# Patient Record
Sex: Female | Born: 1955 | Race: Black or African American | Hispanic: No | Marital: Married | Smoking: Current every day smoker
Health system: Southern US, Community
[De-identification: ages and names within clinical notes are randomized; demographics above are authoritative.]

## PROBLEM LIST (undated history)

## (undated) DIAGNOSIS — E039 Hypothyroidism, unspecified: Secondary | ICD-10-CM

## (undated) DIAGNOSIS — T7840XA Allergy, unspecified, initial encounter: Secondary | ICD-10-CM

## (undated) DIAGNOSIS — E079 Disorder of thyroid, unspecified: Secondary | ICD-10-CM

## (undated) DIAGNOSIS — E7800 Pure hypercholesterolemia, unspecified: Secondary | ICD-10-CM

## (undated) DIAGNOSIS — K219 Gastro-esophageal reflux disease without esophagitis: Secondary | ICD-10-CM

## (undated) DIAGNOSIS — Z9889 Other specified postprocedural states: Secondary | ICD-10-CM

## (undated) DIAGNOSIS — I10 Essential (primary) hypertension: Secondary | ICD-10-CM

## (undated) DIAGNOSIS — R112 Nausea with vomiting, unspecified: Secondary | ICD-10-CM

## (undated) DIAGNOSIS — M1990 Unspecified osteoarthritis, unspecified site: Secondary | ICD-10-CM

## (undated) HISTORY — PX: OTHER SURGICAL HISTORY: SHX169

## (undated) HISTORY — DX: Unspecified osteoarthritis, unspecified site: M19.90

## (undated) HISTORY — DX: Pure hypercholesterolemia, unspecified: E78.00

## (undated) HISTORY — DX: Essential (primary) hypertension: I10

## (undated) HISTORY — PX: BREAST SURGERY: SHX581

## (undated) HISTORY — PX: ABDOMINAL HYSTERECTOMY: SHX81

## (undated) HISTORY — DX: Gastro-esophageal reflux disease without esophagitis: K21.9

## (undated) HISTORY — PX: BACK SURGERY: SHX140

## (undated) HISTORY — DX: Allergy, unspecified, initial encounter: T78.40XA

## (undated) HISTORY — PX: TONSILLECTOMY: SHX5217

## (undated) HISTORY — DX: Disorder of thyroid, unspecified: E07.9

## (undated) HISTORY — PX: APPENDECTOMY: SHX54

---

## 1998-02-12 ENCOUNTER — Emergency Department (HOSPITAL_COMMUNITY): Admission: EM | Admit: 1998-02-12 | Discharge: 1998-02-12 | Payer: Self-pay | Admitting: Emergency Medicine

## 1998-02-26 ENCOUNTER — Inpatient Hospital Stay (HOSPITAL_COMMUNITY): Admission: RE | Admit: 1998-02-26 | Discharge: 1998-02-27 | Payer: Self-pay | Admitting: *Deleted

## 2006-11-08 ENCOUNTER — Observation Stay (HOSPITAL_COMMUNITY): Admission: EM | Admit: 2006-11-08 | Discharge: 2006-11-09 | Payer: Self-pay | Admitting: Emergency Medicine

## 2006-11-16 ENCOUNTER — Ambulatory Visit: Payer: Self-pay

## 2007-04-17 ENCOUNTER — Emergency Department (HOSPITAL_COMMUNITY): Admission: EM | Admit: 2007-04-17 | Discharge: 2007-04-17 | Payer: Self-pay | Admitting: Emergency Medicine

## 2010-11-27 ENCOUNTER — Other Ambulatory Visit: Payer: Self-pay | Admitting: Family Medicine

## 2010-11-27 ENCOUNTER — Ambulatory Visit
Admission: RE | Admit: 2010-11-27 | Discharge: 2010-11-27 | Disposition: A | Discharge status: Home / Self Care | Payer: Managed Care, Other (non HMO) | Source: Ambulatory Visit | Attending: Family Medicine | Admitting: Family Medicine

## 2011-03-13 NOTE — H&P (Signed)
NAME:  KERIE, BADGER NO.:  [id number]   MEDICAL RECORD NO.:  [id number]          PATIENT TYPE:  EMS   LOCATION:  [facility]                         FACILITY:  [facility]   PHYSICIAN:  Mobolaji B. Bakare, M.D.DATE OF BIRTH:  1956/01/29   DATE OF ADMISSION:  11/08/2006  DATE OF DISCHARGE:                              HISTORY & PHYSICAL   PRIMARY CARE PHYSICIAN:  Unassigned.   CHIEF COMPLAINT:  Intermittent chest pain for 1 month.   HISTORY OF PRESENT ILLNESS:  Ms. Chrisandra Carota is a pleasant 55 year old  African American female who has been having intermittent right-sided  chest pain for 1 month which is aggravated by body movement and cough.  These symptoms were preceded by upper respiratory symptoms and she has  been coughing since.  Cough is unproductive of sputum.  She has  pleuritic chest pain on coughing.  Yesterday, pain got worse and she  felt diaphoretic and nauseated, somewhat short of breath and she came to  the emergency room.  On evaluation, her cardiac markers at the point of  care were negative and she had a normal EKG.  She has hypertension and  history of ongoing tobacco abuse; hence, she will be admitted to rule  out myocardial ischemia and further risk stratification.   REVIEW OF SYSTEMS:  There is no abdominal pain, diarrhea, vomiting or  constipation, no dysuria, urgency or increased frequency of micturition.  She has no headaches.   PAST MEDICAL HISTORY:  1. Hypertension.  2. Back pain with herniated disk in 1999.   PAST SURGICAL HISTORY:   MEDICATIONS:  Hydrochlorothiazide; the patient is unsure of the dose.   ALLERGIES:  DARVOCET-N causes vomiting.   FAMILY HISTORY:  Mother passed away from massive heart attack at the age  of 48 and grandmother passed away from a massive heart attack at the age  of 39.   SOCIAL HISTORY:  She smokes cigarettes about 6 cigarrette a day, but  after meals.  She does not drink alcohol.  She is married and has 1  son.   PHYSICAL EXAMINATION:  INITIAL VITALS:  Temperature 97.7, blood pressure  151/88, pulse of 76, respiratory rate of 20 and O2 SATs of 98%.  GENERAL:  On examination, the patient is comfortable, not in respiratory  distress.  HEENT:  Normocephalic, atraumatic head.  Pupils are equal, round and  reactive to light.  Extraocular muscle movement intact.  NECK:  No elevated JVD.  No carotid bruit.  LUNGS:  Clear clinically to auscultation.  CVS:  S1 and S2, regular.  No murmur.  No gallop.  ABDOMEN:  Not distended, soft and nontender.  EXTREMITIES:  No pedal edema or calf tenderness.  Dorsalis pedis pulses  palpable bilaterally.  MUSCULOSKELETAL:  There is reproducible chest wall tenderness over the  right chest wall.  CNS:  No focal neurologic deficit.   ACCESSORY CLINICAL DATA:  EKG shows normal sinus rhythm with heart rate  of 83, nonspecific ST changes.   LABORATORY DATA:  Sodium 134, potassium 3.4, chloride 103, bicarb 24,  glucose 151, BUN 14, creatinine 1.0, calcium 8.8.  Cardiac markers at  the point of care:  Troponin less than 0.05, myoglobin of 161.   RADIOLOGIC FINDINGS:  Chest x-ray showed borderline cardiomegaly without  any acute cardiopulmonary disease.   ASSESSMENT AND PLAN:  Ms. Chrisandra Carota is a 55 year old Caucasian female  presenting with right-sided chest pain which has been ongoing for 1  month.  It became more aggravating yesterday.  This was preceded by  upper respiratory symptoms and cough.  Pain is reproducible on the right  side of chest wall.  EKG is nonspecific.  The patient is hypertensive  and has ongoing tobacco abuse.  She will be admitted to rule out  myocardial infarction and further risk stratification.   ADMISSION DIAGNOSES:  1. Atypical chest pain.  2. Tobacco abuse.  3. Hypertension.   PLAN:  Admit to Telemetry.  Serial cardiac enzymes and cardiac panel q.8  h. x3.  Check D-dimer and if positive, will obtain CT angiogram of  chest.  We  will resume hydrochlorothiazide 25 mg p.o. daily, Vicodin one  to two p.o. q.4 h. p.r.n. pain, aspirin 325 mg p.o. daily and sublingual  nitroglycerin p.r.n.  I am holding off beta blocker at this point, as  the patient's heart rate is in the range of mid 60s.  We will offer  tobacco cessation counseling.  Mucinex 600 mg p.o. b.i.d.      Mobolaji B. Corky Downs, M.D.  Electronically Signed     MBB/MEDQ  D:  11/08/2006  T:  11/08/2006  Job:  [id number]

## 2011-08-12 LAB — COMPREHENSIVE METABOLIC PANEL
ALT: 17
AST: 28
Albumin: 4.5
Alkaline Phosphatase: 46
BUN: 10
CO2: 26
Calcium: 9.8
Chloride: 103
Creatinine, Ser: 1.16
GFR calc Af Amer: 60 — ABNORMAL LOW
GFR calc non Af Amer: 49 — ABNORMAL LOW
Glucose, Bld: 131 — ABNORMAL HIGH
Potassium: 3.3 — ABNORMAL LOW
Sodium: 140
Total Bilirubin: 0.9
Total Protein: 7.8

## 2011-08-12 LAB — URINALYSIS, ROUTINE W REFLEX MICROSCOPIC
Glucose, UA: NEGATIVE
Ketones, ur: 15 — AB
Nitrite: NEGATIVE
Protein, ur: 100 — AB
Specific Gravity, Urine: 1.018
Urobilinogen, UA: 1
pH: 6

## 2011-08-12 LAB — DIFFERENTIAL
Basophils Absolute: 0
Basophils Relative: 0
Eosinophils Absolute: 0.1
Eosinophils Relative: 1
Lymphocytes Relative: 9 — ABNORMAL LOW
Lymphs Abs: 0.9
Monocytes Absolute: 0.5
Monocytes Relative: 5
Neutro Abs: 9.4 — ABNORMAL HIGH
Neutrophils Relative %: 86 — ABNORMAL HIGH

## 2011-08-12 LAB — CBC
HCT: 41.9
Hemoglobin: 13.9
MCHC: 33.2
MCV: 80.7
Platelets: 278
RBC: 5.19 — ABNORMAL HIGH
RDW: 13.9
WBC: 10.9 — ABNORMAL HIGH

## 2011-08-12 LAB — URINE MICROSCOPIC-ADD ON

## 2011-12-30 ENCOUNTER — Ambulatory Visit: Payer: Self-pay | Admitting: Family Medicine

## 2012-02-23 ENCOUNTER — Encounter: Payer: Self-pay | Admitting: Family Medicine

## 2012-02-23 ENCOUNTER — Ambulatory Visit (INDEPENDENT_AMBULATORY_CARE_PROVIDER_SITE_OTHER): Payer: Managed Care, Other (non HMO) | Admitting: Family Medicine

## 2012-02-23 VITALS — BP 159/85 | HR 74 | Temp 98.9°F | Resp 16 | Ht 65.0 in | Wt 206.8 lb

## 2012-02-23 DIAGNOSIS — E669 Obesity, unspecified: Secondary | ICD-10-CM

## 2012-02-23 DIAGNOSIS — R5381 Other malaise: Secondary | ICD-10-CM

## 2012-02-23 DIAGNOSIS — E559 Vitamin D deficiency, unspecified: Secondary | ICD-10-CM

## 2012-02-23 DIAGNOSIS — M5126 Other intervertebral disc displacement, lumbar region: Secondary | ICD-10-CM

## 2012-02-23 DIAGNOSIS — F418 Other specified anxiety disorders: Secondary | ICD-10-CM

## 2012-02-23 DIAGNOSIS — M5136 Other intervertebral disc degeneration, lumbar region: Secondary | ICD-10-CM

## 2012-02-23 DIAGNOSIS — E785 Hyperlipidemia, unspecified: Secondary | ICD-10-CM

## 2012-02-23 DIAGNOSIS — J309 Allergic rhinitis, unspecified: Secondary | ICD-10-CM

## 2012-02-23 DIAGNOSIS — I10 Essential (primary) hypertension: Secondary | ICD-10-CM

## 2012-02-23 DIAGNOSIS — K219 Gastro-esophageal reflux disease without esophagitis: Secondary | ICD-10-CM

## 2012-02-23 DIAGNOSIS — E039 Hypothyroidism, unspecified: Secondary | ICD-10-CM

## 2012-02-23 DIAGNOSIS — R5383 Other fatigue: Secondary | ICD-10-CM

## 2012-02-23 LAB — LDL CHOLESTEROL, DIRECT: Direct LDL: 157 mg/dL — ABNORMAL HIGH

## 2012-02-23 LAB — TSH: TSH: 7.969 u[IU]/mL — ABNORMAL HIGH (ref 0.350–4.500)

## 2012-02-23 MED ORDER — LISINOPRIL-HYDROCHLOROTHIAZIDE 10-12.5 MG PO TABS: 1.0000 | ORAL_TABLET | Freq: Every day | ORAL | Status: DC

## 2012-02-23 MED ORDER — LEVOTHYROXINE SODIUM 50 MCG PO TABS: 50.0000 ug | ORAL_TABLET | Freq: Every day | ORAL | Status: DC

## 2012-02-23 MED ORDER — OMEPRAZOLE 20 MG PO CPDR: 20.0000 mg | DELAYED_RELEASE_CAPSULE | Freq: Every day | ORAL | Status: DC

## 2012-02-23 NOTE — Progress Notes (Signed)
  Subjective:    Patient ID: Erika Cervantes, female    DOB: [birth year], 56 y.o.   MRN: [id number]  HPI  This pleasant AA female has HTN and is out of medication since January 2013. Significant  stress in personal life and found out that adult son has Type II Diabetes. She cares for her elderly  aunt  (age 21) who has Down's Syndrome and  Dementia. She has little help from family on a  consistent basis. She c/o sinus drainage and is taking Claritin-D for last week; has a lot of AM phlegm  with cough and sinus congestion.    Review of Systems  Constitutional: Positive for fatigue. Negative for fever and chills.       Not sleeping well- < 7 hours sleep most nights  HENT: Positive for congestion, sore throat, postnasal drip and sinus pressure. Negative for ear pain and nosebleeds.   Eyes: Positive for discharge.  Respiratory: Positive for cough. Negative for choking, chest tightness and wheezing.   Cardiovascular: Negative for chest pain and palpitations.  Neurological: Positive for headaches. Negative for dizziness, syncope and weakness.  Psychiatric/Behavioral: Positive for sleep disturbance.       Objective:   Physical Exam  Nursing note and vitals reviewed. Constitutional: She is oriented to person, place, and time. She appears well-developed and well-nourished.  HENT:  Head: Normocephalic and atraumatic.  Right Ear: External ear normal.  Left Ear: External ear normal.  Nose: Nose normal.  Mouth/Throat: Oropharynx is clear and moist.       Posterior pharynx with cobblestoning and redness. SInuses nontender with percussion  Eyes: EOM are normal. Right eye exhibits no discharge. Left eye exhibits no discharge. No scleral icterus.       Conj injected  Neck: Normal range of motion. Neck supple.  Cardiovascular: Normal rate, regular rhythm and normal heart sounds.  Exam reveals no gallop.   No murmur heard. Pulmonary/Chest: Effort normal and breath sounds normal. No respiratory  distress. She has no wheezes.  Musculoskeletal:       Lower ext: trace edema  Lymphadenopathy:    She has cervical adenopathy.  Neurological: She is alert and oriented to person, place, and time. No cranial nerve deficit.  Skin: Skin is warm and dry.  Psychiatric: Her behavior is normal. Thought content normal. Her mood appears not anxious. Cognition and memory are normal. She exhibits a depressed mood.          Assessment & Plan:   1. Hypothyroidism  TSH; WU:[id number]  2. Fatigue  Vitamin D, 25-hydroxy  3. Hyperlipidemia with target LDL less than 100  LDL Cholesterol, Direct  4. Allergic rhinitis due to allergen    5. HTN (hypertension)  RF: Lisinopril/HCTZ

## 2012-02-23 NOTE — Patient Instructions (Signed)
Allergic Rhinitis  Allergic rhinitis is when the mucous membranes in the nose respond to allergens. Allergens are particles in the air that cause your body to have an allergic reaction. This causes you to release allergic antibodies. Through a chain of events, these eventually cause you to release histamine into the blood stream (hence the use of antihistamines). Although meant to be protective to the body, it is this release that causes your discomfort, such as frequent sneezing, congestion and an itchy runny nose.    CAUSES    The pollen allergens may come from grasses, trees, and weeds. This is seasonal allergic rhinitis, or "hay fever." Other allergens cause year-round allergic rhinitis (perennial allergic rhinitis) such as house dust mite allergen, pet dander and mold spores.    SYMPTOMS     Nasal stuffiness (congestion).   Runny, itchy nose with sneezing and tearing of the eyes.   There is often an itching of the mouth, eyes and ears.  It cannot be cured, but it can be controlled with medications.  DIAGNOSIS    If you are unable to determine the offending allergen, skin or blood testing may find it.  TREATMENT     Avoid the allergen.   Medications and allergy shots (immunotherapy) can help.   Hay fever may often be treated with antihistamines in pill or nasal spray forms. Antihistamines block the effects of histamine. There are over-the-counter medicines that may help with nasal congestion and swelling around the eyes. Check with your caregiver before taking or giving this medicine.  If the treatment above does not work, there are many new medications your caregiver can prescribe. Stronger medications may be used if initial measures are ineffective. Desensitizing injections can be used if medications and avoidance fails. Desensitization is when a patient is given ongoing shots until the body becomes less sensitive to the allergen. Make sure you follow up with your caregiver if problems continue.  SEEK  MEDICAL CARE IF:     You develop fever (more than 100.5 F (38.1 C).   You develop a cough that does not stop easily (persistent).   You have shortness of breath.   You start wheezing.   Symptoms interfere with normal daily activities.  Document Released: 07/07/2001 Document Revised: 10/01/2011 Document Reviewed: 01/16/2009  Exit[employer] Patient Information 2012 [employer].

## 2012-02-24 LAB — VITAMIN D 25 HYDROXY (VIT D DEFICIENCY, FRACTURES): Vit D, 25-Hydroxy: 13 ng/mL — ABNORMAL LOW (ref 30–89)

## 2012-02-26 ENCOUNTER — Encounter: Payer: Self-pay | Admitting: Family Medicine

## 2012-02-26 DIAGNOSIS — E669 Obesity, unspecified: Secondary | ICD-10-CM | POA: Insufficient documentation

## 2012-02-26 DIAGNOSIS — E785 Hyperlipidemia, unspecified: Secondary | ICD-10-CM | POA: Insufficient documentation

## 2012-02-26 DIAGNOSIS — F418 Other specified anxiety disorders: Secondary | ICD-10-CM | POA: Insufficient documentation

## 2012-02-26 DIAGNOSIS — E039 Hypothyroidism, unspecified: Secondary | ICD-10-CM | POA: Insufficient documentation

## 2012-02-26 DIAGNOSIS — M5126 Other intervertebral disc displacement, lumbar region: Secondary | ICD-10-CM | POA: Insufficient documentation

## 2012-02-26 DIAGNOSIS — I10 Essential (primary) hypertension: Secondary | ICD-10-CM | POA: Insufficient documentation

## 2012-02-26 DIAGNOSIS — M5136 Other intervertebral disc degeneration, lumbar region: Secondary | ICD-10-CM | POA: Insufficient documentation

## 2012-02-26 DIAGNOSIS — K219 Gastro-esophageal reflux disease without esophagitis: Secondary | ICD-10-CM | POA: Insufficient documentation

## 2012-02-26 DIAGNOSIS — J309 Allergic rhinitis, unspecified: Secondary | ICD-10-CM | POA: Insufficient documentation

## 2012-02-27 ENCOUNTER — Other Ambulatory Visit: Payer: Self-pay | Admitting: Family Medicine

## 2012-02-27 MED ORDER — ERGOCALCIFEROL 1.25 MG (50000 UT) PO CAPS: 50000.0000 [IU] | ORAL_CAPSULE | ORAL | Status: DC

## 2012-02-27 NOTE — Progress Notes (Signed)
Quick Note:  Please call pt and advise that the following labs are abnormal...  Thyroid test was abnormal but you had not been taking your med for 3 months; now that you have your medication, take it daily and you should feel better. Thyroid tests will be checked at your next visit. I will add additional refills for your medication so you do not run out!    You have a low Vitamin D level and will need to take a supplement to get it up to normal range. This med is at your pharmacy. Take the Vitamin D capsule once a week ( be sure pt understands this). This will also be rechecked at your next visit. LDL("bad") cholesterol is too high; try to eat healthier- no fried foods, limit high fat foods and processed foods and eat more fruits and vegetables.  Copt to pt. ______

## 2012-03-09 ENCOUNTER — Encounter: Payer: Self-pay | Admitting: Family Medicine

## 2012-05-24 ENCOUNTER — Ambulatory Visit: Payer: Managed Care, Other (non HMO) | Admitting: Family Medicine

## 2012-05-26 ENCOUNTER — Ambulatory Visit (INDEPENDENT_AMBULATORY_CARE_PROVIDER_SITE_OTHER): Payer: Managed Care, Other (non HMO) | Admitting: Family Medicine

## 2012-05-26 ENCOUNTER — Encounter: Payer: Self-pay | Admitting: Family Medicine

## 2012-05-26 VITALS — BP 133/79 | HR 91 | Temp 97.7°F | Resp 16 | Ht 65.0 in | Wt 207.0 lb

## 2012-05-26 DIAGNOSIS — M2550 Pain in unspecified joint: Secondary | ICD-10-CM

## 2012-05-26 DIAGNOSIS — E039 Hypothyroidism, unspecified: Secondary | ICD-10-CM

## 2012-05-26 DIAGNOSIS — E559 Vitamin D deficiency, unspecified: Secondary | ICD-10-CM

## 2012-05-26 DIAGNOSIS — Z23 Encounter for immunization: Secondary | ICD-10-CM

## 2012-05-26 DIAGNOSIS — I10 Essential (primary) hypertension: Secondary | ICD-10-CM

## 2012-05-26 DIAGNOSIS — R5381 Other malaise: Secondary | ICD-10-CM

## 2012-05-26 DIAGNOSIS — R5383 Other fatigue: Secondary | ICD-10-CM

## 2012-05-26 LAB — BASIC METABOLIC PANEL
BUN: 14 mg/dL (ref 6–23)
CO2: 24 mEq/L (ref 19–32)
Calcium: 10.1 mg/dL (ref 8.4–10.5)
Chloride: 102 mEq/L (ref 96–112)
Creat: 1.04 mg/dL (ref 0.50–1.10)
Glucose, Bld: 91 mg/dL (ref 70–99)
Potassium: 4.1 mEq/L (ref 3.5–5.3)
Sodium: 136 mEq/L (ref 135–145)

## 2012-05-26 LAB — CBC WITH DIFFERENTIAL/PLATELET
Basophils Absolute: 0.1 10*3/uL (ref 0.0–0.1)
Basophils Relative: 1 % (ref 0–1)
Eosinophils Absolute: 0.3 10*3/uL (ref 0.0–0.7)
Eosinophils Relative: 4 % (ref 0–5)
HCT: 36.1 % (ref 36.0–46.0)
Hemoglobin: 12.6 g/dL (ref 12.0–15.0)
Lymphocytes Relative: 39 % (ref 12–46)
Lymphs Abs: 2.7 10*3/uL (ref 0.7–4.0)
MCH: 26.8 pg (ref 26.0–34.0)
MCHC: 34.9 g/dL (ref 30.0–36.0)
MCV: 76.6 fL — ABNORMAL LOW (ref 78.0–100.0)
Monocytes Absolute: 0.4 10*3/uL (ref 0.1–1.0)
Monocytes Relative: 6 % (ref 3–12)
Neutro Abs: 3.4 10*3/uL (ref 1.7–7.7)
Neutrophils Relative %: 50 % (ref 43–77)
Platelets: 314 10*3/uL (ref 150–400)
RBC: 4.71 MIL/uL (ref 3.87–5.11)
RDW: 14.3 % (ref 11.5–15.5)
WBC: 6.8 10*3/uL (ref 4.0–10.5)

## 2012-05-26 LAB — TSH: TSH: 5.085 u[IU]/mL — ABNORMAL HIGH (ref 0.350–4.500)

## 2012-05-26 NOTE — Patient Instructions (Addendum)

## 2012-05-26 NOTE — Progress Notes (Signed)
  Subjective:    Patient ID: Erika Cervantes, female    DOB: 27-Apr-1956, 56 y.o.   MRN: [id number]  HPI This 56 y.o. AA female has thyroid disorder, on replacement and HTN as well as chronic joint  pain and LBP (bulging disc). She is compliant with meds without side effects.    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  She still has some sleep disturbance but denies fever, unexplained weight loss or gain,  CP or heaviness, SOB, cough,  generalized myalgias, edema or weakness. She notes  subcutaneous skin nodules on her legs only. She c/o decreased/ absent libido.    Cardiac risk factors: Postmenopausal female, hyperlipidemia, former smoker, HTN,    Family history -mother died of massive MI             Review of Systems  Constitutional: Positive for fatigue. Negative for fever, appetite change and unexpected weight change.  HENT: Positive for neck stiffness. Negative for sore throat and trouble swallowing.   Eyes: Negative for visual disturbance.  Respiratory: Negative.   Cardiovascular: Negative.   Musculoskeletal: Positive for back pain. Negative for myalgias, joint swelling and arthralgias.  Neurological: Negative.   Psychiatric/Behavioral: Positive for disturbed wake/sleep cycle. The patient is not nervous/anxious.        Objective:   Physical Exam  Nursing note and vitals reviewed. Constitutional: She is oriented to person, place, and time. She appears well-developed and well-nourished. No distress.  HENT:  Head: Normocephalic and atraumatic.  Eyes: Conjunctivae and EOM are normal. No scleral icterus.  Cardiovascular: Normal rate, regular rhythm and normal heart sounds.  Exam reveals no gallop and no friction rub.   No murmur heard. Pulmonary/Chest: Effort normal and breath sounds normal. No respiratory distress. She has no wheezes.  Musculoskeletal: She exhibits no edema and no tenderness.  Neurological: She is alert and oriented  to person, place, and time. No cranial nerve deficit.  Skin: Skin is warm and dry.       Lower ext: soft 1-1.5 cm subcutaneous nodules, no ulcerations  Psychiatric:       Flat affect, depressed mood   ECG 04/08/2012) reviewed: Normal variant     Assessment & Plan:   1. Unspecified hypothyroidism  TSH Continue Current dose Levothyroxine  2. Unspecified vitamin D deficiency  Vitamin D, 25-hydroxy Continue Vit D 50,000 IU supplement  3. Joint pain / subcutaneous nodules Rheumatoid factor  4. HTN (hypertension)  Basic metabolic panel; Continue current medication  5. Fatigue - mild anemia in Feb 2012 Suspect thyroid under-supplementation CBC with Differential  6. Need for prophylactic vaccination with combined diphtheria-tetanus-pertussis (DTP) vaccine  Tdap vaccine greater than or equal to 7yo IM   Pt has been advised that if fatigue persists, a cardiac evaluation will be considered.

## 2012-05-27 LAB — RHEUMATOID FACTOR: Rhuematoid fact SerPl-aCnc: <10 IU/mL (ref <=14)

## 2012-05-27 LAB — VITAMIN D 25 HYDROXY (VIT D DEFICIENCY, FRACTURES): Vit D, 25-Hydroxy: 39 ng/mL (ref 30–89)

## 2012-05-30 NOTE — Progress Notes (Signed)
Quick Note:  Please call pt and advise that the following labs are abnormal... Thyroid result is close to normal; no need to change medication at this time. I might recheck you thyroid test at next visit. Vitamin D level is now in the low normal range. Continue your Vitamin D supplement. Other labs are normal. See you in 2 months.  Copy to pt. ______

## 2012-07-13 ENCOUNTER — Other Ambulatory Visit: Payer: Self-pay | Admitting: Family Medicine

## 2012-07-28 ENCOUNTER — Ambulatory Visit: Payer: Managed Care, Other (non HMO) | Admitting: Family Medicine

## 2012-09-24 ENCOUNTER — Ambulatory Visit (INDEPENDENT_AMBULATORY_CARE_PROVIDER_SITE_OTHER): Payer: Managed Care, Other (non HMO) | Admitting: Emergency Medicine

## 2012-09-24 VITALS — BP 115/72 | HR 71 | Temp 98.1°F | Resp 16 | Ht 66.5 in | Wt 218.0 lb

## 2012-09-24 DIAGNOSIS — J209 Acute bronchitis, unspecified: Secondary | ICD-10-CM

## 2012-09-24 DIAGNOSIS — J018 Other acute sinusitis: Secondary | ICD-10-CM

## 2012-09-24 DIAGNOSIS — I10 Essential (primary) hypertension: Secondary | ICD-10-CM

## 2012-09-24 LAB — BASIC METABOLIC PANEL
BUN: 15 mg/dL (ref 6–23)
CO2: 25 mEq/L (ref 19–32)
Calcium: 9.7 mg/dL (ref 8.4–10.5)
Chloride: 106 mEq/L (ref 96–112)
Creat: 1.03 mg/dL (ref 0.50–1.10)
Glucose, Bld: 91 mg/dL (ref 70–99)
Potassium: 4.5 mEq/L (ref 3.5–5.3)
Sodium: 139 mEq/L (ref 135–145)

## 2012-09-24 MED ORDER — AMOXICILLIN-POT CLAVULANATE 875-125 MG PO TABS: 1.0000 | ORAL_TABLET | Freq: Two times a day (BID) | ORAL | Status: DC

## 2012-09-24 MED ORDER — HYDROCOD POLST-CHLORPHEN POLST 10-8 MG/5ML PO LQCR: 5.0000 mL | Freq: Two times a day (BID) | ORAL | Status: DC | PRN

## 2012-09-24 MED ORDER — PSEUDOEPHEDRINE-GUAIFENESIN ER 60-600 MG PO TB12: 1.0000 | ORAL_TABLET | Freq: Two times a day (BID) | ORAL | Status: AC

## 2012-09-24 NOTE — Progress Notes (Signed)
[facility] [address] [place] [address] [phone]  Date:  09/24/2012   Name:  Erika Cervantes   DOB:  1955-10-29   MRN:  [id number]  PCP:  No primary provider on file.    Chief Complaint: Nasal Congestion and Numbness   History of Present Illness:  Erika Cervantes is a 56 y.o. very pleasant female patient who presents with the following:  Ill with a "cold" for two weeks.  The end of last week developed increased nasal congestion and a productive cough with purulent thick sputum.  Has mucoid nasal drainage and post nasal drip and nasal congestion.  No fever or chills, says she wheezes at night.  Cough is worse at night.  No shortness of breath, nausea or vomiting.  No stool change.    Two days ago started to experience cramps in her legs.  On low dose of HCTZ.  No edema or history of injury or overuse.   Patient Active Problem List  Diagnosis  . Hypothyroidism  . Hyperlipidemia with target LDL less than 100  . Allergic rhinitis due to allergen  . HTN (hypertension)  . Obesity (BMI 30.0-34.9)  . Situational anxiety  . Bulging lumbar disc  . GERD (gastroesophageal reflux disease)  . Vitamin d deficiency    No past medical history on file.  Past Surgical History  Procedure Date  . Colon surgery   . Tonsillectomy   . Abdominal hysterectomy     Fibroids  . Breast surgery     Right breast mass  . Hammer toes     History  Substance Use Topics  . Smoking status: Former Games developer  . Smokeless tobacco: Not on file  . Alcohol Use: No    Family History  Problem Relation Age of Onset  . Heart disease Mother     Allergies  Allergen Reactions  . Darvocet (Propoxyphene-Acetaminophen)     vomit    Medication list has been reviewed and updated.  Current Outpatient Prescriptions on File Prior to Visit  Medication Sig Dispense Refill  . levothyroxine (SYNTHROID, LEVOTHROID) 50 MCG tablet TAKE 1 TABLET (50 MCG TOTAL) BY MOUTH DAILY.  30 tablet  1   . lisinopril-hydrochlorothiazide (PRINZIDE,ZESTORETIC) 10-12.5 MG per tablet Take 1 tablet by mouth daily.  90 tablet  3  . omeprazole (PRILOSEC) 20 MG capsule Take 1 capsule (20 mg total) by mouth daily.  30 capsule  5  . Vitamin D, Ergocalciferol, (DRISDOL) 50000 UNITS CAPS TAKE 1 CAPSULE (50,000 UNITS TOTAL) BY MOUTH ONCE A WEEK.  4 capsule  1    Review of Systems:  As per HPI, otherwise negative.     Physical Examination: Filed Vitals:   09/24/12 1333  BP: 115/72  Pulse: 71  Temp: 98.1 F (36.7 C)  Resp: 16   Filed Vitals:   09/24/12 1333  Height: 5' 6.5" (1.689 m)  Weight: 218 lb (98.884 kg)   Body mass index is 34.66 kg/(m^2). Ideal Body Weight: Weight in (lb) to have BMI = 25: 156.9  GEN: obese, NAD, Non-toxic, A & O x 3  Marked nasal congestion.  No shortness of breath or rash HEENT: Atraumatic, Normocephalic. Neck supple. No masses, No LAD.  Oropharynx negative Ears and Nose: No external deformity.  TM negative CV: RRR, No M/G/R. No JVD. No thrill. No extra heart sounds. PULM: CTA B, no wheezes, crackles, rhonchi. No retractions. No resp. distress. No accessory muscle use. ABD: S, NT, ND, +BS.  No rebound. No HSM. EXTR: No c/c/e NEURO Normal gait.  PSYCH: Normally interactive. Conversant. Not depressed or anxious appearing.  Calm demeanor.    Assessment and Plan: Sinusitis Bronchitis augmentin mucinex d tussionex Follow up as needed  Carmelina Dane, MD

## 2013-01-08 NOTE — Patient Instructions (Addendum)

## 2013-01-18 NOTE — Progress Notes (Signed)
Unable to close this chart  due to smart set still being open

## 2013-01-18 NOTE — Progress Notes (Signed)
Reviewed and agree.

## 2013-02-25 ENCOUNTER — Ambulatory Visit: Payer: Managed Care, Other (non HMO)

## 2013-02-25 ENCOUNTER — Ambulatory Visit (INDEPENDENT_AMBULATORY_CARE_PROVIDER_SITE_OTHER): Payer: Managed Care, Other (non HMO) | Admitting: Family Medicine

## 2013-02-25 VITALS — BP 160/82 | HR 107 | Temp 98.8°F | Resp 16 | Ht 66.0 in | Wt 238.0 lb

## 2013-02-25 DIAGNOSIS — R05 Cough: Secondary | ICD-10-CM

## 2013-02-25 DIAGNOSIS — R062 Wheezing: Secondary | ICD-10-CM

## 2013-02-25 DIAGNOSIS — J209 Acute bronchitis, unspecified: Secondary | ICD-10-CM

## 2013-02-25 DIAGNOSIS — I10 Essential (primary) hypertension: Secondary | ICD-10-CM

## 2013-02-25 DIAGNOSIS — R059 Cough, unspecified: Secondary | ICD-10-CM

## 2013-02-25 DIAGNOSIS — K219 Gastro-esophageal reflux disease without esophagitis: Secondary | ICD-10-CM

## 2013-02-25 LAB — POCT CBC
Granulocyte percent: 66.2 %G (ref 37–80)
HCT, POC: 38.7 % (ref 37.7–47.9)
Hemoglobin: 12.3 g/dL (ref 12.2–16.2)
Lymph, poc: 2.1 (ref 0.6–3.4)
MCH, POC: 26.7 pg — AB (ref 27–31.2)
MCHC: 31.8 g/dL (ref 31.8–35.4)
MCV: 84 fL (ref 80–97)
MID (cbc): 0.6 (ref 0–0.9)
MPV: 9.9 fL (ref 0–99.8)
POC Granulocyte: 5.4 (ref 2–6.9)
POC LYMPH PERCENT: 25.9 %L (ref 10–50)
POC MID %: 7.9 %M (ref 0–12)
Platelet Count, POC: 307 10*3/uL (ref 142–424)
RBC: 4.61 M/uL (ref 4.04–5.48)
RDW, POC: 14.8 %
WBC: 8.2 10*3/uL (ref 4.6–10.2)

## 2013-02-25 LAB — COMPREHENSIVE METABOLIC PANEL
ALT: 9 U/L (ref 0–35)
AST: 17 U/L (ref 0–37)
Albumin: 4.5 g/dL (ref 3.5–5.2)
Alkaline Phosphatase: 42 U/L (ref 39–117)
BUN: 10 mg/dL (ref 6–23)
CO2: 25 mEq/L (ref 19–32)
Calcium: 9.9 mg/dL (ref 8.4–10.5)
Chloride: 105 mEq/L (ref 96–112)
Creat: 1.18 mg/dL — ABNORMAL HIGH (ref 0.50–1.10)
Glucose, Bld: 104 mg/dL — ABNORMAL HIGH (ref 70–99)
Potassium: 4 mEq/L (ref 3.5–5.3)
Sodium: 138 mEq/L (ref 135–145)
Total Bilirubin: 0.4 mg/dL (ref 0.3–1.2)
Total Protein: 8.1 g/dL (ref 6.0–8.3)

## 2013-02-25 MED ORDER — IPRATROPIUM BROMIDE 0.02 % IN SOLN
0.5000 mg | Freq: Once | RESPIRATORY_TRACT | Status: AC
  Administered 2013-02-25: 0.5 mg via RESPIRATORY_TRACT

## 2013-02-25 MED ORDER — ALBUTEROL SULFATE (2.5 MG/3ML) 0.083% IN NEBU
2.5000 mg | INHALATION_SOLUTION | Freq: Once | RESPIRATORY_TRACT | Status: AC
  Administered 2013-02-25: 2.5 mg via RESPIRATORY_TRACT

## 2013-02-25 MED ORDER — METHYLPREDNISOLONE 4 MG PO KIT: PACK | ORAL | Status: DC

## 2013-02-25 MED ORDER — HYDROCODONE-HOMATROPINE 5-1.5 MG/5ML PO SYRP: 5.0000 mL | ORAL_SOLUTION | Freq: Every evening | ORAL | Status: DC | PRN

## 2013-02-25 MED ORDER — AZITHROMYCIN 250 MG PO TABS: ORAL_TABLET | ORAL | Status: DC

## 2013-02-25 MED ORDER — IPRATROPIUM-ALBUTEROL 20-100 MCG/ACT IN AERS: 1.0000 | INHALATION_SPRAY | Freq: Four times a day (QID) | RESPIRATORY_TRACT | Status: DC | PRN

## 2013-02-25 MED ORDER — OMEPRAZOLE 20 MG PO CPDR: 20.0000 mg | DELAYED_RELEASE_CAPSULE | Freq: Every day | ORAL | Status: DC

## 2013-02-25 NOTE — Progress Notes (Signed)
[facility]:  Office Visit  Chief Complaint:  Chief Complaint  Patient presents with  . Cough  . Nasal Congestion  . Otalgia  . Headache  . Laryngitis    HPI: Erika Cervantes is a 57 y.o. female who complains of  1 week history of coughing, intermittent  Yellow/white sputum production. She took Mucinex D without relief. Has had allergies. Sore throat. Denies fevers, chills. Nonsmoker. Used to smoke. She has wheezing. Has been at [facility] for goddaughter who was in ICU on and on since for last 3 months. Not UTD on flu or PNA vaccines  HTN-doing well. Has been taking her meds more regular. No SEs.  No cough with BP meds. No leg swelling.  History reviewed. No pertinent past medical history. Past Surgical History  Procedure Laterality Date  . Colon surgery    . Tonsillectomy    . Abdominal hysterectomy      Fibroids  . Breast surgery      Right breast mass  . Hammer toes     History   Social History  . Marital Status: Married    Spouse Name: N/A    Number of Children: N/A  . Years of Education: N/A   Social History Main Topics  . Smoking status: Former Games developer  . Smokeless tobacco: None  . Alcohol Use: No  . Drug Use: No  . Sexually Active: None   Other Topics Concern  . None   Social History Narrative  . None   Family History  Problem Relation Age of Onset  . Heart disease Mother    Allergies  Allergen Reactions  . Darvocet (Propoxyphene-Acetaminophen)     vomit   Prior to Admission medications   Medication Sig Start Date End Date Taking? Authorizing Provider  lisinopril-hydrochlorothiazide (PRINZIDE,ZESTORETIC) 10-12.5 MG per tablet Take 1 tablet by mouth daily. 02/23/12  Yes Maurice March, MD  pseudoephedrine-guaifenesin [facility] D) 60-600 MG per tablet Take 1 tablet by mouth every 12 (twelve) hours. 09/24/12 09/24/13 Yes Phillips Odor, MD  amoxicillin-clavulanate (AUGMENTIN) 875-125 MG per tablet Take 1 tablet by mouth 2  (two) times daily. 09/24/12   Phillips Odor, MD  chlorpheniramine-HYDROcodone (TUSSIONEX PENNKINETIC ER) 10-8 MG/5ML LQCR Take 5 mLs by mouth every 12 (twelve) hours as needed (cough). 09/24/12   Phillips Odor, MD  levothyroxine (SYNTHROID, LEVOTHROID) 50 MCG tablet TAKE 1 TABLET (50 MCG TOTAL) BY MOUTH DAILY. 07/13/12   Heather Jaquita Rector, PA-C  omeprazole (PRILOSEC) 20 MG capsule Take 1 capsule (20 mg total) by mouth daily. 02/23/12   Maurice March, MD  Vitamin D, Ergocalciferol, (DRISDOL) 50000 UNITS CAPS TAKE 1 CAPSULE (50,000 UNITS TOTAL) BY MOUTH ONCE A WEEK. 07/13/12   Nelva Nay, PA-C     ROS: The patient denies fevers, chills, night sweats, unintentional weight loss, chest pain, palpitations, nausea, vomiting, abdominal pain, dysuria, hematuria, melena, numbness, weakness, or tingling.   All other systems have been reviewed and were otherwise negative with the exception of those mentioned in the HPI and as above.    PHYSICAL EXAM: Filed Vitals:   02/25/13 1415  BP: 160/82  Pulse: 107  Temp: 98.8 F (37.1 C)  Resp: 16   Filed Vitals:   02/25/13 1415  Height: 5\' 6"  (1.676 m)  Weight: 238 lb (107.956 kg)   Body mass index is 38.43 kg/(m^2).  General: Alert, no acute distress HEENT:  Normocephalic, atraumatic, oropharynx patent. No exudates, TM nl.  Cardiovascular:  Regular rate  and rhythm, no rubs murmurs or gallops.  No Carotid bruits, radial pulse intact. No pedal edema.  Respiratory: Clear to auscultation bilaterally.  +wheezes, + decrease airflow No rales, or rhonchi.  No cyanosis, no use of accessory musculature GI: No organomegaly, abdomen is soft and non-tender, positive bowel sounds.  No masses. Skin: No rashes. Neurologic: Facial musculature symmetric. Psychiatric: Patient is appropriate throughout our interaction. Lymphatic: No cervical lymphadenopathy Musculoskeletal: Gait intact.   LABS: Results for orders placed in visit on 02/25/13  POCT CBC       Result Value Range   WBC 8.2  4.6 - 10.2 K/uL   Lymph, poc 2.1  0.6 - 3.4   POC LYMPH PERCENT 25.9  10 - 50 %L   MID (cbc) 0.6  0 - 0.9   POC MID % 7.9  0 - 12 %M   POC Granulocyte 5.4  2 - 6.9   Granulocyte percent 66.2  37 - 80 %G   RBC 4.61  4.04 - 5.48 M/uL   Hemoglobin 12.3  12.2 - 16.2 g/dL   HCT, POC 16.1  09.6 - 47.9 %   MCV 84.0  80 - 97 fL   MCH, POC 26.7 (*) 27 - 31.2 pg   MCHC 31.8  31.8 - 35.4 g/dL   RDW, POC 04.5     Platelet Count, POC 307  142 - 424 K/uL   MPV 9.9  0 - 99.8 fL     EKG/XRAY:   Primary read interpreted by Dr. Conley Rolls at [facility]. + bronchitic changes and increase vascular markings, slight cardiomegaly No pneumothorax, no effusion    ASSESSMENT/PLAN: Encounter Diagnoses  Name Primary?  . Wheezing   . Cough   . HTN (hypertension)   . GERD (gastroesophageal reflux disease)   . Acute bronchitis Yes   Lungs were much improved in terms of wheezing and breath sounds after neb treatment Post peakflow-400 Rx Azithromycin Rx Hydromet Rx Combivent Resp INH, Medrol Dose Pack  C/w Mucinex but advise to take without the D Refilled HTN meds Return when she is not sick to get  TSH since she was told not to take synthroid anymore   Jennah Satchell PHUONG, DO 02/25/2013 3:22 PM

## 2013-02-25 NOTE — Patient Instructions (Signed)
Hypertension As your heart beats, it forces blood through your arteries. This force is your blood pressure. If the pressure is too high, it is called hypertension (HTN) or high blood pressure. HTN is dangerous because you may have it and not know it. High blood pressure may mean that your heart has to work harder to pump blood. Your arteries may be narrow or stiff. The extra work puts you at risk for heart disease, stroke, and other problems.  Blood pressure consists of two numbers, a higher number over a lower, 110/72, for example. It is stated as "110 over 72." The ideal is below 120 for the top number (systolic) and under 80 for the bottom (diastolic). Write down your blood pressure today. You should pay close attention to your blood pressure if you have certain conditions such as:  Heart failure.  Prior heart attack.  Diabetes  Chronic kidney disease.  Prior stroke.  Multiple risk factors for heart disease. To see if you have HTN, your blood pressure should be measured while you are seated with your arm held at the level of the heart. It should be measured at least twice. A one-time elevated blood pressure reading (especially in the Emergency Department) does not mean that you need treatment. There may be conditions in which the blood pressure is different between your right and left arms. It is important to see your caregiver soon for a recheck. Most people have essential hypertension which means that there is not a specific cause. This type of high blood pressure may be lowered by changing lifestyle factors such as:  Stress.  Smoking.  Lack of exercise.  Excessive weight.  Drug/tobacco/alcohol use.  Eating less salt. Most people do not have symptoms from high blood pressure until it has caused damage to the body. Effective treatment can often prevent, delay or reduce that damage. TREATMENT  When a cause has been identified, treatment for high blood pressure is directed at the  cause. There are a large number of medications to treat HTN. These fall into several categories, and your caregiver will help you select the medicines that are best for you. Medications may have side effects. You should review side effects with your caregiver. If your blood pressure stays high after you have made lifestyle changes or started on medicines,   Your medication(s) may need to be changed.  Other problems may need to be addressed.  Be certain you understand your prescriptions, and know how and when to take your medicine.  Be sure to follow up with your caregiver within the time frame advised (usually within two weeks) to have your blood pressure rechecked and to review your medications.  If you are taking more than one medicine to lower your blood pressure, make sure you know how and at what times they should be taken. Taking two medicines at the same time can result in blood pressure that is too low. SEEK IMMEDIATE MEDICAL CARE IF:  You develop a severe headache, blurred or changing vision, or confusion.  You have unusual weakness or numbness, or a faint feeling.  You have severe chest or abdominal pain, vomiting, or breathing problems. MAKE SURE YOU:   Understand these instructions.  Will watch your condition.  Will get help right away if you are not doing well or get worse. Document Released: 10/12/2005 Document Revised: 01/04/2012 Document Reviewed: 06/01/2008 Exit[employer] Patient Information 2013 [employer] DASH Diet The DASH diet stands for "Dietary Approaches to Stop Hypertension." It is a healthy   eating plan that has been shown to reduce high blood pressure (hypertension) in as little as 14 days, while also possibly providing other significant health benefits. These other health benefits include reducing the risk of breast cancer after menopause and reducing the risk of type 2 diabetes, heart disease, colon cancer, and stroke. Health benefits also include weight loss  and slowing kidney failure in patients with chronic kidney disease.  DIET GUIDELINES  Limit salt (sodium). Your diet should contain less than 1500 mg of sodium daily.  Limit refined or processed carbohydrates. Your diet should include mostly whole grains. Desserts and added sugars should be used sparingly.  Include small amounts of heart-healthy fats. These types of fats include nuts, oils, and tub margarine. Limit saturated and trans fats. These fats have been shown to be harmful in the body. CHOOSING FOODS  The following food groups are based on a 2000 calorie diet. See your Registered Dietitian for individual calorie needs. Grains and Grain Products (6 to 8 servings daily)  Eat More Often: Whole-wheat bread, brown rice, whole-grain or wheat pasta, quinoa, popcorn without added fat or salt (air popped).  Eat Less Often: White bread, white pasta, white rice, cornbread. Vegetables (4 to 5 servings daily)  Eat More Often: Fresh, frozen, and canned vegetables. Vegetables may be raw, steamed, roasted, or grilled with a minimal amount of fat.  Eat Less Often/Avoid: Creamed or fried vegetables. Vegetables in a cheese sauce. Fruit (4 to 5 servings daily)  Eat More Often: All fresh, canned (in natural juice), or frozen fruits. Dried fruits without added sugar. One hundred percent fruit juice ( cup [237 mL] daily).  Eat Less Often: Dried fruits with added sugar. Canned fruit in light or heavy syrup. Lean Meats, Fish, and Poultry (2 servings or less daily. One serving is 3 to 4 oz [85-114 g]).  Eat More Often: Ninety percent or leaner ground beef, tenderloin, sirloin. Round cuts of beef, chicken breast, turkey breast. All fish. Grill, bake, or broil your meat. Nothing should be fried.  Eat Less Often/Avoid: Fatty cuts of meat, turkey, or chicken leg, thigh, or wing. Fried cuts of meat or fish. Dairy (2 to 3 servings)  Eat More Often: Low-fat or fat-free milk, low-fat plain or light yogurt,  reduced-fat or part-skim cheese.  Eat Less Often/Avoid: Milk (whole, 2%).Whole milk yogurt. Full-fat cheeses. Nuts, Seeds, and Legumes (4 to 5 servings per week)  Eat More Often: All without added salt.  Eat Less Often/Avoid: Salted nuts and seeds, canned beans with added salt. Fats and Sweets (limited)  Eat More Often: Vegetable oils, tub margarines without trans fats, sugar-free gelatin. Mayonnaise and salad dressings.  Eat Less Often/Avoid: Coconut oils, palm oils, butter, stick margarine, cream, half and half, cookies, candy, pie. FOR MORE INFORMATION The Dash Diet Eating Plan: www.dashdiet.org Document Released: 10/01/2011 Document Revised: 01/04/2012 Document Reviewed: 10/01/2011 ExitCare Patient Information 2013 [employer].  

## 2013-02-27 ENCOUNTER — Other Ambulatory Visit: Payer: Self-pay | Admitting: Radiology

## 2013-02-27 MED ORDER — LISINOPRIL-HYDROCHLOROTHIAZIDE 10-12.5 MG PO TABS: 1.0000 | ORAL_TABLET | Freq: Every day | ORAL | Status: DC

## 2013-02-27 NOTE — Telephone Encounter (Signed)
Sent in her Lisinopril/ hctz

## 2013-10-06 ENCOUNTER — Ambulatory Visit (INDEPENDENT_AMBULATORY_CARE_PROVIDER_SITE_OTHER): Payer: Managed Care, Other (non HMO) | Admitting: Internal Medicine

## 2013-10-06 VITALS — BP 148/80 | HR 99 | Temp 98.6°F | Resp 18 | Ht 66.0 in | Wt 216.0 lb

## 2013-10-06 DIAGNOSIS — E039 Hypothyroidism, unspecified: Secondary | ICD-10-CM

## 2013-10-06 DIAGNOSIS — I10 Essential (primary) hypertension: Secondary | ICD-10-CM

## 2013-10-06 DIAGNOSIS — Z23 Encounter for immunization: Secondary | ICD-10-CM

## 2013-10-06 DIAGNOSIS — R209 Unspecified disturbances of skin sensation: Secondary | ICD-10-CM

## 2013-10-06 DIAGNOSIS — K219 Gastro-esophageal reflux disease without esophagitis: Secondary | ICD-10-CM

## 2013-10-06 DIAGNOSIS — E785 Hyperlipidemia, unspecified: Secondary | ICD-10-CM

## 2013-10-06 DIAGNOSIS — R202 Paresthesia of skin: Secondary | ICD-10-CM

## 2013-10-06 LAB — POCT CBC
Granulocyte percent: 55.2 %G (ref 37–80)
HCT, POC: 40.6 % (ref 37.7–47.9)
Hemoglobin: 12.3 g/dL (ref 12.2–16.2)
Lymph, poc: 2.9 (ref 0.6–3.4)
MCH, POC: 26.4 pg — AB (ref 27–31.2)
MCHC: 30.3 g/dL — AB (ref 31.8–35.4)
MCV: 87.1 fL (ref 80–97)
MID (cbc): 0.6 (ref 0–0.9)
MPV: 10.3 fL (ref 0–99.8)
POC Granulocyte: 4.3 (ref 2–6.9)
POC LYMPH PERCENT: 36.7 %L (ref 10–50)
POC MID %: 8.1 %M (ref 0–12)
Platelet Count, POC: 280 10*3/uL (ref 142–424)
RBC: 4.66 M/uL (ref 4.04–5.48)
RDW, POC: 15.5 %
WBC: 7.8 10*3/uL (ref 4.6–10.2)

## 2013-10-06 MED ORDER — LISINOPRIL-HYDROCHLOROTHIAZIDE 10-12.5 MG PO TABS: 1.0000 | ORAL_TABLET | Freq: Every day | ORAL | Status: DC

## 2013-10-06 NOTE — Progress Notes (Addendum)
Subjective:  This chart was scribed for [facility], by Ladona Ridgel Day, Scribe. This patient was seen in room 14 and the patient's care was started at 6:49 PM.   Patient ID: Erika Cervantes, female    DOB: 04/18/1956, 57 y.o.   MRN: [id number]  HPI Erika Cervantes is a 57 y.o. female who presents to the [facility] complaining of constant lower lip numbness, onset when she woke up yesterday morning, no traumatic injury but states that she has not been taking her blood pressure medicine since May ( 7 months ago). She reports that her lower lip feels completely numb like when she has novo caine at the dentist. She denies any bumps or skin irritation overlying her lower lip. She also reports hot flashes lately. No trouble swallowing. No trouble with speaking. No vision changes or headache.  History reviewed. No pertinent past medical history. Past Surgical History  Procedure Laterality Date  . Colon surgery    . Tonsillectomy    . Abdominal hysterectomy      Fibroids  . Breast surgery      Right breast mass  . Hammer toes     Family History  Problem Relation Age of Onset  . Heart disease Mother    History   Social History  . Marital Status: Married    Spouse Name: N/A    Number of Children: N/A  . Years of Education: N/A   Occupational History  . Not on file.   Social History Main Topics  . Smoking status: Former Games developer  . Smokeless tobacco: Not on file  . Alcohol Use: No  . Drug Use: No  . Sexual Activity: Not on file   Other Topics Concern  . Not on file   Social History Narrative  . No narrative on file   Allergies  Allergen Reactions  . Darvocet [Propoxyphene-Acetaminophen]     vomit   Patient Active Problem List   Diagnosis Date Noted  . Vitamin d deficiency 05/26/2012  . Hypothyroidism 02/26/2012  . Hyperlipidemia with target LDL less than 100 02/26/2012  . Allergic rhinitis due to allergen 02/26/2012  . HTN (hypertension) 02/26/2012    . Obesity (BMI 30.0-34.9) 02/26/2012  . Situational anxiety 02/26/2012  . Bulging lumbar disc 02/26/2012  . GERD (gastroesophageal reflux disease) 02/26/2012   Current outpatient prescriptions Has run out  Review of Systems  Constitutional: Negative for fever and chills.  HENT: Negative for ear pain, rhinorrhea and sinus pressure.        Lower lip numbness  Eyes: Negative for visual disturbance.  Respiratory: Negative for cough and shortness of breath.   Cardiovascular: Negative for chest pain, palpitations and leg swelling.  Gastrointestinal: Negative for abdominal pain.  Skin: Negative for color change.  Neurological: Positive for numbness (numbness of her lower lip). Negative for speech difficulty and headaches.  Psychiatric/Behavioral: Negative for sleep disturbance.       Objective:   Physical Exam  Nursing note and vitals reviewed. Constitutional: She is oriented to person, place, and time. She appears well-developed and well-nourished. No distress.  HENT:  Head: Normocephalic and atraumatic.  Right Ear: External ear normal.  Left Ear: External ear normal.  Nose: Nose normal.  Mouth/Throat: Oropharynx is clear and moist.  Poor dentition-but no dental abscesses The lips are not swollen and there are no skin lesions on lips. She has decreased sensation to touch on the lateral portion of the lower lip on the left Tongue  protrudes in a straight line Cranial nerves are intact No regional adenopathy  Eyes: Conjunctivae and EOM are normal. Pupils are equal, round, and reactive to light. Right eye exhibits no discharge. Left eye exhibits no discharge.  Neck: Normal range of motion. No thyromegaly present.  Cardiovascular: Normal rate, regular rhythm and normal heart sounds.   No murmur heard. Pulmonary/Chest: Effort normal and breath sounds normal.  Musculoskeletal: Normal range of motion. She exhibits no edema.  Lymphadenopathy:    She has no cervical adenopathy.   Neurological: She is alert and oriented to person, place, and time. No cranial nerve deficit.  Cranial nerves 2-12 intact.   Skin: Skin is warm and dry.  Psychiatric: She has a normal mood and affect. Her behavior is normal. Thought content normal.   Triage Vitals: BP 148/80  Pulse 99  Temp(Src) 98.6 F (37 C) (Oral)  Resp 18  Ht 5\' 6"  (1.676 m)  Wt 216 lb (97.977 kg)  BMI 34.88 kg/m2  SpO2 96%     Assessment & Plan:  I have completed the patient encounter in its entirety as documented by the scribe, with editing by me where necessary. Mirtha Jain P. Merla Riches, M.D. Need for prophylactic vaccination and inoculation against influenza - Plan: Flu Vaccine QUAD 36+ mos IM  Paresthesia of lower lip -unclear etiology  HTN (hypertension) -loss of compliance/restart medication  Hypothyroidism - Plan: Check T4, free, TSH  Hyperlipidemia with target LDL less than 100 - check Lipid panel  GERD (gastroesophageal reflux disease) -omeprazole  Meds ordered this encounter  Medications  . lisinopril-hydrochlorothiazide (PRINZIDE,ZESTORETIC) 10-12.5 MG per tablet    Sig: Take 1 tablet by mouth daily.    Dispense:  90 tablet    Refill:  1   Notify routine labs/establish followup/? Need her thyroid medication/? Need for statins   Addendum-October 08 2013 TSH is elevated and T4 is low Levothyrox will be restarted 50 mcg and labs checked in 3 months

## 2013-10-07 LAB — COMPREHENSIVE METABOLIC PANEL
ALT: 11 U/L (ref 0–35)
AST: 18 U/L (ref 0–37)
Albumin: 4.2 g/dL (ref 3.5–5.2)
Alkaline Phosphatase: 49 U/L (ref 39–117)
BUN: 14 mg/dL (ref 6–23)
CO2: 25 mEq/L (ref 19–32)
Calcium: 9.7 mg/dL (ref 8.4–10.5)
Chloride: 108 mEq/L (ref 96–112)
Creat: 1.02 mg/dL (ref 0.50–1.10)
Glucose, Bld: 99 mg/dL (ref 70–99)
Potassium: 4.1 mEq/L (ref 3.5–5.3)
Sodium: 140 mEq/L (ref 135–145)
Total Bilirubin: 0.4 mg/dL (ref 0.3–1.2)
Total Protein: 7.7 g/dL (ref 6.0–8.3)

## 2013-10-07 LAB — LIPID PANEL
Cholesterol: 193 mg/dL (ref 0–200)
HDL: 41 mg/dL (ref >39)
LDL Cholesterol: 107 mg/dL — ABNORMAL HIGH (ref 0–99)
Total CHOL/HDL Ratio: 4.7 Ratio
Triglycerides: 224 mg/dL — ABNORMAL HIGH (ref <150)
VLDL: 45 mg/dL — ABNORMAL HIGH (ref 0–40)

## 2013-10-07 LAB — T4, FREE: Free T4: 0.61 ng/dL — ABNORMAL LOW (ref 0.80–1.80)

## 2013-10-07 LAB — TSH: TSH: 18.346 u[IU]/mL — ABNORMAL HIGH (ref 0.350–4.500)

## 2013-10-08 ENCOUNTER — Encounter: Payer: Self-pay | Admitting: Internal Medicine

## 2013-10-08 MED ORDER — LEVOTHYROXINE SODIUM 50 MCG PO TABS: 50.0000 ug | ORAL_TABLET | Freq: Every day | ORAL | Status: DC

## 2013-10-08 NOTE — Addendum Note (Signed)
Addended by: Tonye Pearson on: 10/08/2013 11:36 PM   Modules accepted: Orders

## 2013-10-10 NOTE — Progress Notes (Signed)
No Answer when I called home and cell phone number.

## 2014-01-11 ENCOUNTER — Ambulatory Visit (INDEPENDENT_AMBULATORY_CARE_PROVIDER_SITE_OTHER): Payer: Managed Care, Other (non HMO) | Admitting: Family Medicine

## 2014-01-11 VITALS — BP 146/68 | HR 88 | Temp 98.8°F | Resp 18 | Ht 66.75 in | Wt 231.2 lb

## 2014-01-11 DIAGNOSIS — J040 Acute laryngitis: Secondary | ICD-10-CM

## 2014-01-11 DIAGNOSIS — J029 Acute pharyngitis, unspecified: Secondary | ICD-10-CM

## 2014-01-11 DIAGNOSIS — J0100 Acute maxillary sinusitis, unspecified: Secondary | ICD-10-CM

## 2014-01-11 MED ORDER — AMOXICILLIN-POT CLAVULANATE 875-125 MG PO TABS: 1.0000 | ORAL_TABLET | Freq: Two times a day (BID) | ORAL | Status: DC

## 2014-01-11 NOTE — Progress Notes (Signed)
Subjective:    Patient ID: Erika Cervantes, female    DOB: 1956-04-23, 58 y.o.   MRN: [id number] This chart was scribed for Erika Ray, MD by Rolanda Lundborg, ED Scribe. This patient was seen in room 14 and the patient's care was started at 8:10 PM.  Chief Complaint  Patient presents with  . Sinus Problem    last week  . Sore Throat    HPI HPI Comments: Erika Cervantes is a 58 y.o. female who presents to the [facility] complaining of gradually-worsening sore throat, congestion, cough, and sinus pressure onset 9 days ago. She reports her ears have been popping and hurting, worse in the left. She also reports hoarse voice. She is also having some facial pain localized in bilateral cheeks. She states the cough is sometimes productive and phlegm was yellow/green last week but this week is clear. Rhinorrhea is also clear. She denies fevers.   PCP - No PCP Per Patient  Patient Active Problem List   Diagnosis Date Noted  . Vitamin d deficiency 05/26/2012  . Hypothyroidism 02/26/2012  . Hyperlipidemia with target LDL less than 100 02/26/2012  . Allergic rhinitis due to allergen 02/26/2012  . HTN (hypertension) 02/26/2012  . Obesity (BMI 30.0-34.9) 02/26/2012  . Situational anxiety 02/26/2012  . Bulging lumbar disc 02/26/2012  . GERD (gastroesophageal reflux disease) 02/26/2012   No past medical history on file. Past Surgical History  Procedure Laterality Date  . Colon surgery    . Tonsillectomy    . Abdominal hysterectomy      Fibroids  . Breast surgery      Right breast mass  . Hammer toes     Allergies  Allergen Reactions  . Darvocet [Propoxyphene N-Acetaminophen]     vomit   Prior to Admission medications   Medication Sig Start Date End Date Taking? Authorizing Provider  levothyroxine (SYNTHROID, LEVOTHROID) 50 MCG tablet Take 1 tablet (50 mcg total) by mouth daily. 10/08/13   Leandrew Koyanagi, MD  lisinopril-hydrochlorothiazide (PRINZIDE,ZESTORETIC)  10-12.5 MG per tablet Take 1 tablet by mouth daily. 10/06/13   Leandrew Koyanagi, MD  omeprazole (PRILOSEC) 20 MG capsule Take 1 capsule (20 mg total) by mouth daily. 02/25/13   Thao P Le, DO  Vitamin D, Ergocalciferol, (DRISDOL) 50000 UNITS CAPS TAKE 1 CAPSULE (50,000 UNITS TOTAL) BY MOUTH ONCE A WEEK. 07/13/12   Collene Leyden, PA-C   History  Substance Use Topics  . Smoking status: Former Research scientist (life sciences)  . Smokeless tobacco: Not on file  . Alcohol Use: No      Review of Systems  Constitutional: Negative for fever.  HENT: Positive for congestion, ear pain, rhinorrhea, sinus pressure, sore throat and voice change.   Respiratory: Positive for cough.        Objective:   Physical Exam  Vitals reviewed. Constitutional: She is oriented to person, place, and time. She appears well-developed and well-nourished. No distress.  HENT:  Head: Normocephalic and atraumatic.  Right Ear: Hearing, tympanic membrane, external ear and ear canal normal.  Left Ear: Hearing, tympanic membrane, external ear and ear canal normal.  Nose: Right sinus exhibits maxillary sinus tenderness. Right sinus exhibits no frontal sinus tenderness. Left sinus exhibits maxillary sinus tenderness. Left sinus exhibits no frontal sinus tenderness.  Mouth/Throat: Oropharynx is clear and moist. No oropharyngeal exudate.  No hypertrophy of the tonsils.   Eyes: Conjunctivae and EOM are normal. Pupils are equal, round, and reactive to light.  Cardiovascular: Normal rate, regular  rhythm, normal heart sounds and intact distal pulses.   No murmur heard. Pulmonary/Chest: Effort normal and breath sounds normal. No respiratory distress. She has no wheezes. She has no rhonchi.  Lymphadenopathy:    She has no cervical adenopathy.  Neurological: She is alert and oriented to person, place, and time.  Skin: Skin is warm and dry. No rash noted.  Psychiatric: She has a normal mood and affect. Her behavior is normal.     Filed Vitals:    01/11/14 2003  BP: 146/68  Pulse: 88  Temp: 98.8 F (37.1 C)  TempSrc: Oral  Resp: 18  Height: 5' 6.75" (1.695 m)  Weight: 231 lb 3.2 oz (104.872 kg)  SpO2: 97%        Assessment & Plan:   Erika Cervantes is a 58 y.o. female Laryngitis  Sinusitis, acute, maxillary - Plan: amoxicillin-clavulanate (AUGMENTIN) 875-125 MG per tablet  Sore throat  Suspected viral illness initially with secondary sickening.  Reassuring exam, but suspect sinusitis. Rx augmentin, sx. Care as below and rtc precautions discussed.   Meds ordered this encounter  Medications  . amoxicillin-clavulanate (AUGMENTIN) 875-125 MG per tablet    Sig: Take 1 tablet by mouth 2 (two) times daily.    Dispense:  20 tablet    Refill:  0   Patient Instructions  Saline nasal spray atleast 4 times per day, over the counter mucinex or mucinex DM if needed for cough, sore throat lozneges as needed, drink plenty of fluids, start antibiotic as discussed. Return to the clinic or go to the nearest emergency room if any of your symptoms worsen or new symptoms occur.  Sinusitis Sinusitis is redness, soreness, and swelling (inflammation) of the paranasal sinuses. Paranasal sinuses are air pockets within the bones of your face (beneath the eyes, the middle of the forehead, or above the eyes). In healthy paranasal sinuses, mucus is able to drain out, and air is able to circulate through them by way of your nose. However, when your paranasal sinuses are inflamed, mucus and air can become trapped. This can allow bacteria and other germs to grow and cause infection. Sinusitis can develop quickly and last only a short time (acute) or continue over a long period (chronic). Sinusitis that lasts for more than 12 weeks is considered chronic.  CAUSES  Causes of sinusitis include:  Allergies.  Structural abnormalities, such as displacement of the cartilage that separates your nostrils (deviated septum), which can decrease the air flow through  your nose and sinuses and affect sinus drainage.  Functional abnormalities, such as when the small hairs (cilia) that line your sinuses and help remove mucus do not work properly or are not present. SYMPTOMS  Symptoms of acute and chronic sinusitis are the same. The primary symptoms are pain and pressure around the affected sinuses. Other symptoms include:  Upper toothache.  Earache.  Headache.  Bad breath.  Decreased sense of smell and taste.  A cough, which worsens when you are lying flat.  Fatigue.  Fever.  Thick drainage from your nose, which often is green and may contain pus (purulent).  Swelling and warmth over the affected sinuses. DIAGNOSIS  Your caregiver will perform a physical exam. During the exam, your caregiver may:  Look in your nose for signs of abnormal growths in your nostrils (nasal polyps).  Tap over the affected sinus to check for signs of infection.  View the inside of your sinuses (endoscopy) with a special imaging device with a light attached (endoscope), which  is inserted into your sinuses. If your caregiver suspects that you have chronic sinusitis, one or more of the following tests may be recommended:  Allergy tests.  Nasal culture A sample of mucus is taken from your nose and sent to a lab and screened for bacteria.  Nasal cytology A sample of mucus is taken from your nose and examined by your caregiver to determine if your sinusitis is related to an allergy. TREATMENT  Most cases of acute sinusitis are related to a viral infection and will resolve on their own within 10 days. Sometimes medicines are prescribed to help relieve symptoms (pain medicine, decongestants, nasal steroid sprays, or saline sprays).  However, for sinusitis related to a bacterial infection, your caregiver will prescribe antibiotic medicines. These are medicines that will help kill the bacteria causing the infection.  Rarely, sinusitis is caused by a fungal infection. In  theses cases, your caregiver will prescribe antifungal medicine. For some cases of chronic sinusitis, surgery is needed. Generally, these are cases in which sinusitis recurs more than 3 times per year, despite other treatments. HOME CARE INSTRUCTIONS   Drink plenty of water. Water helps thin the mucus so your sinuses can drain more easily.  Use a humidifier.  Inhale steam 3 to 4 times a day (for example, sit in the bathroom with the shower running).  Apply a warm, moist washcloth to your face 3 to 4 times a day, or as directed by your caregiver.  Use saline nasal sprays to help moisten and clean your sinuses.  Take over-the-counter or prescription medicines for pain, discomfort, or fever only as directed by your caregiver. SEEK IMMEDIATE MEDICAL CARE IF:  You have increasing pain or severe headaches.  You have nausea, vomiting, or drowsiness.  You have swelling around your face.  You have vision problems.  You have a stiff neck.  You have difficulty breathing. MAKE SURE YOU:   Understand these instructions.  Will watch your condition.  Will get help right away if you are not doing well or get worse. Document Released: 10/12/2005 Document Revised: 01/04/2012 Document Reviewed: 10/27/2011 [facility] Patient Information 2014 [place], [place].    Sore Throat A sore throat is pain, burning, irritation, or scratchiness of the throat. There is often pain or tenderness when swallowing or talking. A sore throat may be accompanied by other symptoms, such as coughing, sneezing, fever, and swollen neck glands. A sore throat is often the first sign of another sickness, such as a cold, flu, strep throat, or mononucleosis (commonly known as mono). Most sore throats go away without medical treatment. CAUSES  The most common causes of a sore throat include:  A viral infection, such as a cold, flu, or mono.  A bacterial infection, such as strep throat, tonsillitis, or whooping  cough.  Seasonal allergies.  Dryness in the air.  Irritants, such as smoke or pollution.  Gastroesophageal reflux disease (GERD). HOME CARE INSTRUCTIONS   Only take over-the-counter medicines as directed by your caregiver.  Drink enough fluids to keep your urine clear or pale yellow.  Rest as needed.  Try using throat sprays, lozenges, or sucking on hard candy to ease any pain (if older than 4 years or as directed).  Sip warm liquids, such as broth, herbal tea, or warm water with honey to relieve pain temporarily. You may also eat or drink cold or frozen liquids such as frozen ice pops.  Gargle with salt water (mix 1 tsp salt with 8 oz of water).  Do  not smoke and avoid secondhand smoke.  Put a cool-mist humidifier in your bedroom at night to moisten the air. You can also turn on a hot shower and sit in the bathroom with the door closed for 5 10 minutes. SEEK IMMEDIATE MEDICAL CARE IF:  You have difficulty breathing.  You are unable to swallow fluids, soft foods, or your saliva.  You have increased swelling in the throat.  Your sore throat does not get better in 7 days.  You have nausea and vomiting.  You have a fever or persistent symptoms for more than 2 3 days.  You have a fever and your symptoms suddenly get worse. MAKE SURE YOU:   Understand these instructions.  Will watch your condition.  Will get help right away if you are not doing well or get worse. Document Released: 11/19/2004 Document Revised: 09/28/2012 Document Reviewed: 06/19/2012 [facility] Patient Information 2014 [place], [place].      I personally performed the services described in this documentation, which was scribed in my presence. The recorded information has been reviewed and considered, and addended by me as needed.

## 2014-01-11 NOTE — Patient Instructions (Signed)
Saline nasal spray atleast 4 times per day, over the counter mucinex or mucinex DM if needed for cough, sore throat lozneges as needed, drink plenty of fluids, start antibiotic as discussed. Return to the clinic or go to the nearest emergency room if any of your symptoms worsen or new symptoms occur.  Sinusitis Sinusitis is redness, soreness, and swelling (inflammation) of the paranasal sinuses. Paranasal sinuses are air pockets within the bones of your face (beneath the eyes, the middle of the forehead, or above the eyes). In healthy paranasal sinuses, mucus is able to drain out, and air is able to circulate through them by way of your nose. However, when your paranasal sinuses are inflamed, mucus and air can become trapped. This can allow bacteria and other germs to grow and cause infection. Sinusitis can develop quickly and last only a short time (acute) or continue over a long period (chronic). Sinusitis that lasts for more than 12 weeks is considered chronic.  CAUSES  Causes of sinusitis include:  Allergies.  Structural abnormalities, such as displacement of the cartilage that separates your nostrils (deviated septum), which can decrease the air flow through your nose and sinuses and affect sinus drainage.  Functional abnormalities, such as when the small hairs (cilia) that line your sinuses and help remove mucus do not work properly or are not present. SYMPTOMS  Symptoms of acute and chronic sinusitis are the same. The primary symptoms are pain and pressure around the affected sinuses. Other symptoms include:  Upper toothache.  Earache.  Headache.  Bad breath.  Decreased sense of smell and taste.  A cough, which worsens when you are lying flat.  Fatigue.  Fever.  Thick drainage from your nose, which often is green and may contain pus (purulent).  Swelling and warmth over the affected sinuses. DIAGNOSIS  Your caregiver will perform a physical exam. During the exam, your  caregiver may:  Look in your nose for signs of abnormal growths in your nostrils (nasal polyps).  Tap over the affected sinus to check for signs of infection.  View the inside of your sinuses (endoscopy) with a special imaging device with a light attached (endoscope), which is inserted into your sinuses. If your caregiver suspects that you have chronic sinusitis, one or more of the following tests may be recommended:  Allergy tests.  Nasal culture A sample of mucus is taken from your nose and sent to a lab and screened for bacteria.  Nasal cytology A sample of mucus is taken from your nose and examined by your caregiver to determine if your sinusitis is related to an allergy. TREATMENT  Most cases of acute sinusitis are related to a viral infection and will resolve on their own within 10 days. Sometimes medicines are prescribed to help relieve symptoms (pain medicine, decongestants, nasal steroid sprays, or saline sprays).  However, for sinusitis related to a bacterial infection, your caregiver will prescribe antibiotic medicines. These are medicines that will help kill the bacteria causing the infection.  Rarely, sinusitis is caused by a fungal infection. In theses cases, your caregiver will prescribe antifungal medicine. For some cases of chronic sinusitis, surgery is needed. Generally, these are cases in which sinusitis recurs more than 3 times per year, despite other treatments. HOME CARE INSTRUCTIONS   Drink plenty of water. Water helps thin the mucus so your sinuses can drain more easily.  Use a humidifier.  Inhale steam 3 to 4 times a day (for example, sit in the bathroom with the shower  running).  Apply a warm, moist washcloth to your face 3 to 4 times a day, or as directed by your caregiver.  Use saline nasal sprays to help moisten and clean your sinuses.  Take over-the-counter or prescription medicines for pain, discomfort, or fever only as directed by your caregiver. SEEK  IMMEDIATE MEDICAL CARE IF:  You have increasing pain or severe headaches.  You have nausea, vomiting, or drowsiness.  You have swelling around your face.  You have vision problems.  You have a stiff neck.  You have difficulty breathing. MAKE SURE YOU:   Understand these instructions.  Will watch your condition.  Will get help right away if you are not doing well or get worse. Document Released: 10/12/2005 Document Revised: 01/04/2012 Document Reviewed: 10/27/2011 [facility] Patient Information 2014 [place], [place].    Sore Throat A sore throat is pain, burning, irritation, or scratchiness of the throat. There is often pain or tenderness when swallowing or talking. A sore throat may be accompanied by other symptoms, such as coughing, sneezing, fever, and swollen neck glands. A sore throat is often the first sign of another sickness, such as a cold, flu, strep throat, or mononucleosis (commonly known as mono). Most sore throats go away without medical treatment. CAUSES  The most common causes of a sore throat include:  A viral infection, such as a cold, flu, or mono.  A bacterial infection, such as strep throat, tonsillitis, or whooping cough.  Seasonal allergies.  Dryness in the air.  Irritants, such as smoke or pollution.  Gastroesophageal reflux disease (GERD). HOME CARE INSTRUCTIONS   Only take over-the-counter medicines as directed by your caregiver.  Drink enough fluids to keep your urine clear or pale yellow.  Rest as needed.  Try using throat sprays, lozenges, or sucking on hard candy to ease any pain (if older than 4 years or as directed).  Sip warm liquids, such as broth, herbal tea, or warm water with honey to relieve pain temporarily. You may also eat or drink cold or frozen liquids such as frozen ice pops.  Gargle with salt water (mix 1 tsp salt with 8 oz of water).  Do not smoke and avoid secondhand smoke.  Put a cool-mist humidifier in your bedroom  at night to moisten the air. You can also turn on a hot shower and sit in the bathroom with the door closed for 5 10 minutes. SEEK IMMEDIATE MEDICAL CARE IF:  You have difficulty breathing.  You are unable to swallow fluids, soft foods, or your saliva.  You have increased swelling in the throat.  Your sore throat does not get better in 7 days.  You have nausea and vomiting.  You have a fever or persistent symptoms for more than 2 3 days.  You have a fever and your symptoms suddenly get worse. MAKE SURE YOU:   Understand these instructions.  Will watch your condition.  Will get help right away if you are not doing well or get worse. Document Released: 11/19/2004 Document Revised: 09/28/2012 Document Reviewed: 06/19/2012 [facility] Patient Information 2014 [place], [place].

## 2014-03-08 ENCOUNTER — Other Ambulatory Visit: Payer: Self-pay | Admitting: Family Medicine

## 2014-03-12 ENCOUNTER — Ambulatory Visit (INDEPENDENT_AMBULATORY_CARE_PROVIDER_SITE_OTHER): Payer: Managed Care, Other (non HMO) | Admitting: Family Medicine

## 2014-03-12 VITALS — BP 100/60 | HR 83 | Temp 98.2°F | Resp 16 | Ht 65.0 in | Wt 206.0 lb

## 2014-03-12 DIAGNOSIS — H9209 Otalgia, unspecified ear: Secondary | ICD-10-CM

## 2014-03-12 DIAGNOSIS — E039 Hypothyroidism, unspecified: Secondary | ICD-10-CM

## 2014-03-12 DIAGNOSIS — J019 Acute sinusitis, unspecified: Secondary | ICD-10-CM

## 2014-03-12 DIAGNOSIS — E785 Hyperlipidemia, unspecified: Secondary | ICD-10-CM

## 2014-03-12 DIAGNOSIS — I10 Essential (primary) hypertension: Secondary | ICD-10-CM

## 2014-03-12 MED ORDER — AMOXICILLIN-POT CLAVULANATE 875-125 MG PO TABS: 1.0000 | ORAL_TABLET | Freq: Two times a day (BID) | ORAL | Status: DC

## 2014-03-12 NOTE — Patient Instructions (Signed)

## 2014-03-12 NOTE — Progress Notes (Signed)
Chief Complaint:  Chief Complaint  Patient presents with  . Otalgia    left ear started last night    HPI: Erika Cervantes is a 58 y.o. female who is here for  Left ear pain with HA no fevers or chills, x 1 day.  Has tried nothing for it so far. + sinus pressure. Denies  asthma, allerges.   HTN and thyroid meds  Taking meds, no SE She was supposed to follow up for TSH that was elevated  but never did 3 months ago.  LAst TSH was 18  History reviewed. No pertinent past medical history. Past Surgical History  Procedure Laterality Date  . Colon surgery    . Tonsillectomy    . Abdominal hysterectomy      Fibroids  . Breast surgery      Right breast mass  . Hammer toes     History   Social History  . Marital Status: Married    Spouse Name: N/A    Number of Children: N/A  . Years of Education: N/A   Social History Main Topics  . Smoking status: Former Research scientist (life sciences)  . Smokeless tobacco: None  . Alcohol Use: No  . Drug Use: No  . Sexual Activity: None   Other Topics Concern  . None   Social History Narrative  . None   Family History  Problem Relation Age of Onset  . Heart disease Mother    Allergies  Allergen Reactions  . Darvocet [Propoxyphene N-Acetaminophen]     vomit   Prior to Admission medications   Medication Sig Start Date End Date Taking? Authorizing Provider  lisinopril-hydrochlorothiazide (PRINZIDE,ZESTORETIC) 10-12.5 MG per tablet Take 1 tablet by mouth daily. 10/06/13  Yes Leandrew Koyanagi, MD  omeprazole (PRILOSEC) 20 MG capsule TAKE 1 CAPSULE (20 MG TOTAL) BY MOUTH DAILY. 03/08/14  Yes Leandrew Koyanagi, MD  levothyroxine (SYNTHROID, LEVOTHROID) 50 MCG tablet Take 1 tablet (50 mcg total) by mouth daily. 10/08/13   Leandrew Koyanagi, MD     ROS: The patient denies fevers, chills, night sweats, unintentional weight loss, chest pain, palpitations, wheezing, dyspnea on exertion, nausea, vomiting, abdominal pain, dysuria, hematuria, melena,  numbness, weakness, or tingling.   All other systems have been reviewed and were otherwise negative with the exception of those mentioned in the HPI and as above.    PHYSICAL EXAM: Filed Vitals:   03/12/14 1303  BP: 100/60  Pulse: 83  Temp: 98.2 F (36.8 C)  Resp: 16   Filed Vitals:   03/12/14 1303  Height: 5\' 5"  (1.651 m)  Weight: 206 lb (93.441 kg)   Body mass index is 34.28 kg/(m^2).  General: Alert, no acute distress HEENT:  Normocephalic, atraumatic, oropharynx patent. EOMI, PERRLA, no exudates, +  Right TM slightly erytheamtous.  Cardiovascular:  Regular rate and rhythm, no rubs murmurs or gallops.  No Carotid bruits, radial pulse intact. No pedal edema.  Respiratory: Clear to auscultation bilaterally.  No wheezes, rales, or rhonchi.  No cyanosis, no use of accessory musculature GI: No organomegaly, abdomen is soft and non-tender, positive bowel sounds.  No masses. Skin: No rashes. Neurologic: Facial musculature symmetric. Psychiatric: Patient is appropriate throughout our interaction. Lymphatic: No cervical lymphadenopathy Musculoskeletal: Gait intact.   LABS: Results for orders placed in visit on 10/06/13  COMPREHENSIVE METABOLIC PANEL      Result Value Ref Range   Sodium 140  135 - 145 mEq/L   Potassium 4.1  3.5 - 5.3  mEq/L   Chloride 108  96 - 112 mEq/L   CO2 25  19 - 32 mEq/L   Glucose, Bld 99  70 - 99 mg/dL   BUN 14  6 - 23 mg/dL   Creat 1.02  0.50 - 1.10 mg/dL   Total Bilirubin 0.4  0.3 - 1.2 mg/dL   Alkaline Phosphatase 49  39 - 117 U/L   AST 18  0 - 37 U/L   ALT 11  0 - 35 U/L   Total Protein 7.7  6.0 - 8.3 g/dL   Albumin 4.2  3.5 - 5.2 g/dL   Calcium 9.7  8.4 - 10.5 mg/dL  LIPID PANEL      Result Value Ref Range   Cholesterol 193  0 - 200 mg/dL   Triglycerides 224 (*) <150 mg/dL   HDL 41  >39 mg/dL   Total CHOL/HDL Ratio 4.7     VLDL 45 (*) 0 - 40 mg/dL   LDL Cholesterol 107 (*) 0 - 99 mg/dL  T4, FREE      Result Value Ref Range   Free T4  0.61 (*) 0.80 - 1.80 ng/dL  TSH      Result Value Ref Range   TSH 18.346 (*) 0.350 - 4.500 uIU/mL  POCT CBC      Result Value Ref Range   WBC 7.8  4.6 - 10.2 K/uL   Lymph, poc 2.9  0.6 - 3.4   POC LYMPH PERCENT 36.7  10 - 50 %L   MID (cbc) 0.6  0 - 0.9   POC MID % 8.1  0 - 12 %M   POC Granulocyte 4.3  2 - 6.9   Granulocyte percent 55.2  37 - 80 %G   RBC 4.66  4.04 - 5.48 M/uL   Hemoglobin 12.3  12.2 - 16.2 g/dL   HCT, POC 40.6  37.7 - 47.9 %   MCV 87.1  80 - 97 fL   MCH, POC 26.4 (*) 27 - 31.2 pg   MCHC 30.3 (*) 31.8 - 35.4 g/dL   RDW, POC 15.5     Platelet Count, POC 280  142 - 424 K/uL   MPV 10.3  0 - 99.8 fL     EKG/XRAY:   Primary read interpreted by Dr. Marin Comment at [facility].   ASSESSMENT/PLAN: Encounter Diagnoses  Name Primary?  . HTN (hypertension) Yes  . Hypothyroid   . Acute sinusitis   . Otalgia   . Other and unspecified hyperlipidemia    She has enough medicines for 1 week She will get refill if TSH is normal for her meds, she needs 90 day refills for all IF all labs look ok then F/u in 6 months, otherwise will follow-up as directed Rx augmentin for early OM and sinusitis  Gross sideeffects, risk and benefits, and alternatives of medications d/w patient. Patient is aware that all medications have potential sideeffects and we are unable to predict every sideeffect or drug-drug interaction that may occur.  Glenford Bayley, DO 03/12/2014 1:54 PM

## 2014-03-13 LAB — COMPLETE METABOLIC PANEL WITH GFR
ALT: 13 U/L (ref 0–35)
AST: 20 U/L (ref 0–37)
Albumin: 4.4 g/dL (ref 3.5–5.2)
Alkaline Phosphatase: 50 U/L (ref 39–117)
BUN: 17 mg/dL (ref 6–23)
CO2: 27 mEq/L (ref 19–32)
Calcium: 10.4 mg/dL (ref 8.4–10.5)
Chloride: 102 mEq/L (ref 96–112)
Creat: 1.11 mg/dL — ABNORMAL HIGH (ref 0.50–1.10)
GFR, Est African American: 64 mL/min
GFR, Est Non African American: 55 mL/min — ABNORMAL LOW
Glucose, Bld: 92 mg/dL (ref 70–99)
Potassium: 4.4 mEq/L (ref 3.5–5.3)
Sodium: 138 mEq/L (ref 135–145)
Total Bilirubin: 0.6 mg/dL (ref 0.2–1.2)
Total Protein: 8.4 g/dL — ABNORMAL HIGH (ref 6.0–8.3)

## 2014-03-13 LAB — MICROALBUMIN, URINE: Microalb, Ur: 0.5 mg/dL (ref 0.00–1.89)

## 2014-03-13 LAB — TSH: TSH: 13.877 u[IU]/mL — ABNORMAL HIGH (ref 0.350–4.500)

## 2014-03-13 LAB — LDL CHOLESTEROL, DIRECT: Direct LDL: 181 mg/dL — ABNORMAL HIGH

## 2014-03-13 LAB — T3: T3, Total: 70.4 ng/dL — ABNORMAL LOW (ref 80.0–204.0)

## 2014-03-13 LAB — T4, FREE: Free T4: 0.89 ng/dL (ref 0.80–1.80)

## 2014-03-16 ENCOUNTER — Telehealth: Payer: Self-pay | Admitting: Family Medicine

## 2014-03-16 ENCOUNTER — Other Ambulatory Visit: Payer: Self-pay | Admitting: Family Medicine

## 2014-03-16 MED ORDER — LEVOTHYROXINE SODIUM 75 MCG PO TABS: 75.0000 ug | ORAL_TABLET | Freq: Every day | ORAL | Status: DC

## 2014-03-16 MED ORDER — LISINOPRIL-HYDROCHLOROTHIAZIDE 10-12.5 MG PO TABS: 1.0000 | ORAL_TABLET | Freq: Every day | ORAL | Status: DC

## 2014-03-16 NOTE — Telephone Encounter (Signed)
LM that will change thyroid medication from 50 mcg to 75 mcg and she will take it on empty stomach with water and wait 30-60 min before eating or drinking anything. Recheck TSH in 8 weeks. Other labs are stable.

## 2014-03-26 ENCOUNTER — Encounter: Payer: Self-pay | Admitting: Family Medicine

## 2014-05-13 ENCOUNTER — Other Ambulatory Visit: Payer: Self-pay | Admitting: Internal Medicine

## 2014-05-14 NOTE — Telephone Encounter (Signed)
Dr Marin Comment, you saw pt in May for med RFs, but not for this med. You have Rxd it for pt last year. Do you want to give RFs or RTC to discuss?

## 2014-06-12 ENCOUNTER — Other Ambulatory Visit: Payer: Self-pay | Admitting: Family Medicine

## 2014-07-08 ENCOUNTER — Ambulatory Visit (INDEPENDENT_AMBULATORY_CARE_PROVIDER_SITE_OTHER): Payer: Managed Care, Other (non HMO) | Admitting: Emergency Medicine

## 2014-07-08 VITALS — BP 126/68 | HR 81 | Temp 97.9°F | Resp 16 | Ht 65.0 in | Wt 210.0 lb

## 2014-07-08 DIAGNOSIS — M654 Radial styloid tenosynovitis [de Quervain]: Secondary | ICD-10-CM

## 2014-07-08 DIAGNOSIS — I10 Essential (primary) hypertension: Secondary | ICD-10-CM

## 2014-07-08 DIAGNOSIS — E039 Hypothyroidism, unspecified: Secondary | ICD-10-CM

## 2014-07-08 LAB — LIPID PANEL
Cholesterol: 220 mg/dL — ABNORMAL HIGH (ref 0–200)
HDL: 36 mg/dL — ABNORMAL LOW (ref >39)
LDL Cholesterol: 143 mg/dL — ABNORMAL HIGH (ref 0–99)
Total CHOL/HDL Ratio: 6.1 Ratio
Triglycerides: 203 mg/dL — ABNORMAL HIGH (ref <150)
VLDL: 41 mg/dL — ABNORMAL HIGH (ref 0–40)

## 2014-07-08 LAB — COMPREHENSIVE METABOLIC PANEL
ALT: 10 U/L (ref 0–35)
AST: 16 U/L (ref 0–37)
Albumin: 4.2 g/dL (ref 3.5–5.2)
Alkaline Phosphatase: 42 U/L (ref 39–117)
BUN: 19 mg/dL (ref 6–23)
CO2: 26 mEq/L (ref 19–32)
Calcium: 9.4 mg/dL (ref 8.4–10.5)
Chloride: 101 mEq/L (ref 96–112)
Creat: 1.05 mg/dL (ref 0.50–1.10)
Glucose, Bld: 96 mg/dL (ref 70–99)
Potassium: 4.1 mEq/L (ref 3.5–5.3)
Sodium: 136 mEq/L (ref 135–145)
Total Bilirubin: 0.6 mg/dL (ref 0.2–1.2)
Total Protein: 7.6 g/dL (ref 6.0–8.3)

## 2014-07-08 LAB — POCT CBC
Granulocyte percent: 60.6 %G (ref 37–80)
HCT, POC: 38.8 % (ref 37.7–47.9)
Hemoglobin: 12.6 g/dL (ref 12.2–16.2)
Lymph, poc: 2.9 (ref 0.6–3.4)
MCH, POC: 26.8 pg — AB (ref 27–31.2)
MCHC: 32.6 g/dL (ref 31.8–35.4)
MCV: 82.2 fL (ref 80–97)
MID (cbc): 0.5 (ref 0–0.9)
MPV: 8.5 fL (ref 0–99.8)
POC Granulocyte: 5.2 (ref 2–6.9)
POC LYMPH PERCENT: 34 %L (ref 10–50)
POC MID %: 5.4 %M (ref 0–12)
Platelet Count, POC: 278 10*3/uL (ref 142–424)
RBC: 4.72 M/uL (ref 4.04–5.48)
RDW, POC: 14.3 %
WBC: 8.6 10*3/uL (ref 4.6–10.2)

## 2014-07-08 LAB — TSH: TSH: 4.266 u[IU]/mL (ref 0.350–4.500)

## 2014-07-08 MED ORDER — CELECOXIB 200 MG PO CAPS: 200.0000 mg | ORAL_CAPSULE | Freq: Every day | ORAL | Status: DC

## 2014-07-08 NOTE — Progress Notes (Signed)
[facility] [address], [place] [address] [phone]  Date:  07/08/2014   Name:  Erika Cervantes   DOB:  16-Apr-1956   MRN:  [id number]  PCP:  No PCP Per Patient    Chief Complaint: Hand Pain, Follow-up and Leg Pain   History of Present Illness:  Erika Cervantes is a 58 y.o. very pleasant female patient who presents with the following:  Numerous complaints.   Has pain in right wrist when lifting.  No history of injury or overuse. Has frequent muscle cramps relieved by gatorade.  On HCTZ Requests a TSH check on new dose of synthroid.   Patient Active Problem List   Diagnosis Date Noted  . Vitamin d deficiency 05/26/2012  . Hypothyroidism 02/26/2012  . Hyperlipidemia with target LDL less than 100 02/26/2012  . Allergic rhinitis due to allergen 02/26/2012  . HTN (hypertension) 02/26/2012  . Obesity (BMI 30.0-34.9) 02/26/2012  . Situational anxiety 02/26/2012  . Bulging lumbar disc 02/26/2012  . GERD (gastroesophageal reflux disease) 02/26/2012    History reviewed. No pertinent past medical history.  Past Surgical History  Procedure Laterality Date  . Colon surgery    . Tonsillectomy    . Abdominal hysterectomy      Fibroids  . Breast surgery      Right breast mass  . Hammer toes      History  Substance Use Topics  . Smoking status: Former Research scientist (life sciences)  . Smokeless tobacco: Not on file  . Alcohol Use: No    Family History  Problem Relation Age of Onset  . Heart disease Mother     Allergies  Allergen Reactions  . Darvocet [Propoxyphene N-Acetaminophen]     vomit    Medication list has been reviewed and updated.  Current Outpatient Prescriptions on File Prior to Visit  Medication Sig Dispense Refill  . levothyroxine (SYNTHROID, LEVOTHROID) 75 MCG tablet Take 1 tablet (75 mcg total) by mouth daily before breakfast. PATIENT NEEDS OFFICE VISIT FOR ADDITIONAL REFILLS - overdue for re-check  30 tablet  0  . lisinopril-hydrochlorothiazide  (PRINZIDE,ZESTORETIC) 10-12.5 MG per tablet Take 1 tablet by mouth daily.  90 tablet  3  . omeprazole (PRILOSEC) 20 MG capsule Take 1 capsule (20 mg total) by mouth daily. PATIENT NEEDS OFFICE VISIT FOR ADDITIONAL REFILLS  30 capsule  0   No current facility-administered medications on file prior to visit.    Review of Systems:  As per HPI, otherwise negative.    Physical Examination: Filed Vitals:   07/08/14 1028  BP: 126/68  Pulse: 81  Temp: 97.9 F (36.6 C)  Resp: 16   Filed Vitals:   07/08/14 1028  Height: 5\' 5"  (1.651 m)  Weight: 210 lb (95.255 kg)   Body mass index is 34.95 kg/(m^2). Ideal Body Weight: Weight in (lb) to have BMI = 25: 149.9   GEN: WDWN, NAD, Non-toxic, Alert & Oriented x 3 HEENT: Atraumatic, Normocephalic.  Ears and Nose: No external deformity. EXTR: No clubbing/cyanosis/edema NEURO: Normal gait.  PSYCH: Normally interactive. Conversant. Not depressed or anxious appearing.  Calm demeanor.  RIGHT wrist:  Tender over radiolateral wrist.  Positive Finklestein test.  Assessment and Plan: Erika Cervantes Hypothyroid Possible hypokalemia Labs Splint celebrex  Signed,  Ellison Carwin, MD

## 2014-07-08 NOTE — Patient Instructions (Signed)
De Quervain's Tenosynovitis De Quervain's tenosynovitis involves inflammation of one or two tendon linings (sheaths) or strain of one or two tendons to the thumb: extensor pollicis brevis (EPB), or abductor pollicis longus (APL). This causes pain on the side of the wrist and base of the thumb. Tendon sheaths secrete a fluid that lubricates the tendon, allowing the tendon to move smoothly. When the sheath becomes inflamed, the tendon cannot move freely in the sheath. Both the EPB and APL tendons are important for proper use of the hand. The EPB tendon is important for straightening the thumb. The APL tendon is important for moving the thumb away from the index finger (abducting). The two tendons pass through a small tube (canal) in the wrist, near the base of the thumb. When the tendons become inflamed, pain is usually felt in this area. SYMPTOMS   Pain, tenderness, swelling, warmth, or redness over the base of the thumb and thumb side of the wrist.  Pain that gets worse when straightening the thumb.  Pain that gets worse when moving the thumb away from the index finger, against resistance.  Pain with pinching or gripping.  Locking or catching of the thumb.  Limited motion of the thumb.  Crackling sound (crepitation) when the tendon or thumb is moved or touched.  Fluid-filled cyst in the area of the base of the thumb. CAUSES   Tenosynovitis is often linked with overuse of the wrist.  Tenosynovitis may be caused by repeated injury to the thumb muscle and tendon units, and with repeated motions of the hand and wrist, due to friction of the tendon within the lining (sheath).  Tenosynovitis may also be due to a sudden increase in activity or change in activity. RISK INCREASES WITH:  Sports that involve repeated hand and wrist motions (golf, bowling, tennis, squash, racquetball).  Heavy labor.  Poor physical wrist strength and flexibility.  Failure to warm up properly before practice or  play.  Female gender.  New mothers who hold their baby's head for long periods or lift infants with thumbs in the infant's armpit (axilla). PREVENTION  Warm up and stretch properly before practice or competition.  Allow enough time for rest and recovery between practices and competition.  Maintain appropriate conditioning:  Cardiovascular fitness.  Forearm, wrist, and hand flexibility.  Muscle strength and endurance.  Use proper exercise technique. PROGNOSIS  This condition is usually curable within 6 weeks, if treated properly with non-surgical treatment and resting of the affected area.  RELATED COMPLICATIONS   Longer healing time if not properly treated or if not given enough time to heal.  Chronic inflammation, causing recurring symptoms of tenosynovitis. Permanent pain or restriction of movement.  Risks of surgery: infection, bleeding, injury to nerves (numbness of the thumb), continued pain, incomplete release of the tendon sheath, recurring symptoms, cutting of the tendons, tendons sliding out of position, weakness of the thumb, thumb stiffness. TREATMENT  First, treatment involves the use of medicine and ice, to reduce pain and inflammation. Patients are encouraged to stop or modify activities that aggravate the injury. Stretching and strengthening exercises may be advised. Exercises may be completed at home or with a therapist. You may be fitted with a brace or splint, to limit motion and allow the injury to heal. Your caregiver may also choose to give you a corticosteroid injection, to reduce the pain and inflammation. If non-surgical treatment is not successful, surgery may be needed. Most tenosynovitis surgeries are done as outpatient procedures (you go home the   same day). Surgery may involve local, regional (whole arm), or general anesthesia.  MEDICATION   If pain medicine is needed, nonsteroidal anti-inflammatory medicines (aspirin and ibuprofen), or other minor pain  relievers (acetaminophen), are often advised.  Do not take pain medicine for 7 days before surgery.  Prescription pain relievers are often prescribed only after surgery. Use only as directed and only as much as you need.  Corticosteroid injections may be given if your caregiver thinks they are needed. There is a limited number of times these injections may be given. COLD THERAPY   Cold treatment (icing) should be applied for 10 to 15 minutes every 2 to 3 hours for inflammation and pain, and immediately after activity that aggravates your symptoms. Use ice packs or an ice massage. SEEK MEDICAL CARE IF:   Symptoms get worse or do not improve in 2 to 4 weeks, despite treatment.  You experience pain, numbness, or coldness in the hand.  Blue, gray, or dark color appears in the fingernails.  Any of the following occur after surgery: increased pain, swelling, redness, drainage of fluids, bleeding in the affected area, or signs of infection.  New, unexplained symptoms develop. (Drugs used in treatment may produce side effects.) Document Released: 10/12/2005 Document Revised: 01/04/2012 Document Reviewed: 01/24/2009 [employer] Patient Information 2015 [employer]. This information is not intended to replace advice given to you by your health care provider. Make sure you discuss any questions you have with your health care provider.   

## 2014-07-09 ENCOUNTER — Other Ambulatory Visit: Payer: Self-pay | Admitting: Emergency Medicine

## 2014-07-09 MED ORDER — LEVOTHYROXINE SODIUM 75 MCG PO TABS: 75.0000 ug | ORAL_TABLET | Freq: Every day | ORAL | Status: DC

## 2014-07-14 ENCOUNTER — Other Ambulatory Visit: Payer: Self-pay | Admitting: Family Medicine

## 2014-07-16 ENCOUNTER — Other Ambulatory Visit: Payer: Self-pay | Admitting: Family Medicine

## 2014-07-17 NOTE — Telephone Encounter (Signed)
Pt needs refill of omeprazole (PRILOSEC) 20 MG capsule to [facility] in [place]

## 2014-08-22 ENCOUNTER — Ambulatory Visit (INDEPENDENT_AMBULATORY_CARE_PROVIDER_SITE_OTHER): Payer: Managed Care, Other (non HMO) | Admitting: Physician Assistant

## 2014-08-22 VITALS — BP 122/70 | HR 88 | Temp 98.7°F | Resp 16 | Ht 66.5 in | Wt 212.0 lb

## 2014-08-22 DIAGNOSIS — R0981 Nasal congestion: Secondary | ICD-10-CM

## 2014-08-22 DIAGNOSIS — J309 Allergic rhinitis, unspecified: Secondary | ICD-10-CM

## 2014-08-22 MED ORDER — OMEPRAZOLE 20 MG PO CPDR: DELAYED_RELEASE_CAPSULE | ORAL | Status: DC

## 2014-08-22 MED ORDER — IPRATROPIUM BROMIDE 0.03 % NA SOLN: 2.0000 | Freq: Two times a day (BID) | NASAL | Status: DC

## 2014-08-22 NOTE — Progress Notes (Signed)
   Subjective:    Patient ID: Erika Cervantes, female    DOB: Jun 29, 1956, 58 y.o.   MRN: [id number]  HPI Patient presents with 3 days of ear pain, sinus pressure, and cough. Sx not progressing and has taken Mucinex for past 2 day. Had itchy eyes last week that improved with Benadryl. Is sleeping at an incline to improve sx at night. Denies fever, myalgias, or sore throat. Is a former smoker and is unsure of h/o of asthma or allergies.   Needs refill of omeprazole and is acid reflux is well controlled with medication. Denies sour brash, heartburn, or GI upset.    Review of Systems  Constitutional: Negative for fever, chills and fatigue.  HENT: Positive for congestion, ear pain, sinus pressure and sneezing. Negative for ear discharge, postnasal drip, rhinorrhea and sore throat.   Eyes: Positive for itching (improved). Negative for pain, discharge, redness and visual disturbance.  Respiratory: Positive for cough (sometimes productive) and chest tightness (in a.m.). Negative for wheezing.   Cardiovascular: Negative for chest pain and palpitations.  Gastrointestinal: Negative for nausea, vomiting, abdominal pain, diarrhea, constipation and blood in stool.  Musculoskeletal: Negative for myalgias and neck stiffness.  Skin: Negative for rash.  Neurological: Positive for headaches. Negative for dizziness and light-headedness.  Hematological: Negative for adenopathy.       Objective:   Physical Exam  Constitutional: She is oriented to person, place, and time. She appears well-developed and well-nourished.  Blood pressure 122/70, pulse 88, temperature 98.7 F (37.1 C), temperature source Oral, resp. rate 16, height 5' 6.5" (1.689 m), weight 212 lb (96.163 kg), SpO2 98.00%.   HENT:  Head: Normocephalic and atraumatic.  Right Ear: Tympanic membrane and external ear normal. No drainage, swelling or tenderness. No middle ear effusion.  Left Ear: Tympanic membrane and external ear normal. No drainage,  swelling or tenderness.  No middle ear effusion.  Nose: Mucosal edema and rhinorrhea present. No sinus tenderness. Right sinus exhibits maxillary sinus tenderness. Right sinus exhibits no frontal sinus tenderness. Left sinus exhibits maxillary sinus tenderness. Left sinus exhibits no frontal sinus tenderness.  Mouth/Throat: Oropharynx is clear and moist and mucous membranes are normal. No oropharyngeal exudate, posterior oropharyngeal edema or posterior oropharyngeal erythema.  Eyes: Conjunctivae are normal. Pupils are equal, round, and reactive to light. Right eye exhibits no discharge and no exudate. Left eye exhibits no discharge and no exudate.  Neck: Neck supple.  Cardiovascular: Normal rate, regular rhythm and normal heart sounds.  Exam reveals no gallop and no friction rub.   No murmur heard. Pulmonary/Chest: Effort normal and breath sounds normal. No respiratory distress. She has no wheezes. She has no rales. She exhibits no tenderness.  Abdominal: Soft. Bowel sounds are normal. There is no tenderness.  Lymphadenopathy:    She has no cervical adenopathy.  Neurological: She is alert and oriented to person, place, and time.  Skin: Skin is warm and dry. No rash noted. She is not diaphoretic. No erythema. No pallor.       Assessment & Plan:  1. Allergic rhinitis, unspecified allergic rhinitis type 2. Nasal congestion - ipratropium (ATROVENT) 0.03 % nasal spray; Place 2 sprays into both nostrils 2 (two) times daily.  Dispense: 30 mL; Refill: 0    Francisco Eyerly PA-C  [facility] 08/22/2014 4:02 PM

## 2014-08-22 NOTE — Progress Notes (Signed)
I have discussed this case with Ms. Ricki Miller, PA-C and agree.

## 2014-11-26 ENCOUNTER — Other Ambulatory Visit: Payer: Self-pay

## 2014-11-26 MED ORDER — OMEPRAZOLE 20 MG PO CPDR: DELAYED_RELEASE_CAPSULE | ORAL | Status: DC

## 2015-01-07 ENCOUNTER — Other Ambulatory Visit: Payer: Self-pay | Admitting: Emergency Medicine

## 2015-01-13 ENCOUNTER — Other Ambulatory Visit: Payer: Self-pay | Admitting: Physician Assistant

## 2015-02-02 ENCOUNTER — Ambulatory Visit (INDEPENDENT_AMBULATORY_CARE_PROVIDER_SITE_OTHER): Payer: Managed Care, Other (non HMO) | Admitting: Family Medicine

## 2015-02-02 VITALS — BP 132/72 | HR 82 | Temp 98.1°F | Resp 16 | Ht 66.0 in | Wt 208.6 lb

## 2015-02-02 DIAGNOSIS — E785 Hyperlipidemia, unspecified: Secondary | ICD-10-CM

## 2015-02-02 DIAGNOSIS — E038 Other specified hypothyroidism: Secondary | ICD-10-CM

## 2015-02-02 DIAGNOSIS — K219 Gastro-esophageal reflux disease without esophagitis: Secondary | ICD-10-CM | POA: Diagnosis not present

## 2015-02-02 DIAGNOSIS — R5383 Other fatigue: Secondary | ICD-10-CM

## 2015-02-02 DIAGNOSIS — I10 Essential (primary) hypertension: Secondary | ICD-10-CM

## 2015-02-02 DIAGNOSIS — M6530 Trigger finger, unspecified finger: Secondary | ICD-10-CM

## 2015-02-02 DIAGNOSIS — M79645 Pain in left finger(s): Secondary | ICD-10-CM | POA: Diagnosis not present

## 2015-02-02 LAB — COMPREHENSIVE METABOLIC PANEL
ALT: 14 U/L (ref 0–35)
AST: 20 U/L (ref 0–37)
Albumin: 4.2 g/dL (ref 3.5–5.2)
Alkaline Phosphatase: 44 U/L (ref 39–117)
BUN: 14 mg/dL (ref 6–23)
CO2: 28 mEq/L (ref 19–32)
Calcium: 9.8 mg/dL (ref 8.4–10.5)
Chloride: 102 mEq/L (ref 96–112)
Creat: 1.09 mg/dL (ref 0.50–1.10)
Glucose, Bld: 91 mg/dL (ref 70–99)
Potassium: 4.4 mEq/L (ref 3.5–5.3)
Sodium: 137 mEq/L (ref 135–145)
Total Bilirubin: 0.6 mg/dL (ref 0.2–1.2)
Total Protein: 8.2 g/dL (ref 6.0–8.3)

## 2015-02-02 LAB — POCT CBC
Granulocyte percent: 63.9 %G (ref 37–80)
HCT, POC: 38.6 % (ref 37.7–47.9)
Hemoglobin: 12.5 g/dL (ref 12.2–16.2)
Lymph, poc: 2.3 (ref 0.6–3.4)
MCH, POC: 26.5 pg — AB (ref 27–31.2)
MCHC: 32.5 g/dL (ref 31.8–35.4)
MCV: 81.3 fL (ref 80–97)
MID (cbc): 0.4 (ref 0–0.9)
MPV: 8.7 fL (ref 0–99.8)
POC Granulocyte: 4.8 (ref 2–6.9)
POC LYMPH PERCENT: 30.6 %L (ref 10–50)
POC MID %: 5.5 %M (ref 0–12)
Platelet Count, POC: 293 10*3/uL (ref 142–424)
RBC: 4.74 M/uL (ref 4.04–5.48)
RDW, POC: 15 %
WBC: 7.5 10*3/uL (ref 4.6–10.2)

## 2015-02-02 LAB — LIPID PANEL
Cholesterol: 228 mg/dL — ABNORMAL HIGH (ref 0–200)
HDL: 35 mg/dL — ABNORMAL LOW (ref >=46)
LDL Cholesterol: 151 mg/dL — ABNORMAL HIGH (ref 0–99)
Total CHOL/HDL Ratio: 6.5 Ratio
Triglycerides: 211 mg/dL — ABNORMAL HIGH (ref <150)
VLDL: 42 mg/dL — ABNORMAL HIGH (ref 0–40)

## 2015-02-02 LAB — TSH: TSH: 10.987 u[IU]/mL — ABNORMAL HIGH (ref 0.350–4.500)

## 2015-02-02 LAB — POCT GLYCOSYLATED HEMOGLOBIN (HGB A1C): Hemoglobin A1C: 5.8

## 2015-02-02 MED ORDER — DICLOFENAC SODIUM 75 MG PO TBEC: 75.0000 mg | DELAYED_RELEASE_TABLET | Freq: Two times a day (BID) | ORAL | Status: DC

## 2015-02-02 MED ORDER — LISINOPRIL-HYDROCHLOROTHIAZIDE 10-12.5 MG PO TABS: 1.0000 | ORAL_TABLET | Freq: Every day | ORAL | Status: DC

## 2015-02-02 MED ORDER — LEVOTHYROXINE SODIUM 75 MCG PO TABS: ORAL_TABLET | ORAL | Status: DC

## 2015-02-02 MED ORDER — OMEPRAZOLE 20 MG PO CPDR: DELAYED_RELEASE_CAPSULE | ORAL | Status: DC

## 2015-02-02 NOTE — Progress Notes (Addendum)
Subjective: 59 year old lady who is here for a refill of her medication. However she says she just been getting more and more fatigued. She just doesn't have any get up and go. She has been on the same medications for a long time. She volunteers for hospice. She gets some exercise which he takes regular walking or exercise for self however. She is not having headaches or dizziness. No chest pains or palpitations. No excessive shortness of breath. No GI or GU complaints. Gets her GYN exams and breast exams. Is due for mammogram. She has not noticed any other major problems. She does complain of a trigger finger on her left thumb. She says she had a little bit in the right but that went away. She thought this was carpal tunnel syndrome.  Objective: Overweight lady in no acute distress. TMs normal. Throat clear. Neck supple without nodes or thyromegaly. Chest is clear to auscultation. Heart regular without murmurs gallops or arrhythmias. And soft without mass or tenderness. Extremities without edema.  Assessment: Hypothyroidism Hypertension Hyperlipidemia Generalized malaise Trigger finger  Plan: EKG, CBC, hemoglobin A1c, C met, TSH  Results for orders placed or performed in visit on 02/02/15  POCT CBC  Result Value Ref Range   WBC 7.5 4.6 - 10.2 K/uL   Lymph, poc 2.3 0.6 - 3.4   POC LYMPH PERCENT 30.6 10 - 50 %L   MID (cbc) 0.4 0 - 0.9   POC MID % 5.5 0 - 12 %M   POC Granulocyte 4.8 2 - 6.9   Granulocyte percent 63.9 37 - 80 %G   RBC 4.74 4.04 - 5.48 M/uL   Hemoglobin 12.5 12.2 - 16.2 g/dL   HCT, POC 38.6 37.7 - 47.9 %   MCV 81.3 80 - 97 fL   MCH, POC 26.5 (A) 27 - 31.2 pg   MCHC 32.5 31.8 - 35.4 g/dL   RDW, POC 15.0 %   Platelet Count, POC 293 142 - 424 K/uL   MPV 8.7 0 - 99.8 fL  POCT glycosylated hemoglobin (Hb A1C)  Result Value Ref Range   Hemoglobin A1C 5.8    EKG has a poor R-wave progression indicating there could've been an old anterior MI but nothing appears acute. No  history consistent with an event.  Plan: Await the results of her remaining labs Encourage more regular exercise Continue current medications for now Offer to inject the trigger finger.  Procedure note: Using sterile technique the left thumb at the palmar surface of the DIP joint was injected where the nodule area of the trigger finger could be palpated. This was done after ethyl chloride spray using 10 mg of Kenalog with 1/4 mL of 2% lidocaine. Patient tolerated the procedure well. She was instructed in care. She is to return if not doing better. She is to wear the splint for 2 weeks and take diclofenac one twice daily if tolerated by her stomach.

## 2015-02-02 NOTE — Patient Instructions (Signed)
Continue your blood pressure, thyroid, and stomach medications  Take the diclofenac 75 mg 1 twice daily for pain and inflammation and thumb. Stop taking it if it causes stomach irritation. Take it with food.  Try to wear the splint on the thumb for about 2 weeks. However take it off about once a day and exercise the thumb a little bit.  Apply ice several times daily today and tomorrow which will help keep down the pain and inflammation caused by the injection  Return if not improving or if getting worse. If it does not improve at all we may end up having to send you to a hand surgeon and sometimes they will reinjected or sometimes they will have to do surgery.  Return as needed or in about 6 months to follow-up on your blood pressure and thyroid.

## 2015-02-14 MED ORDER — LEVOTHYROXINE SODIUM 100 MCG PO TABS: 100.0000 ug | ORAL_TABLET | Freq: Every day | ORAL | Status: DC

## 2015-02-14 NOTE — Addendum Note (Signed)
Addended by: Constance Goltz on: 02/14/2015 12:00 PM   Modules accepted: Orders

## 2015-05-16 ENCOUNTER — Other Ambulatory Visit: Payer: Self-pay | Admitting: Family Medicine

## 2015-05-17 ENCOUNTER — Other Ambulatory Visit: Payer: Self-pay | Admitting: Family Medicine

## 2015-05-27 ENCOUNTER — Encounter: Payer: Self-pay | Admitting: *Deleted

## 2015-07-10 ENCOUNTER — Ambulatory Visit (INDEPENDENT_AMBULATORY_CARE_PROVIDER_SITE_OTHER): Payer: Managed Care, Other (non HMO) | Admitting: Family Medicine

## 2015-07-10 VITALS — BP 144/78 | HR 94 | Temp 98.4°F | Resp 16 | Ht 65.0 in | Wt 208.0 lb

## 2015-07-10 DIAGNOSIS — E785 Hyperlipidemia, unspecified: Secondary | ICD-10-CM

## 2015-07-10 DIAGNOSIS — K219 Gastro-esophageal reflux disease without esophagitis: Secondary | ICD-10-CM

## 2015-07-10 DIAGNOSIS — Z23 Encounter for immunization: Secondary | ICD-10-CM

## 2015-07-10 DIAGNOSIS — E039 Hypothyroidism, unspecified: Secondary | ICD-10-CM | POA: Diagnosis not present

## 2015-07-10 DIAGNOSIS — I10 Essential (primary) hypertension: Secondary | ICD-10-CM

## 2015-07-10 LAB — TSH: TSH: 3.791 u[IU]/mL (ref 0.350–4.500)

## 2015-07-10 MED ORDER — LISINOPRIL-HYDROCHLOROTHIAZIDE 10-12.5 MG PO TABS: 1.0000 | ORAL_TABLET | Freq: Every day | ORAL | Status: DC

## 2015-07-10 MED ORDER — OMEPRAZOLE 20 MG PO CPDR: DELAYED_RELEASE_CAPSULE | ORAL | Status: DC

## 2015-07-10 NOTE — Progress Notes (Signed)
Chief Complaint:  Chief Complaint  Patient presents with  . Medication Refill    lisinopril, synthroid, prilosec   . Flu Vaccine    pt wants     HPI: Erika Cervantes is a 59 y.o. female who [facility] to [facility] today complaining of : She is doing well. She has been without her medicines except her thyroid medicines for about less than a week.  1. Hypothyroid-hot flashes but she has had it for a while. Denies depression, hair loss, dry skin. She denies CP or palpitations, sleep issues.  2. HTN-well controlled, no sxs, does not check, last took meds about 2-3 days ago 3. Lipids need to be rechecked  4. Would like flu vaccine  Past Medical History  Diagnosis Date  . GERD (gastroesophageal reflux disease)   . Allergy   . Thyroid disease   . Hypertension    Past Surgical History  Procedure Laterality Date  . Colon surgery    . Tonsillectomy    . Abdominal hysterectomy      Fibroids  . Breast surgery      Right breast mass  . Hammer toes     Social History   Social History  . Marital Status: Married    Spouse Name: N/A  . Number of Children: N/A  . Years of Education: N/A   Social History Main Topics  . Smoking status: Former Smoker    Quit date: 08/22/1997  . Smokeless tobacco: Never Used  . Alcohol Use: No  . Drug Use: No  . Sexual Activity: Not Asked   Other Topics Concern  . None   Social History Narrative   Family History  Problem Relation Age of Onset  . Heart disease Mother    Allergies  Allergen Reactions  . Darvocet [Propoxyphene N-Acetaminophen]     vomit   Prior to Admission medications   Medication Sig Start Date End Date Taking? Authorizing Provider  diclofenac (VOLTAREN) 75 MG EC tablet Take 1 tablet (75 mg total) by mouth 2 (two) times daily. 02/02/15  Yes Posey Boyer, MD  levothyroxine (SYNTHROID, LEVOTHROID) 100 MCG tablet TAKE 1 TABLET (100 MCG TOTAL) BY MOUTH DAILY. 05/17/15  Yes Posey Boyer, MD  levothyroxine (SYNTHROID, LEVOTHROID)  100 MCG tablet TAKE 1 TABLET (100 MCG TOTAL) BY MOUTH DAILY. 05/17/15  Yes Posey Boyer, MD  levothyroxine (SYNTHROID, LEVOTHROID) 100 MCG tablet TAKE 1 TABLET (100 MCG TOTAL) BY MOUTH DAILY. 05/17/15  Yes Posey Boyer, MD  lisinopril-hydrochlorothiazide (PRINZIDE,ZESTORETIC) 10-12.5 MG per tablet Take 1 tablet by mouth daily. 02/02/15  Yes Posey Boyer, MD  omeprazole (PRILOSEC) 20 MG capsule TAKE 1 CAPSULE (20 MG TOTAL) BY MOUTH DAILY. 02/02/15  Yes Posey Boyer, MD     ROS: The patient denies fevers, chills, night sweats, unintentional weight loss, chest pain, palpitations, wheezing, dyspnea on exertion, nausea, vomiting, abdominal pain, dysuria, hematuria, melena, numbness, weakness, or tingling.   All other systems have been reviewed and were otherwise negative with the exception of those mentioned in the HPI and as above.    PHYSICAL EXAM: Filed Vitals:   07/10/15 1120  BP: 144/78  Pulse: 94  Temp: 98.4 F (36.9 C)  Resp: 16   Body mass index is 34.61 kg/(m^2).   General: Alert, no acute distress HEENT:  Normocephalic, atraumatic, oropharynx patent. EOMI, PERRLA, Tm nl, fundo exam normal Cardiovascular:  Regular rate and rhythm, no rubs murmurs or gallops.  No Carotid bruits, radial pulse  intact. No pedal edema.  Respiratory: Clear to auscultation bilaterally.  No wheezes, rales, or rhonchi.  No cyanosis, no use of accessory musculature Abdominal: No organomegaly, abdomen is soft and non-tender, positive bowel sounds. No masses. Skin: No rashes. Neurologic: Facial musculature symmetric. Psychiatric: Patient acts appropriately throughout our interaction. Lymphatic: No cervical or submandibular lymphadenopathy Musculoskeletal: Gait intact. No edema, tenderness   LABS: Results for orders placed or performed in visit on 02/02/15  Comprehensive metabolic panel  Result Value Ref Range   Sodium 137 135 - 145 mEq/L   Potassium 4.4 3.5 - 5.3 mEq/L   Chloride 102 96 - 112 mEq/L    CO2 28 19 - 32 mEq/L   Glucose, Bld 91 70 - 99 mg/dL   BUN 14 6 - 23 mg/dL   Creat 1.09 0.50 - 1.10 mg/dL   Total Bilirubin 0.6 0.2 - 1.2 mg/dL   Alkaline Phosphatase 44 39 - 117 U/L   AST 20 0 - 37 U/L   ALT 14 0 - 35 U/L   Total Protein 8.2 6.0 - 8.3 g/dL   Albumin 4.2 3.5 - 5.2 g/dL   Calcium 9.8 8.4 - 10.5 mg/dL  Lipid panel  Result Value Ref Range   Cholesterol 228 (H) 0 - 200 mg/dL   Triglycerides 211 (H) <150 mg/dL   HDL 35 (L) >=46 mg/dL   Total CHOL/HDL Ratio 6.5 Ratio   VLDL 42 (H) 0 - 40 mg/dL   LDL Cholesterol 151 (H) 0 - 99 mg/dL  TSH  Result Value Ref Range   TSH 10.987 (H) 0.350 - 4.500 uIU/mL  POCT CBC  Result Value Ref Range   WBC 7.5 4.6 - 10.2 K/uL   Lymph, poc 2.3 0.6 - 3.4   POC LYMPH PERCENT 30.6 10 - 50 %L   MID (cbc) 0.4 0 - 0.9   POC MID % 5.5 0 - 12 %M   POC Granulocyte 4.8 2 - 6.9   Granulocyte percent 63.9 37 - 80 %G   RBC 4.74 4.04 - 5.48 M/uL   Hemoglobin 12.5 12.2 - 16.2 g/dL   HCT, POC 38.6 37.7 - 47.9 %   MCV 81.3 80 - 97 fL   MCH, POC 26.5 (A) 27 - 31.2 pg   MCHC 32.5 31.8 - 35.4 g/dL   RDW, POC 15.0 %   Platelet Count, POC 293 142 - 424 K/uL   MPV 8.7 0 - 99.8 fL  POCT glycosylated hemoglobin (Hb A1C)  Result Value Ref Range   Hemoglobin A1C 5.8      EKG/XRAY:   Primary read interpreted by Dr. Marin Comment at [facility].   ASSESSMENT/PLAN: Encounter Diagnoses  Name Primary?  . Essential hypertension Yes  . Hypothyroidism, unspecified hypothyroidism type   . Hyperlipidemia with target LDL less than 100   . Gastroesophageal reflux disease without esophagitis    Will await thyroid test before refill levothyroxine, refill meds for 1 year Flu vaccine given Labs pending Fu in 6 months    Gross sideeffects, risk and benefits, and alternatives of medications d/w patient. Patient is aware that all medications have potential sideeffects and we are unable to predict every sideeffect or drug-drug interaction that may occur.  Thao Le DO    07/10/2015 12:02 PM

## 2015-07-10 NOTE — Patient Instructions (Addendum)
Hypothyroidism The thyroid is a large gland located in the lower front of your neck. The thyroid gland helps control metabolism. Metabolism is how your body handles food. It controls metabolism with the hormone thyroxine. When this gland is underactive (hypothyroid), it produces too little hormone.  CAUSES These include:   Absence or destruction of thyroid tissue.  Goiter due to iodine deficiency.  Goiter due to medications.  Congenital defects (since birth).  Problems with the pituitary. This causes a lack of TSH (thyroid stimulating hormone). This hormone tells the thyroid to turn out more hormone. SYMPTOMS  Lethargy (feeling as though you have no energy)  Cold intolerance  Weight gain (in spite of normal food intake)  Dry skin  Coarse hair  Menstrual irregularity (if severe, may lead to infertility)  Slowing of thought processes Cardiac problems are also caused by insufficient amounts of thyroid hormone. Hypothyroidism in the newborn is cretinism, and is an extreme form. It is important that this form be treated adequately and immediately or it will lead rapidly to retarded physical and mental development. DIAGNOSIS  To prove hypothyroidism, your caregiver may do blood tests and ultrasound tests. Sometimes the signs are hidden. It may be necessary for your caregiver to watch this illness with blood tests either before or after diagnosis and treatment. TREATMENT  Low levels of thyroid hormone are increased by using synthetic thyroid hormone. This is a safe, effective treatment. It usually takes about four weeks to gain the full effects of the medication. After you have the full effect of the medication, it will generally take another four weeks for problems to leave. Your caregiver may start you on low doses. If you have had heart problems the dose may be gradually increased. It is generally not an emergency to get rapidly to normal. HOME CARE INSTRUCTIONS   Take your  medications as your caregiver suggests. Let your caregiver know of any medications you are taking or start taking. Your caregiver will help you with dosage schedules.  As your condition improves, your dosage needs may increase. It will be necessary to have continuing blood tests as suggested by your caregiver.  Report all suspected medication side effects to your caregiver. SEEK MEDICAL CARE IF: Seek medical care if you develop:  Sweating.  Tremulousness (tremors).  Anxiety.  Rapid weight loss.  Heat intolerance.  Emotional swings.  Diarrhea.  Weakness. SEEK IMMEDIATE MEDICAL CARE IF:  You develop chest pain, an irregular heart beat (palpitations), or a rapid heart beat. MAKE SURE YOU:   Understand these instructions.  Will watch your condition.  Will get help right away if you are not doing well or get worse. Document Released: 10/12/2005 Document Revised: 01/04/2012 Document Reviewed: 06/01/2008 [facility] Patient Information 2015 [place], [place]. This information is not intended to replace advice given to you by your health care provider. Make sure you discuss any questions you have with your health care provider. Hypertension Hypertension is another name for high blood pressure. High blood pressure forces your heart to work harder to pump blood. A blood pressure reading has two numbers, which includes a higher number over a lower number (example: 110/72). HOME CARE   Have your blood pressure rechecked by your doctor.  Only take medicine as told by your doctor. Follow the directions carefully. The medicine does not work as well if you skip doses. Skipping doses also puts you at risk for problems.  Do not smoke.  Monitor your blood pressure at home as told by your doctor.  GET HELP IF:  You think you are having a reaction to the medicine you are taking.  You have repeat headaches or feel dizzy.  You have puffiness (swelling) in your ankles.  You have trouble with  your vision. GET HELP RIGHT AWAY IF:   You get a very bad headache and are confused.  You feel weak, numb, or faint.  You get chest or belly (abdominal) pain.  You throw up (vomit).  You cannot breathe very well. MAKE SURE YOU:   Understand these instructions.  Will watch your condition.  Will get help right away if you are not doing well or get worse. Document Released: 03/30/2008 Document Revised: 10/17/2013 Document Reviewed: 08/04/2013 [facility] Patient Information 2015 [place], [place]. This information is not intended to replace advice given to you by your health care provider. Make sure you discuss any questions you have with your health care provider. Influenza Virus Vaccine injection (Fluarix) What is this medicine? INFLUENZA VIRUS VACCINE (in floo EN zuh VAHY ruhs vak SEEN) helps to reduce the risk of getting influenza also known as the flu. This medicine may be used for other purposes; ask your health care provider or pharmacist if you have questions. COMMON BRAND NAME(S): Fluarix, Fluzone What should I tell my health care provider before I take this medicine? They need to know if you have any of these conditions: -bleeding disorder like hemophilia -fever or infection -Guillain-Barre syndrome or other neurological problems -immune system problems -infection with the human immunodeficiency virus (HIV) or AIDS -low blood platelet counts -multiple sclerosis -an unusual or allergic reaction to influenza virus vaccine, eggs, chicken proteins, latex, gentamicin, other medicines, foods, dyes or preservatives -pregnant or trying to get pregnant -breast-feeding How should I use this medicine? This vaccine is for injection into a muscle. It is given by a health care professional. A copy of Vaccine Information Statements will be given before each vaccination. Read this sheet carefully each time. The sheet may change frequently. Talk to your pediatrician regarding the use of  this medicine in children. Special care may be needed. Overdosage: If you think you have taken too much of this medicine contact a poison control center or emergency room at once. NOTE: This medicine is only for you. Do not share this medicine with others. What if I miss a dose? This does not apply. What may interact with this medicine? -chemotherapy or radiation therapy -medicines that lower your immune system like etanercept, anakinra, infliximab, and adalimumab -medicines that treat or prevent blood clots like warfarin -phenytoin -steroid medicines like prednisone or cortisone -theophylline -vaccines This list may not describe all possible interactions. Give your health care provider a list of all the medicines, herbs, non-prescription drugs, or dietary supplements you use. Also tell them if you smoke, drink alcohol, or use illegal drugs. Some items may interact with your medicine. What should I watch for while using this medicine? Report any side effects that do not go away within 3 days to your doctor or health care professional. Call your health care provider if any unusual symptoms occur within 6 weeks of receiving this vaccine. You may still catch the flu, but the illness is not usually as bad. You cannot get the flu from the vaccine. The vaccine will not protect against colds or other illnesses that may cause fever. The vaccine is needed every year. What side effects may I notice from receiving this medicine? Side effects that you should report to your doctor or health care professional as  soon as possible: -allergic reactions like skin rash, itching or hives, swelling of the face, lips, or tongue Side effects that usually do not require medical attention (report to your doctor or health care professional if they continue or are bothersome): -fever -headache -muscle aches and pains -pain, tenderness, redness, or swelling at site where injected -weak or tired This list may not  describe all possible side effects. Call your doctor for medical advice about side effects. You may report side effects to FDA at 1-800-FDA-1088. Where should I keep my medicine? This vaccine is only given in a clinic, pharmacy, doctor's office, or other health care setting and will not be stored at home. NOTE: This sheet is a summary. It may not cover all possible information. If you have questions about this medicine, talk to your doctor, pharmacist, or health care provider.  2015, [employer]. (2008-05-09 09:30:40)

## 2015-07-11 LAB — LIPID PANEL
Cholesterol: 237 mg/dL — ABNORMAL HIGH (ref 125–200)
HDL: 36 mg/dL — ABNORMAL LOW (ref >=46)
LDL Cholesterol: 161 mg/dL — ABNORMAL HIGH (ref <130)
Total CHOL/HDL Ratio: 6.6 Ratio — ABNORMAL HIGH (ref <=5.0)
Triglycerides: 202 mg/dL — ABNORMAL HIGH (ref <150)
VLDL: 40 mg/dL — ABNORMAL HIGH (ref <30)

## 2015-07-11 LAB — COMPLETE METABOLIC PANEL WITH GFR
ALT: 13 U/L (ref 6–29)
AST: 22 U/L (ref 10–35)
Albumin: 4.2 g/dL (ref 3.6–5.1)
Alkaline Phosphatase: 48 U/L (ref 33–130)
BUN: 14 mg/dL (ref 7–25)
CO2: 25 mmol/L (ref 20–31)
Calcium: 9.7 mg/dL (ref 8.6–10.4)
Chloride: 105 mmol/L (ref 98–110)
Creat: 0.99 mg/dL (ref 0.50–1.05)
GFR, Est African American: 72 mL/min (ref >=60)
GFR, Est Non African American: 63 mL/min (ref >=60)
Glucose, Bld: 83 mg/dL (ref 65–99)
Potassium: 4.3 mmol/L (ref 3.5–5.3)
Sodium: 139 mmol/L (ref 135–146)
Total Bilirubin: 0.5 mg/dL (ref 0.2–1.2)
Total Protein: 7.8 g/dL (ref 6.1–8.1)

## 2015-07-13 ENCOUNTER — Telehealth: Payer: Self-pay | Admitting: Family Medicine

## 2015-07-13 ENCOUNTER — Other Ambulatory Visit: Payer: Self-pay | Admitting: Family Medicine

## 2015-07-13 DIAGNOSIS — E039 Hypothyroidism, unspecified: Secondary | ICD-10-CM

## 2015-07-13 MED ORDER — LEVOTHYROXINE SODIUM 100 MCG PO TABS: 100.0000 ug | ORAL_TABLET | Freq: Every day | ORAL | Status: DC

## 2015-07-13 NOTE — Telephone Encounter (Signed)
LM about labs, refileld thyroid meds, have asked her to return in 3 months to recheck cholesterol, she needs to change diet and be on fishoil and exercise.

## 2015-08-05 ENCOUNTER — Encounter: Payer: Self-pay | Admitting: Family Medicine

## 2015-08-29 ENCOUNTER — Other Ambulatory Visit: Payer: Self-pay | Admitting: Family Medicine

## 2015-09-04 ENCOUNTER — Ambulatory Visit (INDEPENDENT_AMBULATORY_CARE_PROVIDER_SITE_OTHER): Payer: Managed Care, Other (non HMO)

## 2015-09-04 ENCOUNTER — Ambulatory Visit (INDEPENDENT_AMBULATORY_CARE_PROVIDER_SITE_OTHER): Payer: Managed Care, Other (non HMO) | Admitting: Physician Assistant

## 2015-09-04 VITALS — BP 120/62 | HR 99 | Temp 98.6°F | Resp 18 | Ht 65.0 in | Wt 202.0 lb

## 2015-09-04 DIAGNOSIS — M1812 Unilateral primary osteoarthritis of first carpometacarpal joint, left hand: Secondary | ICD-10-CM

## 2015-09-04 DIAGNOSIS — M79645 Pain in left finger(s): Secondary | ICD-10-CM

## 2015-09-04 DIAGNOSIS — M542 Cervicalgia: Secondary | ICD-10-CM

## 2015-09-04 DIAGNOSIS — M1810 Unilateral primary osteoarthritis of first carpometacarpal joint, unspecified hand: Secondary | ICD-10-CM | POA: Insufficient documentation

## 2015-09-04 DIAGNOSIS — M19042 Primary osteoarthritis, left hand: Secondary | ICD-10-CM

## 2015-09-04 MED ORDER — NAPROXEN 500 MG PO TABS: 500.0000 mg | ORAL_TABLET | Freq: Two times a day (BID) | ORAL | Status: AC

## 2015-09-04 MED ORDER — PREDNISONE 20 MG PO TABS: ORAL_TABLET | ORAL | Status: AC

## 2015-09-04 NOTE — Progress Notes (Signed)
09/04/2015 at 6:30 PM  Erika Cervantes / DOB: 1956-09-28 / MRN: [id number]  The patient has Hypothyroidism; Hyperlipidemia with target LDL less than 100; Allergic rhinitis due to allergen; HTN (hypertension); Obesity (BMI 30.0-34.9); Situational anxiety; Bulging lumbar disc; GERD (gastroesophageal reflux disease); and Vitamin D deficiency on her problem list.  SUBJECTIVE  Erika Cervantes is a 59 y.o. female who complains of left sided thumb pain that is made worse by movement.  Describes the pain as "in the bone," and is typically worse in the morning.  Also complains of some locking during adduction.  Has tried prescription NSAID medication for the last week and reports little improvement. She has had this problem before.    Complains of a "crick in the neck" on the left side that started today.  Rotating her head to the left makes the pain worse.  Denies weakness, paresthesia, and changes in sensation of the right upper extremity. No bowel or bladder incontinence.  Denies any trauma and history of neck problems.  States that "bursitis and arthritis" runs in her family.    She  has a past medical history of GERD (gastroesophageal reflux disease); Allergy; Thyroid disease; and Hypertension.    Medications reviewed and updated by myself where necessary, and exist elsewhere in the encounter.   Ms. Levey is allergic to darvocet. She  reports that she quit smoking about 18 years ago. She has never used smokeless tobacco. She reports that she does not drink alcohol or use illicit drugs. She  has no sexual activity history on file. The patient  has past surgical history that includes Colon surgery; Tonsillectomy; Abdominal hysterectomy; Breast surgery; and Hammer toes.  Her family history includes Heart disease in her mother.  Review of Systems  Constitutional: Negative.   Gastrointestinal: Negative for nausea.  Musculoskeletal: Positive for joint pain and neck pain.  Skin: Negative.   Neurological: Negative  for dizziness.  Endo/Heme/Allergies: Negative for polydipsia.    OBJECTIVE  Her  height is 5\' 5"  (1.651 m) and weight is 202 lb (91.627 kg). Her oral temperature is 98.6 F (37 C). Her blood pressure is 120/62 and her pulse is 99. Her respiration is 18 and oxygen saturation is 98%.  The patient's body mass index is 33.61 kg/(m^2).  Physical Exam  Vitals reviewed. Constitutional: She is oriented to person, place, and time.  Cardiovascular: Normal rate.   Respiratory: Effort normal.  Musculoskeletal:       Left hand: She exhibits normal range of motion. Normal strength noted. She exhibits no thumb/finger opposition.       Hands: Neurological: She is alert and oriented to person, place, and time. She has normal strength. No cranial nerve deficit or sensory deficit. Gait normal.  Reflex Scores:      Tricep reflexes are 2+ on the left side.      Bicep reflexes are 2+ on the left side.      Brachioradialis reflexes are 2+ on the left side.  Lab Results  Component Value Date   HGBA1C 5.8 02/02/2015    No results found for this or any previous visit (from the past 24 hour(s)).  [facility] reading (PRIMARY) by  PA Clark: STAT read please comment.   ASSESSMENT & PLAN  Lindora was seen today for shoulder pain and hand pain.  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 She has failed NSAID medication.  Will try oral steroids  as this should improve problem one and two.  Advised patient to wear her wrist brace for two weeks.  Will provide Naprosyn for her to start and take for two weeks after she stops the prednisone.  If this fails will consider injection/referral.  -     DG Finger Thumb Left; Future  Neck pain: No alarm symptoms.  Managed with problem 1.    The patient was advised to call or come back to clinic if she does not see an improvement in symptoms, or worsens with the above plan.   Philis Fendt, MHS, PA-C [facility] 09/04/2015 6:30 PM

## 2015-09-04 NOTE — Patient Instructions (Signed)
Wear your splint for three weeks.  You may take it off for bathing and washing purposes.  Please take the medication as prescribed on the bottle.

## 2015-09-05 NOTE — Addendum Note (Signed)
Addended by: Tereasa Coop on: 09/05/2015 12:09 AM   Modules accepted: Miquel Dunn

## 2015-09-13 ENCOUNTER — Other Ambulatory Visit: Payer: Self-pay | Admitting: Family Medicine

## 2015-09-16 NOTE — Telephone Encounter (Signed)
Erika Cervantes, this was originally Rxd by Dr Linna Darner for pt's thumb, but see that you gave pt round of pred and then short treatment of naproxen. Do you want to give RFs of diclofenac?

## 2015-10-02 ENCOUNTER — Other Ambulatory Visit: Payer: Self-pay | Admitting: Physician Assistant

## 2015-10-03 ENCOUNTER — Other Ambulatory Visit: Payer: Self-pay | Admitting: Physician Assistant

## 2015-11-01 ENCOUNTER — Ambulatory Visit (INDEPENDENT_AMBULATORY_CARE_PROVIDER_SITE_OTHER): Payer: Managed Care, Other (non HMO) | Admitting: Family Medicine

## 2015-11-01 VITALS — BP 120/72 | HR 84 | Temp 98.4°F | Resp 18 | Ht 66.75 in | Wt 200.8 lb

## 2015-11-01 DIAGNOSIS — J069 Acute upper respiratory infection, unspecified: Secondary | ICD-10-CM | POA: Diagnosis not present

## 2015-11-01 DIAGNOSIS — E039 Hypothyroidism, unspecified: Secondary | ICD-10-CM

## 2015-11-01 DIAGNOSIS — E785 Hyperlipidemia, unspecified: Secondary | ICD-10-CM

## 2015-11-01 DIAGNOSIS — R6889 Other general symptoms and signs: Secondary | ICD-10-CM | POA: Diagnosis not present

## 2015-11-01 DIAGNOSIS — K219 Gastro-esophageal reflux disease without esophagitis: Secondary | ICD-10-CM

## 2015-11-01 DIAGNOSIS — R059 Cough, unspecified: Secondary | ICD-10-CM

## 2015-11-01 DIAGNOSIS — R0981 Nasal congestion: Secondary | ICD-10-CM | POA: Diagnosis not present

## 2015-11-01 DIAGNOSIS — R05 Cough: Secondary | ICD-10-CM | POA: Diagnosis not present

## 2015-11-01 LAB — COMPLETE METABOLIC PANEL WITH GFR
ALT: 11 U/L (ref 6–29)
AST: 19 U/L (ref 10–35)
Albumin: 3.9 g/dL (ref 3.6–5.1)
Alkaline Phosphatase: 46 U/L (ref 33–130)
BUN: 16 mg/dL (ref 7–25)
CO2: 28 mmol/L (ref 20–31)
Calcium: 9.4 mg/dL (ref 8.6–10.4)
Chloride: 104 mmol/L (ref 98–110)
Creat: 0.95 mg/dL (ref 0.50–1.05)
GFR, Est African American: 76 mL/min (ref >=60)
GFR, Est Non African American: 66 mL/min (ref >=60)
Glucose, Bld: 91 mg/dL (ref 65–99)
Potassium: 4.2 mmol/L (ref 3.5–5.3)
Sodium: 138 mmol/L (ref 135–146)
Total Bilirubin: 0.4 mg/dL (ref 0.2–1.2)
Total Protein: 7.1 g/dL (ref 6.1–8.1)

## 2015-11-01 LAB — LIPID PANEL
Cholesterol: 208 mg/dL — ABNORMAL HIGH (ref 125–200)
HDL: 39 mg/dL — ABNORMAL LOW (ref >=46)
LDL Cholesterol: 133 mg/dL — ABNORMAL HIGH (ref <130)
Total CHOL/HDL Ratio: 5.3 Ratio — ABNORMAL HIGH (ref <=5.0)
Triglycerides: 181 mg/dL — ABNORMAL HIGH (ref <150)
VLDL: 36 mg/dL — ABNORMAL HIGH (ref <30)

## 2015-11-01 LAB — POCT INFLUENZA A/B
Influenza A, POC: NEGATIVE
Influenza B, POC: NEGATIVE

## 2015-11-01 MED ORDER — ALBUTEROL SULFATE HFA 108 (90 BASE) MCG/ACT IN AERS: 2.0000 | INHALATION_SPRAY | Freq: Four times a day (QID) | RESPIRATORY_TRACT | Status: DC | PRN

## 2015-11-01 MED ORDER — AMOXICILLIN-POT CLAVULANATE 875-125 MG PO TABS: 1.0000 | ORAL_TABLET | Freq: Two times a day (BID) | ORAL | Status: DC

## 2015-11-01 MED ORDER — HYDROCODONE-HOMATROPINE 5-1.5 MG/5ML PO SYRP: 5.0000 mL | ORAL_SOLUTION | Freq: Every evening | ORAL | Status: DC | PRN

## 2015-11-01 MED ORDER — BENZONATATE 200 MG PO CAPS: 200.0000 mg | ORAL_CAPSULE | Freq: Three times a day (TID) | ORAL | Status: DC | PRN

## 2015-11-01 MED ORDER — ALBUTEROL SULFATE (2.5 MG/3ML) 0.083% IN NEBU
2.5000 mg | INHALATION_SOLUTION | Freq: Once | RESPIRATORY_TRACT | Status: AC
  Administered 2015-11-01: 2.5 mg via RESPIRATORY_TRACT

## 2015-11-01 MED ORDER — IPRATROPIUM BROMIDE 0.02 % IN SOLN
0.5000 mg | Freq: Once | RESPIRATORY_TRACT | Status: AC
  Administered 2015-11-01: 0.5 mg via RESPIRATORY_TRACT

## 2015-11-01 NOTE — Progress Notes (Signed)
Chief Complaint:  Chief Complaint  Patient presents with  . Sinusitis    headache, scratchy throat and fatigue. Started yesterday.     HPI: Erika Cervantes is a 60 y.o. female who reports to [facility] today complaining of feeling  tired sluggish , nauseated, she has a tickle  in her throat, she ahs coughing, nothing is coming up,  She has some tightness in her chest, no wheezing, no SOB. She has GERD and allergies.  She  Also wants to get her cholesterol rechecked. She wants her arthritis meds refilled.   Past Medical History  Diagnosis Date  . GERD (gastroesophageal reflux disease)   . Allergy   . Thyroid disease   . Hypertension    Past Surgical History  Procedure Laterality Date  . Colon surgery    . Tonsillectomy    . Abdominal hysterectomy      Fibroids  . Breast surgery      Right breast mass  . Hammer toes     Social History   Social History  . Marital Status: Married    Spouse Name: N/A  . Number of Children: N/A  . Years of Education: N/A   Social History Main Topics  . Smoking status: Former Smoker    Quit date: 08/22/1997  . Smokeless tobacco: Never Used  . Alcohol Use: No  . Drug Use: No  . Sexual Activity: Not Asked   Other Topics Concern  . None   Social History Narrative   Family History  Problem Relation Age of Onset  . Heart disease Mother    Allergies  Allergen Reactions  . Darvocet [Propoxyphene N-Acetaminophen]     vomit   Prior to Admission medications   Medication Sig Start Date End Date Taking? Authorizing Provider  diclofenac (VOLTAREN) 75 MG EC tablet TAKE 1 TABLET (75 MG TOTAL) BY MOUTH 2 (TWO) TIMES DAILY. 09/17/15  Yes Tereasa Coop, PA-C  levothyroxine (SYNTHROID, LEVOTHROID) 100 MCG tablet Take 1 tablet (100 mcg total) by mouth daily before breakfast. 07/13/15  Yes Lakita Sahlin P Danaka Llera, DO  lisinopril-hydrochlorothiazide (PRINZIDE,ZESTORETIC) 10-12.5 MG per tablet Take 1 tablet by mouth daily. 07/10/15  Yes Alcide Memoli P Susanna Benge, DO  omeprazole  (PRILOSEC) 20 MG capsule TAKE 1 CAPSULE (20 MG TOTAL) BY MOUTH DAILY. 07/10/15  Yes Diondre Pulis P Rider Ermis, DO     ROS: The patient denies fevers, chills, night sweats, unintentional weight loss, chest pain, palpitations, wheezing, dyspnea on exertion, nausea, vomiting, abdominal pain, dysuria, hematuria, melena, numbness, weakness, or tingling.  All other systems have been reviewed and were otherwise negative with the exception of those mentioned in the HPI and as above.    PHYSICAL EXAM: Filed Vitals:   11/01/15 1102  BP: 120/72  Pulse: 84  Temp: 98.4 F (36.9 C)  Resp: 18   Body mass index is 31.7 kg/(m^2).   General: Alert, no acute distress HEENT:  Normocephalic, atraumatic, oropharynx patent. EOMI, PERRLA Erythematous throat, no exudates, TM normal, + sinus tenderness, + erythematous/boggy nasal mucosa Cardiovascular:  Regular rate and rhythm, no rubs murmurs or gallops.  No Carotid bruits, radial pulse intact. No pedal edema.  Respiratory: Clear to auscultation bilaterally.  No wheezes, rales, or rhonchi.  No cyanosis, no use of accessory musculature. Abdominal: No organomegaly, abdomen is soft and non-tender, positive bowel sounds. No masses. Skin: No rashes. Neurologic: Facial musculature symmetric. Psychiatric: Patient acts appropriately throughout our interaction. Lymphatic: No cervical or submandibular lymphadenopathy Musculoskeletal: Gait intact. No edema, tenderness  LABS: Results for orders placed or performed in visit on 11/01/15  POCT Influenza A/B  Result Value Ref Range   Influenza A, POC Negative Negative   Influenza B, POC Negative Negative     EKG/XRAY:   Primary read interpreted by Dr. Marin Comment at [facility].   ASSESSMENT/PLAN: Encounter Diagnoses  Name Primary?  . Nasal congestion Yes  . Cough   . Hypothyroidism, unspecified hypothyroidism type   . Hyperlipidemia with target LDL less than 100   . Gastroesophageal reflux disease without esophagitis   . Flu-like  symptoms    Airflow improved with neb treatment, feels cough is more productive.  Rx albuterol inh, augmentin, tessalon perles prn, hycodan prn  Refilled chronic meds Labs pending Fu prn otherwise in 6 months  Gross sideeffects, risk and benefits, and alternatives of medications d/w patient. Patient is aware that all medications have potential sideeffects and we are unable to predict every sideeffect or drug-drug interaction that may occur.  Paloma Grange DO  11/01/2015 1:35 PM

## 2015-11-19 ENCOUNTER — Ambulatory Visit (INDEPENDENT_AMBULATORY_CARE_PROVIDER_SITE_OTHER): Payer: Managed Care, Other (non HMO) | Admitting: Physician Assistant

## 2015-11-19 VITALS — BP 138/62 | HR 95 | Temp 100.1°F | Resp 18 | Ht 65.5 in | Wt 195.8 lb

## 2015-11-19 DIAGNOSIS — R6889 Other general symptoms and signs: Secondary | ICD-10-CM | POA: Diagnosis not present

## 2015-11-19 DIAGNOSIS — R05 Cough: Secondary | ICD-10-CM | POA: Diagnosis not present

## 2015-11-19 DIAGNOSIS — R51 Headache: Secondary | ICD-10-CM

## 2015-11-19 DIAGNOSIS — M791 Myalgia: Secondary | ICD-10-CM | POA: Diagnosis not present

## 2015-11-19 DIAGNOSIS — R0981 Nasal congestion: Secondary | ICD-10-CM

## 2015-11-19 DIAGNOSIS — R112 Nausea with vomiting, unspecified: Secondary | ICD-10-CM | POA: Diagnosis not present

## 2015-11-19 LAB — POCT INFLUENZA A/B
Influenza A, POC: NEGATIVE
Influenza B, POC: NEGATIVE

## 2015-11-19 MED ORDER — ALBUTEROL SULFATE HFA 108 (90 BASE) MCG/ACT IN AERS: 2.0000 | INHALATION_SPRAY | Freq: Four times a day (QID) | RESPIRATORY_TRACT | Status: DC | PRN

## 2015-11-19 MED ORDER — ONDANSETRON 4 MG PO TBDP: 4.0000 mg | ORAL_TABLET | Freq: Three times a day (TID) | ORAL | Status: DC | PRN

## 2015-11-19 MED ORDER — GUAIFENESIN ER 1200 MG PO TB12: 1.0000 | ORAL_TABLET | Freq: Two times a day (BID) | ORAL | Status: AC

## 2015-11-19 MED ORDER — HYDROCODONE-HOMATROPINE 5-1.5 MG/5ML PO SYRP
5.0000 mL | ORAL_SOLUTION | Freq: Every evening | ORAL | Status: DC | PRN
Start: 2015-11-19 — End: 2016-07-15

## 2015-11-19 NOTE — Progress Notes (Signed)
Erika Cervantes  MRN: [id number] DOB: 02/29/1956  Subjective:  Pt presents to clinic with cold symptoms since yesterday am.  Felt dizzy 1st and then she had an episode of vomiting and the myalgias started. She is having nausea and vomiting. 2 episode this am - she has since developed sinus congestion without rhinorrhea but she thinks she must have PND from the cough that she has that is mostly dry - she is having some wheezing that her albuterol really helps. Sick contacts. Home treatment - none Flu vaccine - recieved  Patient Active Problem List   Diagnosis Date Noted  . Degenerative arthritis of thumb 09/04/2015  . Vitamin D deficiency 05/26/2012  . Hypothyroidism 02/26/2012  . Hyperlipidemia with target LDL less than 100 02/26/2012  . Allergic rhinitis due to allergen 02/26/2012  . HTN (hypertension) 02/26/2012  . Obesity (BMI 30.0-34.9) 02/26/2012  . Situational anxiety 02/26/2012  . Bulging lumbar disc 02/26/2012  . GERD (gastroesophageal reflux disease) 02/26/2012    Current Outpatient Prescriptions on File Prior to Visit  Medication Sig Dispense Refill  . benzonatate (TESSALON) 200 MG capsule Take 1 capsule (200 mg total) by mouth 3 (three) times daily as needed for cough. 30 capsule 0  . diclofenac (VOLTAREN) 75 MG EC tablet TAKE 1 TABLET (75 MG TOTAL) BY MOUTH 2 (TWO) TIMES DAILY. 60 tablet 3  . levothyroxine (SYNTHROID, LEVOTHROID) 100 MCG tablet Take 1 tablet (100 mcg total) by mouth daily before breakfast. 90 tablet 3  . lisinopril-hydrochlorothiazide (PRINZIDE,ZESTORETIC) 10-12.5 MG per tablet Take 1 tablet by mouth daily. 90 tablet 3  . omeprazole (PRILOSEC) 20 MG capsule TAKE 1 CAPSULE (20 MG TOTAL) BY MOUTH DAILY. 90 capsule 3   No current facility-administered medications on file prior to visit.    Allergies  Allergen Reactions  . Darvocet [Propoxyphene N-Acetaminophen]     vomit    Review of Systems  HENT: Positive for congestion and sinus pressure. Negative  for rhinorrhea and sore throat.   Respiratory: Positive for cough (sometimes) and wheezing. Negative for shortness of breath.        Former smoker - 12-13 years ago  Gastrointestinal: Positive for nausea and vomiting. Negative for diarrhea.  Musculoskeletal: Positive for myalgias.  Neurological: Positive for headaches.   Objective:  BP 138/62 mmHg  Pulse 95  Temp(Src) 100.1 F (37.8 C) (Oral)  Resp 18  Ht 5' 5.5" (1.664 m)  Wt 195 lb 12.8 oz (88.814 kg)  BMI 32.08 kg/m2  SpO2 98%  Physical Exam  Constitutional: She is oriented to person, place, and time and well-developed, well-nourished, and in no distress.  HENT:  Head: Normocephalic and atraumatic.  Right Ear: Hearing, tympanic membrane, external ear and ear canal normal.  Left Ear: Hearing, tympanic membrane, external ear and ear canal normal.  Nose: Mucosal edema (red) present.  Mouth/Throat: Uvula is midline, oropharynx is clear and moist and mucous membranes are normal.  Eyes: Conjunctivae are normal.  Neck: Normal range of motion.  Cardiovascular: Normal rate, regular rhythm and normal heart sounds.   No murmur heard. Pulmonary/Chest: Effort normal and breath sounds normal.  Abdominal: Soft. Bowel sounds are normal.  Neurological: She is alert and oriented to person, place, and time. Gait normal.  Skin: Skin is warm and dry.  Psychiatric: Mood, memory, affect and judgment normal.  Vitals reviewed.  Results for orders placed or performed in visit on 11/19/15  POCT Influenza A/B  Result Value Ref Range   Influenza A, POC Negative Negative  Influenza B, POC Negative Negative    Assessment and Plan :  Flu-like symptoms - Plan: POCT Influenza A/B, Care order/instruction   Symptomatic care - and rest for the patient.  Windell Hummingbird PA-C  [facility] 11/19/2015 11:52 AM

## 2015-11-19 NOTE — Patient Instructions (Signed)
Please push fluids.  Tylenol and Motrin for fever and body aches.    A humidifier can help especially when the air is dry -if you do not have a humidifier you can boil a pot of water on the stove in your home to help with the dry air.  Start with ice chips and then sips of clear fluids.  Once you are able to tolerated drinking liquids you can start with bland foods such as rice, apple sauce and toast.  If you can tolerate this you can advance diet as tolerated.  Limit dairy for several days to reduce return of diarrhea if you have been experiencing diarrhea.

## 2016-01-09 ENCOUNTER — Encounter: Payer: Self-pay | Admitting: Family Medicine

## 2016-07-15 ENCOUNTER — Ambulatory Visit (INDEPENDENT_AMBULATORY_CARE_PROVIDER_SITE_OTHER): Payer: Managed Care, Other (non HMO) | Admitting: Family Medicine

## 2016-07-15 VITALS — BP 120/66 | HR 89 | Temp 98.3°F | Ht 65.0 in | Wt 193.0 lb

## 2016-07-15 DIAGNOSIS — E039 Hypothyroidism, unspecified: Secondary | ICD-10-CM | POA: Diagnosis not present

## 2016-07-15 DIAGNOSIS — I10 Essential (primary) hypertension: Secondary | ICD-10-CM | POA: Diagnosis not present

## 2016-07-15 DIAGNOSIS — M79644 Pain in right finger(s): Secondary | ICD-10-CM

## 2016-07-15 DIAGNOSIS — K219 Gastro-esophageal reflux disease without esophagitis: Secondary | ICD-10-CM | POA: Diagnosis not present

## 2016-07-15 DIAGNOSIS — Z23 Encounter for immunization: Secondary | ICD-10-CM | POA: Diagnosis not present

## 2016-07-15 LAB — COMPREHENSIVE METABOLIC PANEL
ALT: 10 U/L (ref 6–29)
AST: 20 U/L (ref 10–35)
Albumin: 4 g/dL (ref 3.6–5.1)
Alkaline Phosphatase: 48 U/L (ref 33–130)
BUN: 15 mg/dL (ref 7–25)
CO2: 25 mmol/L (ref 20–31)
Calcium: 9.8 mg/dL (ref 8.6–10.4)
Chloride: 107 mmol/L (ref 98–110)
Creat: 1 mg/dL — ABNORMAL HIGH (ref 0.50–0.99)
Glucose, Bld: 84 mg/dL (ref 65–99)
Potassium: 4.3 mmol/L (ref 3.5–5.3)
Sodium: 140 mmol/L (ref 135–146)
Total Bilirubin: 0.4 mg/dL (ref 0.2–1.2)
Total Protein: 7.8 g/dL (ref 6.1–8.1)

## 2016-07-15 LAB — TSH: TSH: 15.34 mIU/L — ABNORMAL HIGH

## 2016-07-15 LAB — CBC
HCT: 36.8 % (ref 35.0–45.0)
Hemoglobin: 12.4 g/dL (ref 11.7–15.5)
MCH: 26.6 pg — ABNORMAL LOW (ref 27.0–33.0)
MCHC: 33.7 g/dL (ref 32.0–36.0)
MCV: 79 fL — ABNORMAL LOW (ref 80.0–100.0)
MPV: 10.8 fL (ref 7.5–12.5)
Platelets: 332 10*3/uL (ref 140–400)
RBC: 4.66 MIL/uL (ref 3.80–5.10)
RDW: 14.1 % (ref 11.0–15.0)
WBC: 8.4 10*3/uL (ref 3.8–10.8)

## 2016-07-15 LAB — LIPID PANEL
Cholesterol: 221 mg/dL — ABNORMAL HIGH (ref 125–200)
HDL: 47 mg/dL (ref >=46)
LDL Cholesterol: 136 mg/dL — ABNORMAL HIGH (ref <130)
Total CHOL/HDL Ratio: 4.7 Ratio (ref <=5.0)
Triglycerides: 190 mg/dL — ABNORMAL HIGH (ref <150)
VLDL: 38 mg/dL — ABNORMAL HIGH (ref <30)

## 2016-07-15 MED ORDER — LISINOPRIL-HYDROCHLOROTHIAZIDE 10-12.5 MG PO TABS: 1.0000 | ORAL_TABLET | Freq: Every day | ORAL | 3 refills | Status: DC

## 2016-07-15 MED ORDER — RANITIDINE HCL 150 MG PO TABS: 150.0000 mg | ORAL_TABLET | Freq: Two times a day (BID) | ORAL | 0 refills | Status: DC

## 2016-07-15 MED ORDER — LEVOTHYROXINE SODIUM 100 MCG PO TABS: 100.0000 ug | ORAL_TABLET | Freq: Every day | ORAL | 3 refills | Status: DC

## 2016-07-15 MED ORDER — RANITIDINE HCL 300 MG PO CAPS: 300.0000 mg | ORAL_CAPSULE | Freq: Every evening | ORAL | 3 refills | Status: DC

## 2016-07-15 NOTE — Progress Notes (Signed)
Erika Cervantes is a 60 y.o. female who presents to [facility] today for HTN and medication refills  1.   Hypertension:  Long-term problem for this patient.  No adverse effects from medication.  Not checking it regularly.  No HA, CP, dizziness, shortness of breath, palpitations, or LE swelling.  Controlled on same medication for years.  BP Readings from Last 3 Encounters:  07/15/16 120/66  11/19/15 138/62  11/01/15 120/72   2.  Hypothyroidism:  Also diagnosed years ago.  Treated with Synthroid. Same dose for quite some time.  Denies any complications from this.  No heat/cold intolerance.  No LE edema.    3.  Pain of Right hand:  Present for about 3 weeks.  Has history of carpal tunnel in same hand treated with cock-up splint but this is different.  Worse when she is trying to grip or open jars.  Has felt "pop" and snap/release in hand when trying to bend her fingers.  She states that it feels "tighter" in her hand along the fourth metacarpal but she is trying to open or close her hand.  4.  GERD:  Present for years. She's been treated with omeprazole 20 mg also for years. States that she tries to go without taking this medication she has worsening reflux symptoms as well as brash. Has never seen a gastroenterologist. No nausea vomiting. Unsure which foods are triggering this. Denies eating any spicy foods. Not really much caffeine intake. No alcohol intake.  ROS as above.  Pertinently, no chest pain, palpitations, SOB, Fever, Chills, Abd pain, N/V/D.   PMH reviewed. Patient is a nonsmoker.   Past Medical History:  Diagnosis Date  . Allergy   . GERD (gastroesophageal reflux disease)   . Hypertension   . Thyroid disease    Past Surgical History:  Procedure Laterality Date  . ABDOMINAL HYSTERECTOMY     Fibroids  . BREAST SURGERY     Right breast mass  . COLON SURGERY    . Hammer toes    . TONSILLECTOMY      Medications reviewed. Current Outpatient Prescriptions    Medication Sig Dispense Refill  . albuterol (PROVENTIL HFA;VENTOLIN HFA) 108 (90 Base) MCG/ACT inhaler Inhale 2 puffs into the lungs every 6 (six) hours as needed for wheezing or shortness of breath. 1 Inhaler 0  . levothyroxine (SYNTHROID, LEVOTHROID) 100 MCG tablet Take 1 tablet (100 mcg total) by mouth daily before breakfast. 90 tablet 3  . lisinopril-hydrochlorothiazide (PRINZIDE,ZESTORETIC) 10-12.5 MG per tablet Take 1 tablet by mouth daily. 90 tablet 3  . omeprazole (PRILOSEC) 20 MG capsule TAKE 1 CAPSULE (20 MG TOTAL) BY MOUTH DAILY. 90 capsule 3  . diclofenac (VOLTAREN) 75 MG EC tablet TAKE 1 TABLET (75 MG TOTAL) BY MOUTH 2 (TWO) TIMES DAILY. (Patient not taking: Reported on 07/15/2016) 60 tablet 3   No current facility-administered medications for this visit.      Physical Exam:  BP 120/66 (BP Location: Right Arm, Patient Position: Sitting, Cuff Size: Large)   Pulse 89   Temp 98.3 F (36.8 C) (Oral)   Ht 5\' 5"  (1.651 m)   Wt 193 lb (87.5 kg)   SpO2 97%   BMI 32.12 kg/m  Gen:  Alert, cooperative patient who appears stated age in no acute distress.  Vital signs reviewed. HEENT: EOMI,  MMM Pulm:  Clear to auscultation bilaterally with good air movement.  No wheezes or rales noted.   Cardiac:  Regular rate and rhythm  without murmur auscultated.  Good S1/S2. Abd:  Soft/nondistended/nontender.  Good bowel sounds throughout all four quadrants.  No masses noted.  Exts: Non edematous BL  LE, warm and well perfused.  Psych:  Not depressed or anxious appearing.  Linear and coherent thought process as evidenced by speech pattern. Smiles spontaneously.  MSK: Left hand within normal limits. Right hand with tenderness along the fourth metacarpal which is mild in nature. She has difficulty fully closing her fist secondary to the pain. Hand grip strength is 4 out of 5 on that side limited by pain. She has no evidence of contracture at this time.  Assessment and Plan:  1.  HTN: - Controlled  today. -No change to medication regimen.  #2 hypothyroidism: -Refill for her Synthroid. -Asymptomatic. -Checking TSH today.  #3. GERD: -We will attempt to taper off her omeprazole she has been on this for years and this considerate for osteoporosis. She already has diagnosis of vitamin D deficiency. -Substitute with H2 blocker. -See AVS for further instructions on the taper.  4. Right finger pain/tightness. 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 -She is taking ibuprofen and Tylenol for pain relief but still feels the tightness. -She like referral to orthopedist for further evaluation today.

## 2016-07-15 NOTE — Patient Instructions (Addendum)
I have refilled your medications today.    We are checking your labs today as well.   Likely discuss trying to decrease the omeprazole is good for your long-term health. I will help reduce the risk of bone fractures in the future. To do this I would start taking this every other day. You can take the ranitidine as a substitute medicine on the days you do not take the omeprazole. Start the spread this out more frequently until you are able to go 2, 3, 4 days in between.  If you're having trouble this come back and see [place].  We will let you know the results of her labs.     We recommend that you schedule a mammogram for breast cancer screening. Typically, you do not need a referral to do this. Please contact a local imaging center to schedule your mammogram.  [facility] - [phone]  *ask for the Radiology Department [facility] - [phone] or [phone]  [facility] - [phone] [place] [phone] [facility] - [phone]  *ask for the [facility] - [phone]  *ask for the Radiology Department [facility] - [phone]  *ask for the [facility] - [phone]      IF you received an x-ray today, you will receive an invoice from [facility]. Please contact [facility] at [phone] with questions or concerns regarding your invoice.   IF you received labwork today, you will receive an invoice from [employer]. Please contact [facility] at [phone] with questions or concerns regarding your invoice.   Our billing staff will not be able to assist you with questions regarding bills from these companies.  You will be contacted with the lab results as soon as they are available. The fastest way to get your results is to activate your My Chart account. Instructions  are located on the last page of this paperwork. If you have not heard from [place] regarding the results in 2 weeks, please contact this office.

## 2016-10-06 ENCOUNTER — Other Ambulatory Visit: Payer: Self-pay | Admitting: Physician Assistant

## 2016-10-09 NOTE — Telephone Encounter (Signed)
Dr Mingo Amber, you saw pt for check up in Sept and this was on pt's med list, but don't see it discussed in notes. Do you want to Rx for pt?

## 2016-10-10 NOTE — Telephone Encounter (Signed)
We can send in 1 month's worth.  Based on her diagnoses of ongoing GERD and hypertension, she probably shouldn't be on this long-term as it can worsen both.

## 2017-04-06 ENCOUNTER — Encounter: Payer: Self-pay | Admitting: Physician Assistant

## 2017-04-06 ENCOUNTER — Ambulatory Visit (INDEPENDENT_AMBULATORY_CARE_PROVIDER_SITE_OTHER): Payer: BLUE CROSS/BLUE SHIELD | Admitting: Physician Assistant

## 2017-04-06 VITALS — BP 131/71 | HR 83 | Temp 98.6°F | Resp 17 | Ht 66.5 in | Wt 190.0 lb

## 2017-04-06 DIAGNOSIS — E039 Hypothyroidism, unspecified: Secondary | ICD-10-CM

## 2017-04-06 DIAGNOSIS — Z1239 Encounter for other screening for malignant neoplasm of breast: Secondary | ICD-10-CM

## 2017-04-06 DIAGNOSIS — Z1231 Encounter for screening mammogram for malignant neoplasm of breast: Secondary | ICD-10-CM | POA: Diagnosis not present

## 2017-04-06 DIAGNOSIS — I10 Essential (primary) hypertension: Secondary | ICD-10-CM

## 2017-04-06 MED ORDER — LEVOTHYROXINE SODIUM 100 MCG PO TABS: 100.0000 ug | ORAL_TABLET | Freq: Every day | ORAL | 1 refills | Status: DC

## 2017-04-06 MED ORDER — LISINOPRIL-HYDROCHLOROTHIAZIDE 10-12.5 MG PO TABS: 1.0000 | ORAL_TABLET | Freq: Every day | ORAL | 1 refills | Status: DC

## 2017-04-06 MED ORDER — RANITIDINE HCL 300 MG PO CAPS: 300.0000 mg | ORAL_CAPSULE | Freq: Every evening | ORAL | 3 refills | Status: DC

## 2017-04-06 NOTE — Progress Notes (Signed)
PRIMARY CARE AT [facility] [address], [place] [address] [phone]  Date:  04/06/2017   Name:  Erika Cervantes   DOB:  [birth year]   MRN:  [id number]  PCP:  Patient, No Pcp Per    History of Present Illness:  Erika Cervantes is a 61 y.o. female patient who presents to PCP with  Chief Complaint  Patient presents with  . Medication Refill    zantac,lisinopril, synthroid     1.   Hypertension:  Long-term problem for this patient.  No adverse effects from medication.  Not checking it regularly.  No HA, CP, dizziness, shortness of breath, palpitations, or LE swelling.  Controlled on same medication for years.  Working a lot, she will have leg cramps.   She does have some difficulty with fatigue now and then.  No missed dosing.  Watching sodium by not eating with extra salt.  Drinks sodas.    2.  Hypothyroidism:  Also diagnosed years ago.  Treated with Synthroid. Same dose for quite some time.  Denies any complications from this.  No heat/cold intolerance.  No LE edema.  she takes this alone.  She has noticed some    4.  GERD:  currently taking the ranitidine.  She takes it every 2-3 days later.  No fried foods, or spicy foods.  She eats tomatoes.    HM: Mammogram was not covered by insurance, she reports.  She states that her last one was 1 year ago.   ROS as above.  Pertinently, no chest pain, palpitations, SOB, Fever, Chills, Abd pain, N/V/D.    Patient Active Problem List   Diagnosis Date Noted  . Degenerative arthritis of thumb 09/04/2015  . Vitamin D deficiency 05/26/2012  . Hypothyroidism 02/26/2012  . Hyperlipidemia with target LDL less than 100 02/26/2012  . Allergic rhinitis due to allergen 02/26/2012  . HTN (hypertension) 02/26/2012  . Obesity (BMI 30.0-34.9) 02/26/2012  . Situational anxiety 02/26/2012  . Bulging lumbar disc 02/26/2012  . GERD (gastroesophageal reflux disease) 02/26/2012    Past Medical History:  Diagnosis Date  . Allergy   . GERD (gastroesophageal  reflux disease)   . Hypertension   . Thyroid disease     Past Surgical History:  Procedure Laterality Date  . ABDOMINAL HYSTERECTOMY     Fibroids  . BREAST SURGERY     Right breast mass  . COLON SURGERY    . Hammer toes    . TONSILLECTOMY      Social History  Substance Use Topics  . Smoking status: Former Smoker    Quit date: 08/22/1997  . Smokeless tobacco: Never Used  . Alcohol use No    Family History  Problem Relation Age of Onset  . Heart disease Mother     Allergies  Allergen Reactions  . Darvocet [Propoxyphene N-Acetaminophen]     vomit    Medication list has been reviewed and updated.  Current Outpatient Prescriptions on File Prior to Visit  Medication Sig Dispense Refill  . diclofenac (VOLTAREN) 75 MG EC tablet TAKE 1 TABLET (75 MG TOTAL) BY MOUTH 2 (TWO) TIMES DAILY. 60 tablet 0  . levothyroxine (SYNTHROID, LEVOTHROID) 100 MCG tablet Take 1 tablet (100 mcg total) by mouth daily before breakfast. 90 tablet 3  . lisinopril-hydrochlorothiazide (PRINZIDE,ZESTORETIC) 10-12.5 MG tablet Take 1 tablet by mouth daily. 90 tablet 3  . ranitidine (ZANTAC) 300 MG capsule Take 1 capsule (300 mg total) by mouth every evening. PLEASE DISREGARD SCRIPT FOR 150 MG BID  DOSING 30 capsule 3   No current facility-administered medications on file prior to visit.     ROS ROS otherwise unremarkable unless listed above.  Physical Examination: BP 131/71   Pulse 83   Temp 98.6 F (37 C) (Oral)   Resp 17   Ht 5' 6.5" (1.689 m)   Wt 190 lb (86.2 kg)   SpO2 98%   BMI 30.21 kg/m  Ideal Body Weight: Weight in (lb) to have BMI = 25: 156.9  Physical Exam  Constitutional: She is oriented to person, place, and time. She appears well-developed and well-nourished. No distress.  HENT:  Head: Normocephalic and atraumatic.  Right Ear: External ear normal.  Left Ear: External ear normal.  Eyes: Conjunctivae and EOM are normal. Pupils are equal, round, and reactive to light.   Cardiovascular: Normal rate, regular rhythm and intact distal pulses.  Exam reveals no friction rub.   No murmur heard. Pulmonary/Chest: Effort normal and breath sounds normal. No respiratory distress. She has no wheezes.  Musculoskeletal: Normal range of motion. She exhibits no edema.  Neurological: She is alert and oriented to person, place, and time.  Skin: Skin is warm and dry. She is not diaphoretic.  Psychiatric: She has a normal mood and affect. Her behavior is normal.     Assessment and Plan: Erika Cervantes is a 61 y.o. female who is here today for cc of medication refill and follow up of GERD, hypertension, and thyroid. Placing referrral for her mammogram.   bp controlled--rtc in 6 months Thyroid pending--however may change pending labs. Hypothyroidism, unspecified type - Plan: TSH  Essential hypertension - Plan: CMP14+EGFR, lisinopril-hydrochlorothiazide (PRINZIDE,ZESTORETIC) 10-12.5 MG tablet  Screening for malignant neoplasm of breast - Plan: MM DIGITAL SCREENING BILATERAL  Ivar Drape, PA-C [facility] 6/17/20187:14 AM

## 2017-04-07 LAB — CMP14+EGFR
ALT: 11 IU/L (ref 0–32)
AST: 15 IU/L (ref 0–40)
Albumin/Globulin Ratio: 1.2 (ref 1.2–2.2)
Albumin: 4.1 g/dL (ref 3.6–4.8)
Alkaline Phosphatase: 49 IU/L (ref 39–117)
BUN/Creatinine Ratio: 16 (ref 12–28)
BUN: 18 mg/dL (ref 8–27)
Bilirubin Total: 0.5 mg/dL (ref 0.0–1.2)
CO2: 23 mmol/L (ref 20–29)
Calcium: 9.9 mg/dL (ref 8.7–10.3)
Chloride: 106 mmol/L (ref 96–106)
Creatinine, Ser: 1.12 mg/dL — ABNORMAL HIGH (ref 0.57–1.00)
GFR calc Af Amer: 62 mL/min/{1.73_m2} (ref >59)
GFR calc non Af Amer: 54 mL/min/{1.73_m2} — ABNORMAL LOW (ref >59)
Globulin, Total: 3.4 g/dL (ref 1.5–4.5)
Glucose: 80 mg/dL (ref 65–99)
Potassium: 4.6 mmol/L (ref 3.5–5.2)
Sodium: 141 mmol/L (ref 134–144)
Total Protein: 7.5 g/dL (ref 6.0–8.5)

## 2017-04-07 LAB — TSH: TSH: 5.62 u[IU]/mL — ABNORMAL HIGH (ref 0.450–4.500)

## 2017-05-14 ENCOUNTER — Other Ambulatory Visit: Payer: Self-pay | Admitting: Physician Assistant

## 2017-05-14 DIAGNOSIS — R7989 Other specified abnormal findings of blood chemistry: Secondary | ICD-10-CM

## 2017-05-17 ENCOUNTER — Telehealth: Payer: Self-pay | Admitting: General Practice

## 2017-10-05 ENCOUNTER — Other Ambulatory Visit: Payer: Self-pay | Admitting: Physician Assistant

## 2017-10-05 DIAGNOSIS — I10 Essential (primary) hypertension: Secondary | ICD-10-CM

## 2017-10-08 ENCOUNTER — Other Ambulatory Visit: Payer: Self-pay | Admitting: Family Medicine

## 2017-10-08 ENCOUNTER — Telehealth: Payer: Self-pay | Admitting: Physician Assistant

## 2017-10-08 NOTE — Telephone Encounter (Signed)
Pt asking about med refill.  Request was sent 10-05-17.  Husband says she is completely out and needs it. [phone]

## 2017-12-07 ENCOUNTER — Ambulatory Visit (INDEPENDENT_AMBULATORY_CARE_PROVIDER_SITE_OTHER): Payer: No Typology Code available for payment source | Admitting: Urgent Care

## 2017-12-07 ENCOUNTER — Telehealth: Payer: Self-pay | Admitting: General Practice

## 2017-12-07 ENCOUNTER — Other Ambulatory Visit: Payer: Self-pay

## 2017-12-07 ENCOUNTER — Encounter: Payer: Self-pay | Admitting: Urgent Care

## 2017-12-07 VITALS — BP 124/62 | HR 84 | Temp 98.5°F | Resp 16 | Ht 66.5 in | Wt 191.0 lb

## 2017-12-07 DIAGNOSIS — J3489 Other specified disorders of nose and nasal sinuses: Secondary | ICD-10-CM | POA: Diagnosis not present

## 2017-12-07 DIAGNOSIS — R059 Cough, unspecified: Secondary | ICD-10-CM

## 2017-12-07 DIAGNOSIS — J209 Acute bronchitis, unspecified: Secondary | ICD-10-CM | POA: Diagnosis not present

## 2017-12-07 DIAGNOSIS — R05 Cough: Secondary | ICD-10-CM | POA: Diagnosis not present

## 2017-12-07 MED ORDER — ALBUTEROL SULFATE HFA 108 (90 BASE) MCG/ACT IN AERS: 2.0000 | INHALATION_SPRAY | Freq: Four times a day (QID) | RESPIRATORY_TRACT | 1 refills | Status: DC | PRN

## 2017-12-07 MED ORDER — BENZONATATE 100 MG PO CAPS
100.0000 mg | ORAL_CAPSULE | Freq: Three times a day (TID) | ORAL | 0 refills | Status: DC | PRN
Start: 2017-12-07 — End: 2018-01-11

## 2017-12-07 MED ORDER — PSEUDOEPHEDRINE HCL ER 120 MG PO TB12: 120.0000 mg | ORAL_TABLET | Freq: Two times a day (BID) | ORAL | 3 refills | Status: DC

## 2017-12-07 MED ORDER — HYDROCODONE-HOMATROPINE 5-1.5 MG/5ML PO SYRP: 5.0000 mL | ORAL_SOLUTION | Freq: Every evening | ORAL | 0 refills | Status: DC | PRN

## 2017-12-07 MED ORDER — DOXYCYCLINE HYCLATE 100 MG PO CAPS: 100.0000 mg | ORAL_CAPSULE | Freq: Two times a day (BID) | ORAL | 0 refills | Status: DC

## 2017-12-07 NOTE — Addendum Note (Signed)
Addended by: Jaynee Eagles on: 12/07/2017 03:02 PM   Modules accepted: Orders

## 2017-12-07 NOTE — Progress Notes (Signed)
  MRN: [id number] DOB: 01-08-1956  Subjective:   Erika Cervantes is a 62 y.o. female presenting for 4 week history of sinus congestion that progressed to chest congestion, productive cough that elicits nausea, post-tussive emesis, shob. Has started having fever, sinus pain, ear fullness. Denies smoking cigarettes. Has a history of bronchospasms with bronchitis, uses inhaler for these episodes. Denies family history of lung disease or sarcoidosis. Has used APAP-cold with minimal relief. Does not have allergies to APAP.  Review of Systems  Respiratory: Negative for hemoptysis.   Cardiovascular: Negative for chest pain.  Gastrointestinal: Negative for abdominal pain.  Skin: Negative for rash.   Erika Cervantes has a current medication list which includes the following prescription(s): diclofenac, levothyroxine, lisinopril-hydrochlorothiazide, and ranitidine. Also is allergic to darvocet [propoxyphene n-acetaminophen].  Erika Cervantes  has a past medical history of Allergy, GERD (gastroesophageal reflux disease), Hypertension, and Thyroid disease. Also  has a past surgical history that includes Colon surgery; Tonsillectomy; Abdominal hysterectomy; Breast surgery; and Hammer toes. Her family history includes Heart disease in her mother.   Objective:   Vitals: BP 124/62   Pulse 84   Temp 98.5 F (36.9 C) (Oral)   Resp 16   Ht 5' 6.5" (1.689 m)   Wt 191 lb (86.6 kg)   SpO2 96%   BMI 30.37 kg/m   Physical Exam  Constitutional: She is oriented to person, place, and time. She appears well-developed and well-nourished.  HENT:  TM's intact bilaterally, no effusions or erythema. Nasal turbinates dry, nasal passages minimally patent. No sinus tenderness. Oropharynx with significant post-nasal drainage, mucous membranes moist.    Eyes: Right eye exhibits no discharge. Left eye exhibits no discharge.  Neck: Normal range of motion. Neck supple.  Left anterior lymph node pain.  Cardiovascular: Normal rate, regular rhythm and  intact distal pulses. Exam reveals no gallop and no friction rub.  No murmur heard. Pulmonary/Chest: No respiratory distress. She has no wheezes. She has no rales.  Rhonchi auscultated throughout.  Neurological: She is alert and oriented to person, place, and time.  Skin: Skin is warm and dry.  Psychiatric: She has a normal mood and affect.   Assessment and Plan :   Acute bronchitis, unspecified organism  Cough  Sinus pain  Will use doxycycline for bronchitis which may also cover for concurrent sinusitis. Use cough suppression medications, supportive care otherwise. Return-to-clinic precautions discussed, patient verbalized understanding.   Jaynee Eagles, PA-C Primary Care at [facility] [phone] 12/07/2017  1:32 PM

## 2017-12-07 NOTE — Patient Instructions (Addendum)
Hydrate well with at least 2 liters (1 gallon) of water daily. For sore throat try using a honey-based tea. Use 3 teaspoons of honey with juice squeezed from half lemon. Place shaved pieces of ginger into 1/2-1 cup of water and warm over stove top. Then mix the ingredients and repeat every 4 hours as needed.    Acute Bronchitis, Adult Acute bronchitis is sudden (acute) swelling of the air tubes (bronchi) in the lungs. Acute bronchitis causes these tubes to fill with mucus, which can make it hard to breathe. It can also cause coughing or wheezing. In adults, acute bronchitis usually goes away within 2 weeks. A cough caused by bronchitis may last up to 3 weeks. Smoking, allergies, and asthma can make the condition worse. Repeated episodes of bronchitis may cause further lung problems, such as chronic obstructive pulmonary disease (COPD). What are the causes? This condition can be caused by germs and by substances that irritate the lungs, including:  Cold and flu viruses. This condition is most often caused by the same virus that causes a cold.  Bacteria.  Exposure to tobacco smoke, dust, fumes, and air pollution.  What increases the risk? This condition is more likely to develop in people who:  Have close contact with someone with acute bronchitis.  Are exposed to lung irritants, such as tobacco smoke, dust, fumes, and vapors.  Have a weak immune system.  Have a respiratory condition such as asthma.  What are the signs or symptoms? Symptoms of this condition include:  A cough.  Coughing up clear, yellow, or green mucus.  Wheezing.  Chest congestion.  Shortness of breath.  A fever.  Body aches.  Chills.  A sore throat.  How is this diagnosed? This condition is usually diagnosed with a physical exam. During the exam, your health care provider may order tests, such as chest X-rays, to rule out other conditions. He or she may also:  Test a sample of your mucus for  bacterial infection.  Check the level of oxygen in your blood. This is done to check for pneumonia.  Do a chest X-ray or lung function testing to rule out pneumonia and other conditions.  Perform blood tests.  Your health care provider will also ask about your symptoms and medical history. How is this treated? Most cases of acute bronchitis clear up over time without treatment. Your health care provider may recommend:  Drinking more fluids. Drinking more makes your mucus thinner, which may make it easier to breathe.  Taking a medicine for a fever or cough.  Taking an antibiotic medicine.  Using an inhaler to help improve shortness of breath and to control a cough.  Using a cool mist vaporizer or humidifier to make it easier to breathe.  Follow these instructions at home: Medicines  Take over-the-counter and prescription medicines only as told by your health care provider.  If you were prescribed an antibiotic, take it as told by your health care provider. Do not stop taking the antibiotic even if you start to feel better. General instructions  Get plenty of rest.  Drink enough fluids to keep your urine clear or pale yellow.  Avoid smoking and secondhand smoke. Exposure to cigarette smoke or irritating chemicals will make bronchitis worse. If you smoke and you need help quitting, ask your health care provider. Quitting smoking will help your lungs heal faster.  Use an inhaler, cool mist vaporizer, or humidifier as told by your health care provider.  Keep all follow-up visits  as told by your health care provider. This is important. How is this prevented? To lower your risk of getting this condition again:  Wash your hands often with soap and water. If soap and water are not available, use hand sanitizer.  Avoid contact with people who have cold symptoms.  Try not to touch your hands to your mouth, nose, or eyes.  Make sure to get the flu shot every year.  Contact a  health care provider if:  Your symptoms do not improve in 2 weeks of treatment. Get help right away if:  You cough up blood.  You have chest pain.  You have severe shortness of breath.  You become dehydrated.  You faint or keep feeling like you are going to faint.  You keep vomiting.  You have a severe headache.  Your fever or chills gets worse. This information is not intended to replace advice given to you by your health care provider. Make sure you discuss any questions you have with your health care provider. Document Released: 11/19/2004 Document Revised: 05/06/2016 Document Reviewed: 04/01/2016 Elsevier Interactive Patient Education  [employer].

## 2017-12-07 NOTE — Telephone Encounter (Signed)
Copied from [place] [id number]. Topic: Quick Communication - See Telephone Encounter >> Dec 07, 2017  2:26 PM Conception Chancy, NT wrote: CRM for notification. See Telephone encounter for:  12/07/17.  Pt states she just left the office and all the scripts were called in except the Albuterol. She is at the pharmacy now. Please advise.

## 2017-12-07 NOTE — Telephone Encounter (Signed)
I called pt mobile number which is her husbands cell and notified him that the inhaler has been sent

## 2017-12-14 ENCOUNTER — Ambulatory Visit: Payer: No Typology Code available for payment source | Admitting: Urgent Care

## 2017-12-21 ENCOUNTER — Encounter: Payer: Self-pay | Admitting: Urgent Care

## 2017-12-21 ENCOUNTER — Ambulatory Visit (INDEPENDENT_AMBULATORY_CARE_PROVIDER_SITE_OTHER): Payer: No Typology Code available for payment source | Admitting: Urgent Care

## 2017-12-21 VITALS — BP 126/64 | HR 86 | Temp 98.1°F | Resp 18 | Ht 66.0 in | Wt 188.4 lb

## 2017-12-21 DIAGNOSIS — R05 Cough: Secondary | ICD-10-CM

## 2017-12-21 DIAGNOSIS — R059 Cough, unspecified: Secondary | ICD-10-CM

## 2017-12-21 DIAGNOSIS — Z1239 Encounter for other screening for malignant neoplasm of breast: Secondary | ICD-10-CM

## 2017-12-21 DIAGNOSIS — Z1231 Encounter for screening mammogram for malignant neoplasm of breast: Secondary | ICD-10-CM

## 2017-12-21 DIAGNOSIS — J209 Acute bronchitis, unspecified: Secondary | ICD-10-CM

## 2017-12-21 NOTE — Progress Notes (Signed)
    MRN: [id number] DOB: 1955/11/11  Subjective:   Erika Cervantes is a 62 y.o. female presenting for follow up on acute bronchitis. Patient notes significant improvement. Has mild cough still. Denies fever, sinus pain, throat pain, chest pain, shob, wheezing. She was unable to tolerate cough syrup but is using Tessalon capsules very well.   Erika Cervantes has a current medication list which includes the following prescription(s): albuterol, benzonatate, diclofenac, levothyroxine, lisinopril-hydrochlorothiazide, pseudoephedrine, and ranitidine. Also is allergic to darvocet [propoxyphene n-acetaminophen].  Erika Cervantes  has a past medical history of Allergy, GERD (gastroesophageal reflux disease), Hypertension, and Thyroid disease. Also  has a past surgical history that includes Colon surgery; Tonsillectomy; Abdominal hysterectomy; Breast surgery; and Hammer toes.  Objective:   Vitals: BP 126/64   Pulse 86   Temp 98.1 F (36.7 C) (Oral)   Resp 18   Ht 5\' 6"  (1.676 m)   Wt 188 lb 6.4 oz (85.5 kg)   SpO2 98%   BMI 30.41 kg/m   Physical Exam  Constitutional: She is oriented to person, place, and time. She appears well-developed and well-nourished.  HENT:  Mouth/Throat: Oropharynx is clear and moist.  No sinus pain.  Cardiovascular: Normal rate, regular rhythm and intact distal pulses. Exam reveals no gallop and no friction rub.  No murmur heard. Pulmonary/Chest: No respiratory distress. She has no wheezes. She has no rales.  Neurological: She is alert and oriented to person, place, and time.  Skin: Skin is warm and dry.  Psychiatric: She has a normal mood and affect.   Assessment and Plan :   Acute bronchitis, unspecified organism  Cough  Screening for breast cancer - Plan: MM Digital Screening  Improved, finish course of doxycycline. She does not have a PCP, has not had pap smear, mammogram in a long time. Had her colonoscopy ~2 years ago. Patient will try to bring documentation to her next visit.  She will set up annual physical in 1 week.  Jaynee Eagles, PA-C [facility] [phone] 12/21/2017 1:49 PM

## 2017-12-21 NOTE — Patient Instructions (Addendum)
Acute Bronchitis, Adult Acute bronchitis is sudden (acute) swelling of the air tubes (bronchi) in the lungs. Acute bronchitis causes these tubes to fill with mucus, which can make it hard to breathe. It can also cause coughing or wheezing. In adults, acute bronchitis usually goes away within 2 weeks. A cough caused by bronchitis may last up to 3 weeks. Smoking, allergies, and asthma can make the condition worse. Repeated episodes of bronchitis may cause further lung problems, such as chronic obstructive pulmonary disease (COPD). What are the causes? This condition can be caused by germs and by substances that irritate the lungs, including:  Cold and flu viruses. This condition is most often caused by the same virus that causes a cold.  Bacteria.  Exposure to tobacco smoke, dust, fumes, and air pollution.  What increases the risk? This condition is more likely to develop in people who:  Have close contact with someone with acute bronchitis.  Are exposed to lung irritants, such as tobacco smoke, dust, fumes, and vapors.  Have a weak immune system.  Have a respiratory condition such as asthma.  What are the signs or symptoms? Symptoms of this condition include:  A cough.  Coughing up clear, yellow, or green mucus.  Wheezing.  Chest congestion.  Shortness of breath.  A fever.  Body aches.  Chills.  A sore throat.  How is this diagnosed? This condition is usually diagnosed with a physical exam. During the exam, your health care provider may order tests, such as chest X-rays, to rule out other conditions. He or she may also:  Test a sample of your mucus for bacterial infection.  Check the level of oxygen in your blood. This is done to check for pneumonia.  Do a chest X-ray or lung function testing to rule out pneumonia and other conditions.  Perform blood tests.  Your health care provider will also ask about your symptoms and medical history. How is this  treated? Most cases of acute bronchitis clear up over time without treatment. Your health care provider may recommend:  Drinking more fluids. Drinking more makes your mucus thinner, which may make it easier to breathe.  Taking a medicine for a fever or cough.  Taking an antibiotic medicine.  Using an inhaler to help improve shortness of breath and to control a cough.  Using a cool mist vaporizer or humidifier to make it easier to breathe.  Follow these instructions at home: Medicines  Take over-the-counter and prescription medicines only as told by your health care provider.  If you were prescribed an antibiotic, take it as told by your health care provider. Do not stop taking the antibiotic even if you start to feel better. General instructions  Get plenty of rest.  Drink enough fluids to keep your urine clear or pale yellow.  Avoid smoking and secondhand smoke. Exposure to cigarette smoke or irritating chemicals will make bronchitis worse. If you smoke and you need help quitting, ask your health care provider. Quitting smoking will help your lungs heal faster.  Use an inhaler, cool mist vaporizer, or humidifier as told by your health care provider.  Keep all follow-up visits as told by your health care provider. This is important. How is this prevented? To lower your risk of getting this condition again:  Wash your hands often with soap and water. If soap and water are not available, use hand sanitizer.  Avoid contact with people who have cold symptoms.  Try not to touch your hands to your   mouth, nose, or eyes.  Make sure to get the flu shot every year.  Contact a health care provider if:  Your symptoms do not improve in 2 weeks of treatment. Get help right away if:  You cough up blood.  You have chest pain.  You have severe shortness of breath.  You become dehydrated.  You faint or keep feeling like you are going to faint.  You keep vomiting.  You have a  severe headache.  Your fever or chills gets worse. This information is not intended to replace advice given to you by your health care provider. Make sure you discuss any questions you have with your health care provider. Document Released: 11/19/2004 Document Revised: 05/06/2016 Document Reviewed: 04/01/2016 Elsevier Interactive Patient Education  2018 [employer].     IF you received an x-ray today, you will receive an invoice from [facility]. Please contact [facility] at [phone] with questions or concerns regarding your invoice.   IF you received labwork today, you will receive an invoice from [facility]. Please contact [facility] at [phone] with questions or concerns regarding your invoice.   Our billing staff will not be able to assist you with questions regarding bills from these companies.  You will be contacted with the lab results as soon as they are available. The fastest way to get your results is to activate your My Chart account. Instructions are located on the last page of this paperwork. If you have not heard from [place] regarding the results in 2 weeks, please contact this office.

## 2017-12-28 ENCOUNTER — Encounter: Payer: No Typology Code available for payment source | Admitting: Urgent Care

## 2018-01-08 ENCOUNTER — Other Ambulatory Visit: Payer: Self-pay | Admitting: Physician Assistant

## 2018-01-08 DIAGNOSIS — I10 Essential (primary) hypertension: Secondary | ICD-10-CM

## 2018-01-11 ENCOUNTER — Ambulatory Visit (INDEPENDENT_AMBULATORY_CARE_PROVIDER_SITE_OTHER): Payer: No Typology Code available for payment source | Admitting: Urgent Care

## 2018-01-11 ENCOUNTER — Encounter: Payer: Self-pay | Admitting: Urgent Care

## 2018-01-11 VITALS — BP 146/78 | HR 71 | Temp 98.7°F | Resp 18 | Ht 66.0 in | Wt 188.8 lb

## 2018-01-11 DIAGNOSIS — E038 Other specified hypothyroidism: Secondary | ICD-10-CM | POA: Diagnosis not present

## 2018-01-11 DIAGNOSIS — I10 Essential (primary) hypertension: Secondary | ICD-10-CM | POA: Diagnosis not present

## 2018-01-11 DIAGNOSIS — K219 Gastro-esophageal reflux disease without esophagitis: Secondary | ICD-10-CM

## 2018-01-11 MED ORDER — OMEPRAZOLE 20 MG PO CPDR: 20.0000 mg | DELAYED_RELEASE_CAPSULE | Freq: Every day | ORAL | 3 refills | Status: DC

## 2018-01-11 MED ORDER — LISINOPRIL-HYDROCHLOROTHIAZIDE 10-12.5 MG PO TABS: 1.0000 | ORAL_TABLET | Freq: Every day | ORAL | 0 refills | Status: DC

## 2018-01-11 MED ORDER — RANITIDINE HCL 300 MG PO CAPS: 300.0000 mg | ORAL_CAPSULE | Freq: Every evening | ORAL | 3 refills | Status: DC

## 2018-01-11 NOTE — Patient Instructions (Addendum)
Hypertension Hypertension is another name for high blood pressure. High blood pressure forces your heart to work harder to pump blood. This can cause problems over time. There are two numbers in a blood pressure reading. There is a top number (systolic) over a bottom number (diastolic). It is best to have a blood pressure below 120/80. Healthy choices can help lower your blood pressure. You may need medicine to help lower your blood pressure if:  Your blood pressure cannot be lowered with healthy choices.  Your blood pressure is higher than 130/80.  Follow these instructions at home: Eating and drinking  If directed, follow the DASH eating plan. This diet includes: ? Filling half of your plate at each meal with fruits and vegetables. ? Filling one quarter of your plate at each meal with whole grains. Whole grains include whole wheat pasta, brown rice, and whole grain bread. ? Eating or drinking low-fat dairy products, such as skim milk or low-fat yogurt. ? Filling one quarter of your plate at each meal with low-fat (lean) proteins. Low-fat proteins include fish, skinless chicken, eggs, beans, and tofu. ? Avoiding fatty meat, cured and processed meat, or chicken with skin. ? Avoiding premade or processed food.  Eat less than 1,500 mg of salt (sodium) a day.  Limit alcohol use to no more than 1 drink a day for nonpregnant women and 2 drinks a day for men. One drink equals 12 oz of beer, 5 oz of wine, or 1 oz of hard liquor. Lifestyle  Work with your doctor to stay at a healthy weight or to lose weight. Ask your doctor what the best weight is for you.  Get at least 30 minutes of exercise that causes your heart to beat faster (aerobic exercise) most days of the week. This may include walking, swimming, or biking.  Get at least 30 minutes of exercise that strengthens your muscles (resistance exercise) at least 3 days a week. This may include lifting weights or pilates.  Do not use any  products that contain nicotine or tobacco. This includes cigarettes and e-cigarettes. If you need help quitting, ask your doctor.  Check your blood pressure at home as told by your doctor.  Keep all follow-up visits as told by your doctor. This is important. Medicines  Take over-the-counter and prescription medicines only as told by your doctor. Follow directions carefully.  Do not skip doses of blood pressure medicine. The medicine does not work as well if you skip doses. Skipping doses also puts you at risk for problems.  Ask your doctor about side effects or reactions to medicines that you should watch for. Contact a doctor if:  You think you are having a reaction to the medicine you are taking.  You have headaches that keep coming back (recurring).  You feel dizzy.  You have swelling in your ankles.  You have trouble with your vision. Get help right away if:  You get a very bad headache.  You start to feel confused.  You feel weak or numb.  You feel faint.  You get very bad pain in your: ? Chest. ? Belly (abdomen).  You throw up (vomit) more than once.  You have trouble breathing. Summary  Hypertension is another name for high blood pressure.  Making healthy choices can help lower blood pressure. If your blood pressure cannot be controlled with healthy choices, you may need to take medicine. This information is not intended to replace advice given to you by your health care   provider. Make sure you discuss any questions you have with your health care provider. Document Released: 03/30/2008 Document Revised: 09/09/2016 Document Reviewed: 09/09/2016 Elsevier Interactive Patient Education  2018 [employer].      Hypothyroidism Hypothyroidism is a disorder of the thyroid. The thyroid is a large gland that is located in the lower front of the neck. The thyroid releases hormones that control how the body works. With hypothyroidism, the thyroid does not make enough  of these hormones. What are the causes? Causes of hypothyroidism may include:  Viral infections.  Pregnancy.  Your own defense system (immune system) attacking your thyroid.  Certain medicines.  Birth defects.  Past radiation treatments to your head or neck.  Past treatment with radioactive iodine.  Past surgical removal of part or all of your thyroid.  Problems with the gland that is located in the center of your brain (pituitary).  What are the signs or symptoms? Signs and symptoms of hypothyroidism may include:  Feeling as though you have no energy (lethargy).  Inability to tolerate cold.  Weight gain that is not explained by a change in diet or exercise habits.  Dry skin.  Coarse hair.  Menstrual irregularity.  Slowing of thought processes.  Constipation.  Sadness or depression.  How is this diagnosed? Your health care provider may diagnose hypothyroidism with blood tests and ultrasound tests. How is this treated? Hypothyroidism is treated with medicine that replaces the hormones that your body does not make. After you begin treatment, it may take several weeks for symptoms to go away. Follow these instructions at home:  Take medicines only as directed by your health care provider.  If you start taking any new medicines, tell your health care provider.  Keep all follow-up visits as directed by your health care provider. This is important. As your condition improves, your dosage needs may change. You will need to have blood tests regularly so that your health care provider can watch your condition. Contact a health care provider if:  Your symptoms do not get better with treatment.  You are taking thyroid replacement medicine and: ? You sweat excessively. ? You have tremors. ? You feel anxious. ? You lose weight rapidly. ? You cannot tolerate heat. ? You have emotional swings. ? You have diarrhea. ? You feel weak. Get help right away if:  You  develop chest pain.  You develop an irregular heartbeat.  You develop a rapid heartbeat. This information is not intended to replace advice given to you by your health care provider. Make sure you discuss any questions you have with your health care provider. Document Released: 10/12/2005 Document Revised: 03/19/2016 Document Reviewed: 02/27/2014 Elsevier Interactive Patient Education  2018 [place].      Gastroesophageal Reflux Disease, Adult Normally, food travels down the esophagus and stays in the stomach to be digested. If a person has gastroesophageal reflux disease (GERD), food and stomach acid move back up into the esophagus. When this happens, the esophagus becomes sore and swollen (inflamed). Over time, GERD can make small holes (ulcers) in the lining of the esophagus. Follow these instructions at home: Diet  Follow a diet as told by your doctor. You may need to avoid foods and drinks such as: ? Coffee and tea (with or without caffeine). ? Drinks that contain alcohol. ? Energy drinks and sports drinks. ? Carbonated drinks or sodas. ? Chocolate and cocoa. ? Peppermint and mint flavorings. ? Garlic and onions. ? Horseradish. ? Spicy and acidic foods, such  as peppers, chili powder, curry powder, vinegar, hot sauces, and BBQ sauce. ? Citrus fruit juices and citrus fruits, such as oranges, lemons, and limes. ? Tomato-based foods, such as red sauce, chili, salsa, and pizza with red sauce. ? Fried and fatty foods, such as donuts, french fries, potato chips, and high-fat dressings. ? High-fat meats, such as hot dogs, rib eye steak, sausage, ham, and bacon. ? High-fat dairy items, such as whole milk, butter, and cream cheese.  Eat small meals often. Avoid eating large meals.  Avoid drinking large amounts of liquid with your meals.  Avoid eating meals during the 2-3 hours before bedtime.  Avoid lying down right after you eat.  Do not exercise right after you  eat. General instructions  Pay attention to any changes in your symptoms.  Take over-the-counter and prescription medicines only as told by your doctor. Do not take aspirin, ibuprofen, or other NSAIDs unless your doctor says it is okay.  Do not use any tobacco products, including cigarettes, chewing tobacco, and e-cigarettes. If you need help quitting, ask your doctor.  Wear loose clothes. Do not wear anything tight around your waist.  Raise (elevate) the head of your bed about 6 inches (15 cm).  Try to lower your stress. If you need help doing this, ask your doctor.  If you are overweight, lose an amount of weight that is healthy for you. Ask your doctor about a safe weight loss goal.  Keep all follow-up visits as told by your doctor. This is important. Contact a doctor if:  You have new symptoms.  You lose weight and you do not know why it is happening.  You have trouble swallowing, or it hurts to swallow.  You have wheezing or a cough that keeps happening.  Your symptoms do not get better with treatment.  You have a hoarse voice. Get help right away if:  You have pain in your arms, neck, jaw, teeth, or back.  You feel sweaty, dizzy, or light-headed.  You have chest pain or shortness of breath.  You throw up (vomit) and your throw up looks like blood or coffee grounds.  You pass out (faint).  Your poop (stool) is bloody or black.  You cannot swallow, drink, or eat. This information is not intended to replace advice given to you by your health care provider. Make sure you discuss any questions you have with your health care provider. Document Released: 03/30/2008 Document Revised: 03/19/2016 Document Reviewed: 02/06/2015 Elsevier Interactive Patient Education  [employer].

## 2018-01-11 NOTE — Progress Notes (Signed)
   MRN: [id number] DOB: 07/19/1956  Subjective:   Erika Cervantes is a 62 y.o. female presenting for medication refills.  HTN - Managed with lis-HCTZ. Denies dizziness, chronic headache, chest pain, shortness of breath, heart racing, palpitations, nausea, vomiting, abdominal pain, hematuria, lower leg swelling. Denies smoking cigarettes or drinking alcohol.  Hypothyroidism - Managed with levothyroxine. Denies constipation, depression, hair thinning.   GERD - Prefers to switch to Prilosec instead. Zantac is not helping as much.  Health Maintenance - Patient cannot have colonoscopy or mammogram yet due to insurance coverage.  Erika Cervantes has a current medication list which includes the following prescription(s): levothyroxine, lisinopril-hydrochlorothiazide, ranitidine, and albuterol. Also is allergic to darvocet [propoxyphene n-acetaminophen].  Erika Cervantes  has a past medical history of Allergy, GERD (gastroesophageal reflux disease), Hypertension, and Thyroid disease. Also  has a past surgical history that includes Colon surgery; Tonsillectomy; Abdominal hysterectomy; Breast surgery; and Hammer toes.  Objective:   Vitals: BP (!) 146/78   Pulse 71   Temp 98.7 F (37.1 C) (Oral)   Resp 18   Ht 5\' 6"  (1.676 m)   Wt 188 lb 12.8 oz (85.6 kg)   SpO2 100%   BMI 30.47 kg/m   BP Readings from Last 3 Encounters:  01/11/18 (!) 146/78  12/21/17 126/64  12/07/17 124/62   Physical Exam  Constitutional: She is oriented to person, place, and time. She appears well-developed and well-nourished.  HENT:  Mouth/Throat: Oropharynx is clear and moist.  Eyes: Right eye exhibits no discharge. Left eye exhibits no discharge.  Neck: Normal range of motion. Neck supple. No thyromegaly present.  Cardiovascular: Normal rate, regular rhythm and intact distal pulses. Exam reveals no gallop and no friction rub.  No murmur heard. Pulmonary/Chest: No respiratory distress. She has no wheezes. She has no rales.    Musculoskeletal: She exhibits no edema.  Neurological: She is alert and oriented to person, place, and time.  Skin: Skin is warm and dry.  Psychiatric: She has a normal mood and affect.   Assessment and Plan :   Essential hypertension - Plan: lisinopril-hydrochlorothiazide (PRINZIDE,ZESTORETIC) 10-12.5 MG tablet, Comprehensive metabolic panel  Other specified hypothyroidism - Plan: Thyroid Panel With TSH  Gastroesophageal reflux disease without esophagitis  Patient has been out of her medications for 3 days. Refilled lis-HCTZ. Restart Prilosec. Labs pending, will refill thyroid medication as appropriate. Recheck in 6 months.  Erika Eagles, PA-C [facility] [phone] 01/11/2018  8:30 AM

## 2018-01-12 LAB — THYROID PANEL WITH TSH
Free Thyroxine Index: 1.8 (ref 1.2–4.9)
T3 Uptake Ratio: 30 % (ref 24–39)
T4, Total: 6 ug/dL (ref 4.5–12.0)
TSH: 7.03 u[IU]/mL — ABNORMAL HIGH (ref 0.450–4.500)

## 2018-01-12 LAB — COMPREHENSIVE METABOLIC PANEL
ALT: 10 IU/L (ref 0–32)
AST: 14 IU/L (ref 0–40)
Albumin/Globulin Ratio: 1.1 — ABNORMAL LOW (ref 1.2–2.2)
Albumin: 4.1 g/dL (ref 3.6–4.8)
Alkaline Phosphatase: 61 IU/L (ref 39–117)
BUN/Creatinine Ratio: 13 (ref 12–28)
BUN: 15 mg/dL (ref 8–27)
Bilirubin Total: 0.2 mg/dL (ref 0.0–1.2)
CO2: 22 mmol/L (ref 20–29)
Calcium: 9.6 mg/dL (ref 8.7–10.3)
Chloride: 103 mmol/L (ref 96–106)
Creatinine, Ser: 1.17 mg/dL — ABNORMAL HIGH (ref 0.57–1.00)
GFR calc Af Amer: 58 mL/min/{1.73_m2} — ABNORMAL LOW (ref >59)
GFR calc non Af Amer: 50 mL/min/{1.73_m2} — ABNORMAL LOW (ref >59)
Globulin, Total: 3.6 g/dL (ref 1.5–4.5)
Glucose: 95 mg/dL (ref 65–99)
Potassium: 4.2 mmol/L (ref 3.5–5.2)
Sodium: 142 mmol/L (ref 134–144)
Total Protein: 7.7 g/dL (ref 6.0–8.5)

## 2018-01-15 ENCOUNTER — Other Ambulatory Visit: Payer: Self-pay | Admitting: Urgent Care

## 2018-01-15 DIAGNOSIS — E038 Other specified hypothyroidism: Secondary | ICD-10-CM

## 2018-01-15 MED ORDER — LEVOTHYROXINE SODIUM 100 MCG PO TABS: 100.0000 ug | ORAL_TABLET | Freq: Every day | ORAL | 1 refills | Status: DC

## 2018-01-25 ENCOUNTER — Encounter: Payer: Self-pay | Admitting: Physician Assistant

## 2018-02-10 ENCOUNTER — Telehealth: Payer: Self-pay

## 2018-02-10 NOTE — Telephone Encounter (Signed)
Called no answer. CRM entered. What questions does pt have?  Copied from [place] [phone]. Topic: General - Other >> Feb 10, 2018  8:37 AM Erika Cervantes wrote: Reason for CRM:   Patient needs to talk to the provider's CMA about her prescriptions

## 2018-02-10 NOTE — Telephone Encounter (Addendum)
Provider wrote prescriptions for patient's thyroid and blood pressure for one month and then to come in for a re-check.  Patient can't keep going to the provider and specialists because she can't afford it, but she need the prescriptions refilled. Patient stated her BP is high because of the stress of not being able to afford the doctor's visits because of not having insurance.  She stated she will have insurance in June, but until then she needs her medication. Patient took her last thyroid tablet today.  Please call [phone]

## 2018-02-11 ENCOUNTER — Other Ambulatory Visit: Payer: Self-pay

## 2018-02-11 DIAGNOSIS — I10 Essential (primary) hypertension: Secondary | ICD-10-CM

## 2018-02-11 MED ORDER — LISINOPRIL-HYDROCHLOROTHIAZIDE 10-12.5 MG PO TABS: 1.0000 | ORAL_TABLET | Freq: Every day | ORAL | 4 refills | Status: DC

## 2018-02-11 NOTE — Telephone Encounter (Signed)
LMOVM for pt.  Thyroid meds orderd #90 with 1 refill to last until pt scheduled return in 44mo Refilled Lisinopril to last until return visit due in 51mo

## 2018-03-29 ENCOUNTER — Encounter: Payer: Self-pay | Admitting: Endocrinology

## 2018-05-11 ENCOUNTER — Other Ambulatory Visit: Payer: Self-pay | Admitting: Urgent Care

## 2018-07-22 ENCOUNTER — Other Ambulatory Visit: Payer: Self-pay | Admitting: Urgent Care

## 2018-07-22 DIAGNOSIS — I10 Essential (primary) hypertension: Secondary | ICD-10-CM

## 2018-07-22 NOTE — Telephone Encounter (Signed)
Linsinopril-10-112.5 refill Last Refill:02/11/18 # 30 Last OV: 01/11/18 PCPDr. Bess Harvest Pharmacy:[facility] [phone]

## 2018-08-13 ENCOUNTER — Other Ambulatory Visit: Payer: Self-pay | Admitting: Urgent Care

## 2018-09-07 ENCOUNTER — Other Ambulatory Visit: Payer: Self-pay | Admitting: Family Medicine

## 2018-09-07 NOTE — Telephone Encounter (Signed)
Left message for pt. To call and make an appointment for follow up. Needs to do this so refill can be sent in.

## 2018-09-07 NOTE — Telephone Encounter (Signed)
See request. Thanks. 

## 2018-10-24 ENCOUNTER — Ambulatory Visit: Payer: Self-pay | Admitting: Emergency Medicine

## 2018-10-24 ENCOUNTER — Encounter: Payer: Self-pay | Admitting: Emergency Medicine

## 2018-10-24 ENCOUNTER — Other Ambulatory Visit: Payer: Self-pay

## 2018-10-24 ENCOUNTER — Encounter

## 2018-10-24 VITALS — BP 157/74 | HR 79 | Temp 98.4°F | Resp 18 | Ht 67.0 in | Wt 201.8 lb

## 2018-10-24 DIAGNOSIS — Z1211 Encounter for screening for malignant neoplasm of colon: Secondary | ICD-10-CM

## 2018-10-24 DIAGNOSIS — I10 Essential (primary) hypertension: Secondary | ICD-10-CM

## 2018-10-24 DIAGNOSIS — E039 Hypothyroidism, unspecified: Secondary | ICD-10-CM

## 2018-10-24 DIAGNOSIS — K219 Gastro-esophageal reflux disease without esophagitis: Secondary | ICD-10-CM

## 2018-10-24 DIAGNOSIS — Z23 Encounter for immunization: Secondary | ICD-10-CM

## 2018-10-24 MED ORDER — LISINOPRIL-HYDROCHLOROTHIAZIDE 10-12.5 MG PO TABS: 1.0000 | ORAL_TABLET | Freq: Every day | ORAL | 3 refills | Status: DC

## 2018-10-24 MED ORDER — LEVOTHYROXINE SODIUM 100 MCG PO TABS: 100.0000 ug | ORAL_TABLET | Freq: Every day | ORAL | 2 refills | Status: DC

## 2018-10-24 MED ORDER — OMEPRAZOLE 20 MG PO CPDR: 20.0000 mg | DELAYED_RELEASE_CAPSULE | Freq: Every day | ORAL | 2 refills | Status: DC

## 2018-10-24 NOTE — Patient Instructions (Addendum)
   If you have lab work done today you will be contacted with your lab results within the next 2 weeks.  If you have not heard from us then please contact us. The fastest way to get your results is to register for My Chart.   IF you received an x-ray today, you will receive an invoice from [facility]. Please contact [facility] at [phone] with questions or concerns regarding your invoice.   IF you received labwork today, you will receive an invoice from [facility]. Please contact [facility] at [phone] with questions or concerns regarding your invoice.   Our billing staff will not be able to assist you with questions regarding bills from these companies.  You will be contacted with the lab results as soon as they are available. The fastest way to get your results is to activate your My Chart account. Instructions are located on the last page of this paperwork. If you have not heard from us regarding the results in 2 weeks, please contact this office.       Hypertension Hypertension, commonly called high blood pressure, is when the force of blood pumping through the arteries is too strong. The arteries are the blood vessels that carry blood from the heart throughout the body. Hypertension forces the heart to work harder to pump blood and may cause arteries to become narrow or stiff. Having untreated or uncontrolled hypertension can cause heart attacks, strokes, kidney disease, and other problems. A blood pressure reading consists of a higher number over a lower number. Ideally, your blood pressure should be below 120/80. The first ("top") number is called the systolic pressure. It is a measure of the pressure in your arteries as your heart beats. The second ("bottom") number is called the diastolic pressure. It is a measure of the pressure in your arteries as the heart relaxes. What are the causes? The cause of this condition is not known. What increases the  risk? Some risk factors for high blood pressure are under your control. Others are not. Factors you can change  Smoking.  Having type 2 diabetes mellitus, high cholesterol, or both.  Not getting enough exercise or physical activity.  Being overweight.  Having too much fat, sugar, calories, or salt (sodium) in your diet.  Drinking too much alcohol. Factors that are difficult or impossible to change  Having chronic kidney disease.  Having a family history of high blood pressure.  Age. Risk increases with age.  Race. You may be at higher risk if you are African-American.  Gender. Men are at higher risk than women before age 45. After age 65, women are at higher risk than men.  Having obstructive sleep apnea.  Stress. What are the signs or symptoms? Extremely high blood pressure (hypertensive crisis) may cause:  Headache.  Anxiety.  Shortness of breath.  Nosebleed.  Nausea and vomiting.  Severe chest pain.  Jerky movements you cannot control (seizures). How is this diagnosed? This condition is diagnosed by measuring your blood pressure while you are seated, with your arm resting on a surface. The cuff of the blood pressure monitor will be placed directly against the skin of your upper arm at the level of your heart. It should be measured at least twice using the same arm. Certain conditions can cause a difference in blood pressure between your right and left arms. Certain factors can cause blood pressure readings to be lower or higher than normal (elevated) for a short period of time:    When your blood pressure is higher when you are in a health care provider's office than when you are at home, this is called white coat hypertension. Most people with this condition do not need medicines.  When your blood pressure is higher at home than when you are in a health care provider's office, this is called masked hypertension. Most people with this condition may need medicines  to control blood pressure. If you have a high blood pressure reading during one visit or you have normal blood pressure with other risk factors:  You may be asked to return on a different day to have your blood pressure checked again.  You may be asked to monitor your blood pressure at home for 1 week or longer. If you are diagnosed with hypertension, you may have other blood or imaging tests to help your health care provider understand your overall risk for other conditions. How is this treated? This condition is treated by making healthy lifestyle changes, such as eating healthy foods, exercising more, and reducing your alcohol intake. Your health care provider may prescribe medicine if lifestyle changes are not enough to get your blood pressure under control, and if:  Your systolic blood pressure is above 130.  Your diastolic blood pressure is above 80. Your personal target blood pressure may vary depending on your medical conditions, your age, and other factors. Follow these instructions at home: Eating and drinking   Eat a diet that is high in fiber and potassium, and low in sodium, added sugar, and fat. An example eating plan is called the DASH (Dietary Approaches to Stop Hypertension) diet. To eat this way: ? Eat plenty of fresh fruits and vegetables. Try to fill half of your plate at each meal with fruits and vegetables. ? Eat whole grains, such as whole wheat pasta, brown rice, or whole grain bread. Fill about one quarter of your plate with whole grains. ? Eat or drink low-fat dairy products, such as skim milk or low-fat yogurt. ? Avoid fatty cuts of meat, processed or cured meats, and poultry with skin. Fill about one quarter of your plate with lean proteins, such as fish, chicken without skin, beans, eggs, and tofu. ? Avoid premade and processed foods. These tend to be higher in sodium, added sugar, and fat.  Reduce your daily sodium intake. Most people with hypertension should  eat less than 1,500 mg of sodium a day.  Limit alcohol intake to no more than 1 drink a day for nonpregnant women and 2 drinks a day for men. One drink equals 12 oz of beer, 5 oz of wine, or 1 oz of hard liquor. Lifestyle   Work with your health care provider to maintain a healthy body weight or to lose weight. Ask what an ideal weight is for you.  Get at least 30 minutes of exercise that causes your heart to beat faster (aerobic exercise) most days of the week. Activities may include walking, swimming, or biking.  Include exercise to strengthen your muscles (resistance exercise), such as pilates or lifting weights, as part of your weekly exercise routine. Try to do these types of exercises for 30 minutes at least 3 days a week.  Do not use any products that contain nicotine or tobacco, such as cigarettes and e-cigarettes. If you need help quitting, ask your health care provider.  Monitor your blood pressure at home as told by your health care provider.  Keep all follow-up visits as told by your health care provider.   This is important. Medicines  Take over-the-counter and prescription medicines only as told by your health care provider. Follow directions carefully. Blood pressure medicines must be taken as prescribed.  Do not skip doses of blood pressure medicine. Doing this puts you at risk for problems and can make the medicine less effective.  Ask your health care provider about side effects or reactions to medicines that you should watch for. Contact a health care provider if:  You think you are having a reaction to a medicine you are taking.  You have headaches that keep coming back (recurring).  You feel dizzy.  You have swelling in your ankles.  You have trouble with your vision. Get help right away if:  You develop a severe headache or confusion.  You have unusual weakness or numbness.  You feel faint.  You have severe pain in your chest or abdomen.  You vomit  repeatedly.  You have trouble breathing. Summary  Hypertension is when the force of blood pumping through your arteries is too strong. If this condition is not controlled, it may put you at risk for serious complications.  Your personal target blood pressure may vary depending on your medical conditions, your age, and other factors. For most people, a normal blood pressure is less than 120/80.  Hypertension is treated with lifestyle changes, medicines, or a combination of both. Lifestyle changes include weight loss, eating a healthy, low-sodium diet, exercising more, and limiting alcohol. This information is not intended to replace advice given to you by your health care provider. Make sure you discuss any questions you have with your health care provider. Document Released: 10/12/2005 Document Revised: 09/09/2016 Document Reviewed: 09/09/2016 Elsevier Interactive Patient Education  2019 [employer]  

## 2018-10-24 NOTE — Progress Notes (Signed)
BP Readings from Last 3 Encounters:  10/24/18 (!) 157/74  01/11/18 (!) 146/78  12/21/17 126/64   Erika Cervantes 62 y.o.   Chief Complaint  Patient presents with  . Medication Refill    all meds     HISTORY OF PRESENT ILLNESS: This is a 62 y.o. female with history of hypertension here for follow-up and medication refill.  Also has a history of thyroid disorder and GERD.  Has no complaints or medical concerns.  HPI   Prior to Admission medications   Medication Sig Start Date End Date Taking? Authorizing Provider  levothyroxine (SYNTHROID, LEVOTHROID) 100 MCG tablet TAKE ONE TABLET BY MOUTH DAILY WITH  BREAKFAST 09/08/18  Yes Forrest Moron, MD  lisinopril-hydrochlorothiazide (PRINZIDE,ZESTORETIC) 10-12.5 MG tablet TAKE ONE TABLET BY MOUTH DAILY 07/22/18  Yes Jaynee Eagles, PA-C  omeprazole (PRILOSEC) 20 MG capsule TAKE ONE CAPSULE BY MOUTH DAILY 05/12/18  Yes Jaynee Eagles, PA-C  albuterol (PROVENTIL HFA;VENTOLIN HFA) 108 (90 Base) MCG/ACT inhaler Inhale 2 puffs into the lungs every 6 (six) hours as needed. Patient not taking: Reported on 01/11/2018 12/07/17   Jaynee Eagles, PA-C    Allergies  Allergen Reactions  . Darvocet [Propoxyphene N-Acetaminophen]     vomit    Patient Active Problem List   Diagnosis Date Noted  . Degenerative arthritis of thumb 09/04/2015  . Vitamin D deficiency 05/26/2012  . Hypothyroidism 02/26/2012  . Hyperlipidemia with target LDL less than 100 02/26/2012  . Allergic rhinitis due to allergen 02/26/2012  . HTN (hypertension) 02/26/2012  . Obesity (BMI 30.0-34.9) 02/26/2012  . Situational anxiety 02/26/2012  . Bulging lumbar disc 02/26/2012  . GERD (gastroesophageal reflux disease) 02/26/2012    Past Medical History:  Diagnosis Date  . Allergy   . GERD (gastroesophageal reflux disease)   . Hypertension   . Thyroid disease     Past Surgical History:  Procedure Laterality Date  . ABDOMINAL HYSTERECTOMY     Fibroids  . BREAST SURGERY     Right  breast mass  . COLON SURGERY    . Hammer toes    . TONSILLECTOMY      Social History   Socioeconomic History  . Marital status: Married    Spouse name: Not on file  . Number of children: Not on file  . Years of education: Not on file  . Highest education level: Not on file  Occupational History  . Not on file  Social Needs  . Financial resource strain: Not on file  . Food insecurity:    Worry: Not on file    Inability: Not on file  . Transportation needs:    Medical: Not on file    Non-medical: Not on file  Tobacco Use  . Smoking status: Former Smoker    Last attempt to quit: 08/22/1997    Years since quitting: 21.1  . Smokeless tobacco: Never Used  Substance and Sexual Activity  . Alcohol use: No  . Drug use: No  . Sexual activity: Not on file  Lifestyle  . Physical activity:    Days per week: Not on file    Minutes per session: Not on file  . Stress: Not on file  Relationships  . Social connections:    Talks on phone: Not on file    Gets together: Not on file    Attends religious service: Not on file    Active member of club or organization: Not on file    Attends meetings of clubs or organizations: Not  on file    Relationship status: Not on file  . Intimate partner violence:    Fear of current or ex partner: Not on file    Emotionally abused: Not on file    Physically abused: Not on file    Forced sexual activity: Not on file  Other Topics Concern  . Not on file  Social History Narrative  . Not on file    Family History  Problem Relation Age of Onset  . Heart disease Mother      Review of Systems  Constitutional: Negative.  Negative for chills, fever and weight loss.  HENT: Negative.  Negative for congestion and hearing loss.   Eyes: Negative.  Negative for blurred vision and double vision.  Respiratory: Negative.  Negative for cough and shortness of breath.   Cardiovascular: Negative.  Negative for chest pain and palpitations.    Gastrointestinal: Positive for heartburn.  Genitourinary: Negative.  Negative for dysuria.  Musculoskeletal: Negative.  Negative for back pain, myalgias and neck pain.  Skin: Negative.  Negative for rash.  Neurological: Negative.  Negative for dizziness, sensory change, focal weakness and headaches.  Endo/Heme/Allergies: Negative.   All other systems reviewed and are negative.   Vitals:   10/24/18 1446  BP: (!) 157/74  Pulse: 79  Resp: 18  Temp: 98.4 F (36.9 C)  SpO2: 97%    Physical Exam Vitals signs reviewed.  Constitutional:      Appearance: Normal appearance.  HENT:     Head: Normocephalic and atraumatic.     Mouth/Throat:     Mouth: Mucous membranes are moist.     Pharynx: Oropharynx is clear.  Eyes:     Extraocular Movements: Extraocular movements intact.     Conjunctiva/sclera: Conjunctivae normal.     Pupils: Pupils are equal, round, and reactive to light.  Neck:     Musculoskeletal: Normal range of motion and neck supple.  Cardiovascular:     Rate and Rhythm: Normal rate and regular rhythm.     Heart sounds: Normal heart sounds.  Pulmonary:     Effort: Pulmonary effort is normal.     Breath sounds: Normal breath sounds.  Abdominal:     General: Abdomen is flat. There is no distension.     Palpations: Abdomen is soft.     Tenderness: There is no abdominal tenderness.  Skin:    General: Skin is warm and dry.     Capillary Refill: Capillary refill takes less than 2 seconds.  Neurological:     General: No focal deficit present.     Mental Status: She is alert and oriented to person, place, and time.     Sensory: No sensory deficit.     Motor: No weakness.  Psychiatric:        Mood and Affect: Mood normal.        Behavior: Behavior normal.       ASSESSMENT & PLAN: Erika Cervantes was seen today for medication refill.  Diagnoses and all orders for this visit:  Essential hypertension -     lisinopril-hydrochlorothiazide (PRINZIDE,ZESTORETIC) 10-12.5 MG  tablet; Take 1 tablet by mouth daily.  Need for influenza vaccination -     Flu Vaccine QUAD 36+ mos IM  Colon cancer screening -     Ambulatory referral to Gastroenterology  Hypothyroidism, unspecified type -     levothyroxine (SYNTHROID, LEVOTHROID) 100 MCG tablet; Take 1 tablet (100 mcg total) by mouth daily with breakfast.  Gastroesophageal reflux disease without esophagitis -  omeprazole (PRILOSEC) 20 MG capsule; Take 1 capsule (20 mg total) by mouth daily.    Patient Instructions       If you have lab work done today you will be contacted with your lab results within the next 2 weeks.  If you have not heard from [place] then please contact us. The fastest way to get your results is to register for My Chart.   IF you received an x-ray today, you will receive an invoice from [facility]. Please contact [facility] at [phone] with questions or concerns regarding your invoice.   IF you received labwork today, you will receive an invoice from [place]. Please contact [facility] at [phone] with questions or concerns regarding your invoice.   Our billing staff will not be able to assist you with questions regarding bills from these companies.  You will be contacted with the lab results as soon as they are available. The fastest way to get your results is to activate your My Chart account. Instructions are located on the last page of this paperwork. If you have not heard from [place] regarding the results in 2 weeks, please contact this office.     Hypertension Hypertension, commonly called high blood pressure, is when the force of blood pumping through the arteries is too strong. The arteries are the blood vessels that carry blood from the heart throughout the body. Hypertension forces the heart to work harder to pump blood and may cause arteries to become narrow or stiff. Having untreated or uncontrolled hypertension can cause heart attacks, strokes, kidney  disease, and other problems. A blood pressure reading consists of a higher number over a lower number. Ideally, your blood pressure should be below 120/80. The first ("top") number is called the systolic pressure. It is a measure of the pressure in your arteries as your heart beats. The second ("bottom") number is called the diastolic pressure. It is a measure of the pressure in your arteries as the heart relaxes. What are the causes? The cause of this condition is not known. What increases the risk? Some risk factors for high blood pressure are under your control. Others are not. Factors you can change  Smoking.  Having type 2 diabetes mellitus, high cholesterol, or both.  Not getting enough exercise or physical activity.  Being overweight.  Having too much fat, sugar, calories, or salt (sodium) in your diet.  Drinking too much alcohol. Factors that are difficult or impossible to change  Having chronic kidney disease.  Having a family history of high blood pressure.  Age. Risk increases with age.  Race. You may be at higher risk if you are African-American.  Gender. Men are at higher risk than women before age 16. After age 37, women are at higher risk than men.  Having obstructive sleep apnea.  Stress. What are the signs or symptoms? Extremely high blood pressure (hypertensive crisis) may cause:  Headache.  Anxiety.  Shortness of breath.  Nosebleed.  Nausea and vomiting.  Severe chest pain.  Jerky movements you cannot control (seizures). How is this diagnosed? This condition is diagnosed by measuring your blood pressure while you are seated, with your arm resting on a surface. The cuff of the blood pressure monitor will be placed directly against the skin of your upper arm at the level of your heart. It should be measured at least twice using the same arm. Certain conditions can cause a difference in blood pressure between your right and left arms. Certain  factors can cause blood pressure readings to be lower or higher than normal (elevated) for a short period of time:  When your blood pressure is higher when you are in a health care provider's office than when you are at home, this is called white coat hypertension. Most people with this condition do not need medicines.  When your blood pressure is higher at home than when you are in a health care provider's office, this is called masked hypertension. Most people with this condition may need medicines to control blood pressure. If you have a high blood pressure reading during one visit or you have normal blood pressure with other risk factors:  You may be asked to return on a different day to have your blood pressure checked again.  You may be asked to monitor your blood pressure at home for 1 week or longer. If you are diagnosed with hypertension, you may have other blood or imaging tests to help your health care provider understand your overall risk for other conditions. How is this treated? This condition is treated by making healthy lifestyle changes, such as eating healthy foods, exercising more, and reducing your alcohol intake. Your health care provider may prescribe medicine if lifestyle changes are not enough to get your blood pressure under control, and if:  Your systolic blood pressure is above 130.  Your diastolic blood pressure is above 80. Your personal target blood pressure may vary depending on your medical conditions, your age, and other factors. Follow these instructions at home: Eating and drinking   Eat a diet that is high in fiber and potassium, and low in sodium, added sugar, and fat. An example eating plan is called the DASH (Dietary Approaches to Stop Hypertension) diet. To eat this way: ? Eat plenty of fresh fruits and vegetables. Try to fill half of your plate at each meal with fruits and vegetables. ? Eat whole grains, such as whole wheat pasta, brown rice, or whole  grain bread. Fill about one quarter of your plate with whole grains. ? Eat or drink low-fat dairy products, such as skim milk or low-fat yogurt. ? Avoid fatty cuts of meat, processed or cured meats, and poultry with skin. Fill about one quarter of your plate with lean proteins, such as fish, chicken without skin, beans, eggs, and tofu. ? Avoid premade and processed foods. These tend to be higher in sodium, added sugar, and fat.  Reduce your daily sodium intake. Most people with hypertension should eat less than 1,500 mg of sodium a day.  Limit alcohol intake to no more than 1 drink a day for nonpregnant women and 2 drinks a day for men. One drink equals 12 oz of beer, 5 oz of wine, or 1 oz of hard liquor. Lifestyle   Work with your health care provider to maintain a healthy body weight or to lose weight. Ask what an ideal weight is for you.  Get at least 30 minutes of exercise that causes your heart to beat faster (aerobic exercise) most days of the week. Activities may include walking, swimming, or biking.  Include exercise to strengthen your muscles (resistance exercise), such as pilates or lifting weights, as part of your weekly exercise routine. Try to do these types of exercises for 30 minutes at least 3 days a week.  Do not use any products that contain nicotine or tobacco, such as cigarettes and e-cigarettes. If you need help quitting, ask your health care provider.  Monitor your blood pressure at  home as told by your health care provider.  Keep all follow-up visits as told by your health care provider. This is important. Medicines  Take over-the-counter and prescription medicines only as told by your health care provider. Follow directions carefully. Blood pressure medicines must be taken as prescribed.  Do not skip doses of blood pressure medicine. Doing this puts you at risk for problems and can make the medicine less effective.  Ask your health care provider about side effects  or reactions to medicines that you should watch for. Contact a health care provider if:  You think you are having a reaction to a medicine you are taking.  You have headaches that keep coming back (recurring).  You feel dizzy.  You have swelling in your ankles.  You have trouble with your vision. Get help right away if:  You develop a severe headache or confusion.  You have unusual weakness or numbness.  You feel faint.  You have severe pain in your chest or abdomen.  You vomit repeatedly.  You have trouble breathing. Summary  Hypertension is when the force of blood pumping through your arteries is too strong. If this condition is not controlled, it may put you at risk for serious complications.  Your personal target blood pressure may vary depending on your medical conditions, your age, and other factors. For most people, a normal blood pressure is less than 120/80.  Hypertension is treated with lifestyle changes, medicines, or a combination of both. Lifestyle changes include weight loss, eating a healthy, low-sodium diet, exercising more, and limiting alcohol. This information is not intended to replace advice given to you by your health care provider. Make sure you discuss any questions you have with your health care provider. Document Released: 10/12/2005 Document Revised: 09/09/2016 Document Reviewed: 09/09/2016 [employer] Interactive Patient Education  2019 [employer]      Agustina Caroli, MD [facility]

## 2018-10-31 ENCOUNTER — Encounter: Payer: Self-pay | Admitting: Gastroenterology

## 2018-11-04 ENCOUNTER — Other Ambulatory Visit: Payer: Self-pay

## 2018-11-04 ENCOUNTER — Ambulatory Visit: Payer: Managed Care, Other (non HMO) | Admitting: Family Medicine

## 2018-11-04 ENCOUNTER — Encounter: Payer: Self-pay | Admitting: Family Medicine

## 2018-11-04 ENCOUNTER — Encounter

## 2018-11-04 VITALS — BP 140/84 | HR 87 | Temp 99.3°F | Ht 67.0 in | Wt 199.2 lb

## 2018-11-04 DIAGNOSIS — N10 Acute tubulo-interstitial nephritis: Secondary | ICD-10-CM

## 2018-11-04 DIAGNOSIS — R6883 Chills (without fever): Secondary | ICD-10-CM | POA: Diagnosis not present

## 2018-11-04 DIAGNOSIS — R112 Nausea with vomiting, unspecified: Secondary | ICD-10-CM

## 2018-11-04 DIAGNOSIS — M549 Dorsalgia, unspecified: Secondary | ICD-10-CM | POA: Diagnosis not present

## 2018-11-04 DIAGNOSIS — R5383 Other fatigue: Secondary | ICD-10-CM

## 2018-11-04 DIAGNOSIS — R1013 Epigastric pain: Secondary | ICD-10-CM | POA: Diagnosis not present

## 2018-11-04 DIAGNOSIS — R319 Hematuria, unspecified: Secondary | ICD-10-CM

## 2018-11-04 LAB — POCT URINALYSIS DIP (MANUAL ENTRY)
Bilirubin, UA: NEGATIVE
Glucose, UA: NEGATIVE mg/dL
Ketones, POC UA: NEGATIVE mg/dL
Nitrite, UA: NEGATIVE
Protein Ur, POC: NEGATIVE mg/dL
Spec Grav, UA: 1.02 (ref 1.010–1.025)
Urobilinogen, UA: 0.2 E.U./dL
pH, UA: 6 (ref 5.0–8.0)

## 2018-11-04 LAB — POCT CBC
Granulocyte percent: 67.1 %G (ref 37–80)
HCT, POC: 36.2 % (ref 29–41)
Hemoglobin: 12.4 g/dL (ref 11–14.6)
Lymph, poc: 2.6 (ref 0.6–3.4)
MCH, POC: 27.6 pg (ref 27–31.2)
MCHC: 34.3 g/dL (ref 31.8–35.4)
MCV: 80.5 fL (ref 76–111)
MID (cbc): 0.4 (ref 0–0.9)
MPV: 7.9 fL (ref 0–99.8)
POC Granulocyte: 6 (ref 2–6.9)
POC LYMPH PERCENT: 28.7 %L (ref 10–50)
POC MID %: 4.2 %M (ref 0–12)
Platelet Count, POC: 315 10*3/uL (ref 142–424)
RBC: 4.49 M/uL (ref 4.04–5.48)
RDW, POC: 14.7 %
WBC: 8.9 10*3/uL (ref 4.6–10.2)

## 2018-11-04 LAB — POC MICROSCOPIC URINALYSIS (UMFC): Mucus: ABSENT

## 2018-11-04 LAB — POCT INFLUENZA A/B
Influenza A, POC: NEGATIVE
Influenza B, POC: NEGATIVE

## 2018-11-04 MED ORDER — ONDANSETRON 4 MG PO TBDP: 4.0000 mg | ORAL_TABLET | Freq: Three times a day (TID) | ORAL | 0 refills | Status: DC | PRN

## 2018-11-04 MED ORDER — SULFAMETHOXAZOLE-TRIMETHOPRIM 800-160 MG PO TABS: 1.0000 | ORAL_TABLET | Freq: Two times a day (BID) | ORAL | 0 refills | Status: DC

## 2018-11-04 NOTE — Progress Notes (Signed)
By signing my name below, I, .Temidayo Atanda-Ogunleye, attest that this documentation has been prepared under the direction and in the presence of Wendie Agreste, MD. Electronically Signed: Stephania Fragmin, Scribe 11/04/2018 at 5:13 PM.  Subjective:    Patient ID: Erika Cervantes, female    DOB: [birth year], 63 y.o.   MRN: [id number] Chief Complaint  Patient presents with  . Nausea    x 5 days   . Fatigue  . Back Pain    right side   . Chills   HPI Erika Cervantes is a 63 y.o. female who presents to Primary Care at [facility] complaining of nausea, right sided back pain, fatigue, and chills.  She reports that on Saturday she experienced fatigue followed by epigastric pain (waxing and waning),and myalgia. She admits that the nausea had brought her to the point of dry heaving but denies any incidences of vomiting. No focal weakness. Her symptoms have persisted for the past 7 days. She also noted increased frequency and urgency at the onset of her other symptoms. To alleviate her pain she utilized ibuprofen (2 pills at 11AM, 2 pills at bedtime) daily. She reports a subjective fever. Her 26 year old granddaughter recently had pneumonia.  She is currently managing her acid reflux with daily Prilosec. She finds it effective. No recent stomach ulcer, blood in stool, and/or diarrhea.   She has a history of kidney stones, but reports that these symptoms are not similar to those she has experienced with stones in the past.   She has had a hysterectomy and appendectomy.    Past Medical History:  Diagnosis Date  . Allergy   . GERD (gastroesophageal reflux disease)   . Hypertension   . Thyroid disease    Past Surgical History:  Procedure Laterality Date  . ABDOMINAL HYSTERECTOMY     Fibroids  . BREAST SURGERY     Right breast mass  . COLON SURGERY    . Hammer toes    . TONSILLECTOMY     Allergies  Allergen Reactions  . Darvocet [Propoxyphene N-Acetaminophen]     vomit   Prior to  Admission medications   Medication Sig Start Date End Date Taking? Authorizing Provider  levothyroxine (SYNTHROID, LEVOTHROID) 100 MCG tablet Take 1 tablet (100 mcg total) by mouth daily with breakfast. 10/24/18 11/23/18 Yes Sagardia, Ines Bloomer, MD  lisinopril-hydrochlorothiazide (PRINZIDE,ZESTORETIC) 10-12.5 MG tablet Take 1 tablet by mouth daily. 10/24/18 01/22/19 Yes Sagardia, Ines Bloomer, MD  omeprazole (PRILOSEC) 20 MG capsule Take 1 capsule (20 mg total) by mouth daily. 10/24/18  Yes Sagardia, Ines Bloomer, MD  albuterol (PROVENTIL HFA;VENTOLIN HFA) 108 (90 Base) MCG/ACT inhaler Inhale 2 puffs into the lungs every 6 (six) hours as needed. Patient not taking: Reported on 01/11/2018 12/07/17   Jaynee Eagles, PA-C   Social History   Socioeconomic History  . Marital status: Married    Spouse name: Not on file  . Number of children: Not on file  . Years of education: Not on file  . Highest education level: Not on file  Occupational History  . Not on file  Social Needs  . Financial resource strain: Not on file  . Food insecurity:    Worry: Not on file    Inability: Not on file  . Transportation needs:    Medical: Not on file    Non-medical: Not on file  Tobacco Use  . Smoking status: Former Smoker    Last attempt to quit: 08/22/1997    Years since quitting:  21.2  . Smokeless tobacco: Never Used  Substance and Sexual Activity  . Alcohol use: No  . Drug use: No  . Sexual activity: Not on file  Lifestyle  . Physical activity:    Days per week: Not on file    Minutes per session: Not on file  . Stress: Not on file  Relationships  . Social connections:    Talks on phone: Not on file    Gets together: Not on file    Attends religious service: Not on file    Active member of club or organization: Not on file    Attends meetings of clubs or organizations: Not on file    Relationship status: Not on file  . Intimate partner violence:    Fear of current or ex partner: Not on file     Emotionally abused: Not on file    Physically abused: Not on file    Forced sexual activity: Not on file  Other Topics Concern  . Not on file  Social History Narrative  . Not on file    Review of Systems  Constitutional: Positive for fatigue and fever (subjective low). Negative for chills.  Respiratory: Positive for cough.   Gastrointestinal: Positive for nausea (Dry heaving without vomit). Negative for blood in stool and diarrhea.  Genitourinary: Positive for frequency and urgency.       Within the past week  Musculoskeletal: Positive for back pain.      Objective:   Physical Exam HENT:     Head: Normocephalic and atraumatic.  Cardiovascular:     Rate and Rhythm: Normal rate and regular rhythm.     Heart sounds: Normal heart sounds.  Pulmonary:     Effort: Pulmonary effort is normal. No respiratory distress.  Abdominal:     General: Bowel sounds are increased.     Palpations: Abdomen is soft.     Tenderness: There is abdominal tenderness in the epigastric area. There is right CVA tenderness. Negative signs include Murphy's sign and McBurney's sign.     Comments: No high pitched bowel sounds.  Skin:    Findings: No erythema, lesion or rash.  Neurological:     Mental Status: She is oriented to person, place, and time.  Psychiatric:        Mood and Affect: Mood normal.        Behavior: Behavior normal.        Thought Content: Thought content normal.    Vitals:   11/04/18 1549 11/04/18 1553  BP: (!) 155/79 140/84  Pulse: 87   Temp: 99.3 F (37.4 C)   TempSrc: Oral   SpO2: 98%   Weight: 199 lb 3.2 oz (90.4 kg)   Height: 5\' 7"  (1.702 m)    Results for orders placed or performed in visit on 11/04/18  POCT CBC  Result Value Ref Range   WBC 8.9 4.6 - 10.2 K/uL   Lymph, poc 2.6 0.6 - 3.4   POC LYMPH PERCENT 28.7 10 - 50 %L   MID (cbc) 0.4 0 - 0.9   POC MID % 4.2 0 - 12 %M   POC Granulocyte 6.0 2 - 6.9   Granulocyte percent 67.1 37 - 80 %G   RBC 4.49 4.04 - 5.48  M/uL   Hemoglobin 12.4 11 - 14.6 g/dL   HCT, POC 36.2 29 - 41 %   MCV 80.5 76 - 111 fL   MCH, POC 27.6 27 - 31.2 pg   MCHC 34.3 31.8 -  35.4 g/dL   RDW, POC 14.7 %   Platelet Count, POC 315 142 - 424 K/uL   MPV 7.9 0 - 99.8 fL  POCT urinalysis dipstick  Result Value Ref Range   Color, UA yellow yellow   Clarity, UA clear clear   Glucose, UA negative negative mg/dL   Bilirubin, UA negative negative   Ketones, POC UA negative negative mg/dL   Spec Grav, UA 1.020 1.010 - 1.025   Blood, UA trace-intact (A) negative   pH, UA 6.0 5.0 - 8.0   Protein Ur, POC negative negative mg/dL   Urobilinogen, UA 0.2 0.2 or 1.0 E.U./dL   Nitrite, UA Negative Negative   Leukocytes, UA Small (1+) (A) Negative  POCT Influenza A/B  Result Value Ref Range   Influenza A, POC Negative Negative   Influenza B, POC Negative Negative  POCT Microscopic Urinalysis ([facility])  Result Value Ref Range   WBC,UR,HPF,POC Moderate (A) None WBC/hpf   RBC,UR,HPF,POC Few (A) None RBC/hpf   Bacteria Few (A) None, Too numerous to count   Mucus Absent Absent   Epithelial Cells, UR Per Microscopy Few (A) None, Too numerous to count cells/hpf        Assessment & Plan:    Erika Cervantes is a 63 y.o. female Abdominal pain, epigastric Chills (without fever) - Plan: POCT CBC, POCT Influenza A/B, Urine Culture Right-sided back pain, unspecified back location, unspecified chronicity - Plan: POCT urinalysis dipstick, POCT Microscopic Urinalysis ([facility]), Comprehensive metabolic panel Fatigue, unspecified type - Plan: POCT CBC Hematuria, unspecified type - Plan: Urine Culture Non-intractable vomiting with nausea, unspecified vomiting type - Plan: Lipase, Comprehensive metabolic panel, ondansetron (ZOFRAN ODT) 4 MG disintegrating tablet Acute pyelonephritis - Plan: sulfamethoxazole-trimethoprim (BACTRIM DS,SEPTRA DS) 800-160 MG tablet  -Moderate WBCs on urinalysis, right CVA tenderness, nausea/vomiting, suspicious for acute  pyelonephritis.  Epigastric discomfort thought to be possibly from NSAID use or gastritis.  Less likely nephrolithiasis.  Did check lipase, CMP given area of discomfort in abdomen.  -Stop NSAIDs, return to daily omeprazole.  Zofran if needed for nausea/vomiting  -Start Bactrim DS, push fluids, 72-hour follow-up planned with ER precautions if any worsening symptoms.    Meds ordered this encounter  Medications  . ondansetron (ZOFRAN ODT) 4 MG disintegrating tablet    Sig: Take 1 tablet (4 mg total) by mouth every 8 (eight) hours as needed for nausea or vomiting.    Dispense:  10 tablet    Refill:  0  . sulfamethoxazole-trimethoprim (BACTRIM DS,SEPTRA DS) 800-160 MG tablet    Sig: Take 1 tablet by mouth 2 (two) times daily.    Dispense:  20 tablet    Refill:  0   Patient Instructions   Start septra twice per day for possible pyelonephritis.  zofran if needed for nausea.  Stop ibuprofen. Tylenol ok, but no NSAIDS for now and restart omeprazole daily.  Recheck in 3 days.  Return to the clinic or go to the nearest emergency room if any of your symptoms worsen or new symptoms occur.  Pyelonephritis, Adult Pyelonephritis is a kidney infection. The kidneys are the organs that filter a person's blood and move waste out of the bloodstream and into the urine. Urine passes from the kidneys, through the ureters, and into the bladder. There are two main types of pyelonephritis:  Infections that come on quickly without any warning (acute pyelonephritis).  Infections that last for a long period of time (chronic pyelonephritis). In most cases, the infection clears up with treatment and  does not cause further problems. More severe infections or chronic infections can sometimes spread to the bloodstream or lead to other problems with the kidneys. What are the causes? This condition is usually caused by:  Bacteria traveling from the bladder to the kidney through infected urine. The urine in the bladder  can become infected with bacteria from: ? Bladder infection (cystitis). ? Inflammation of the prostate gland (prostatitis). ? Sexual intercourse, in females.  Bacteria traveling from the bloodstream to the kidney. What increases the risk? This condition is more likely to develop in:  Pregnant women.  Older people.  People who have diabetes.  People who have kidney stones or bladder stones.  People who have other abnormalities of the kidney or ureter.  People who have a catheter placed in the bladder.  People who have cancer.  People who are sexually active.  Women who use spermicides.  People who have had a prior urinary tract infection. What are the signs or symptoms? Symptoms of this condition include:  Frequent urination.  Strong or persistent urge to urinate.  Burning or stinging when urinating.  Abdominal pain.  Back pain.  Pain in the side or flank area.  Fever.  Chills.  Blood in the urine, or dark urine.  Nausea.  Vomiting. How is this diagnosed? This condition may be diagnosed based on:  Medical history and physical exam.  Urine tests.  Blood tests. You may also have imaging tests of the kidneys, such as an ultrasound or CT scan. How is this treated? Treatment for this condition may depend on the severity of the infection.  If the infection is mild and is found early, you may be treated with antibiotic medicines taken by mouth. You will need to drink fluids to remain hydrated.  If the infection is more severe, you may need to stay in the hospital and receive antibiotics given directly into a vein through an IV tube. You may also need to receive fluids through an IV tube if you are not able to remain hydrated. After your hospital stay, you may need to take oral antibiotics for a period of time. Other treatments may be required, depending on the cause of the infection. Follow these instructions at home: Medicines  Take over-the-counter and  prescription medicines only as told by your health care provider.  If you were prescribed an antibiotic medicine, take it as told by your health care provider. Do not stop taking the antibiotic even if you start to feel better. General instructions  Drink enough fluid to keep your urine clear or pale yellow.  Avoid caffeine, tea, and carbonated beverages. They tend to irritate the bladder.  Urinate often. Avoid holding in urine for long periods of time.  Urinate before and after sex.  After a bowel movement, women should cleanse from front to back. Use each tissue only once.  Keep all follow-up visits as told by your health care provider. This is important. Contact a health care provider if:  Your symptoms do not get better after 2 days of treatment.  Your symptoms get worse.  You have a fever. Get help right away if:  You are unable to take your antibiotics or fluids.  You have shaking chills.  You vomit.  You have severe flank or back pain.  You have extreme weakness or fainting. This information is not intended to replace advice given to you by your health care provider. Make sure you discuss any questions you have with your health care  provider. Document Released: 10/12/2005 Document Revised: 03/19/2016 Document Reviewed: 02/04/2015 Elsevier Interactive Patient Education  [employer].   If you have lab work done today you will be contacted with your lab results within the next 2 weeks.  If you have not heard from [place] then please contact us. The fastest way to get your results is to register for My Chart.   IF you received an x-ray today, you will receive an invoice from [facility]. Please contact [facility] at [phone] with questions or concerns regarding your invoice.   IF you received labwork today, you will receive an invoice from [place]. Please contact [facility] at [phone] with questions or concerns regarding your invoice.    Our billing staff will not be able to assist you with questions regarding bills from these companies.  You will be contacted with the lab results as soon as they are available. The fastest way to get your results is to activate your My Chart account. Instructions are located on the last page of this paperwork. If you have not heard from [place] regarding the results in 2 weeks, please contact this office.       I personally performed the services described in this documentation, which was scribed in my presence. The recorded information has been reviewed and considered for accuracy and completeness, addended by me as needed, and agree with information above.  Signed,   [name], MD Primary [facility] at [place].  11/06/18 9:19 PM

## 2018-11-04 NOTE — Patient Instructions (Addendum)
Start septra twice per day for possible pyelonephritis.  zofran if needed for nausea.  Stop ibuprofen. Tylenol ok, but no NSAIDS for now and restart omeprazole daily.  Recheck in 3 days.  Return to the clinic or go to the nearest emergency room if any of your symptoms worsen or new symptoms occur.  Pyelonephritis, Adult Pyelonephritis is a kidney infection. The kidneys are the organs that filter a person's blood and move waste out of the bloodstream and into the urine. Urine passes from the kidneys, through the ureters, and into the bladder. There are two main types of pyelonephritis:  Infections that come on quickly without any warning (acute pyelonephritis).  Infections that last for a long period of time (chronic pyelonephritis). In most cases, the infection clears up with treatment and does not cause further problems. More severe infections or chronic infections can sometimes spread to the bloodstream or lead to other problems with the kidneys. What are the causes? This condition is usually caused by:  Bacteria traveling from the bladder to the kidney through infected urine. The urine in the bladder can become infected with bacteria from: ? Bladder infection (cystitis). ? Inflammation of the prostate gland (prostatitis). ? Sexual intercourse, in females.  Bacteria traveling from the bloodstream to the kidney. What increases the risk? This condition is more likely to develop in:  Pregnant women.  Older people.  People who have diabetes.  People who have kidney stones or bladder stones.  People who have other abnormalities of the kidney or ureter.  People who have a catheter placed in the bladder.  People who have cancer.  People who are sexually active.  Women who use spermicides.  People who have had a prior urinary tract infection. What are the signs or symptoms? Symptoms of this condition include:  Frequent urination.  Strong or persistent urge to  urinate.  Burning or stinging when urinating.  Abdominal pain.  Back pain.  Pain in the side or flank area.  Fever.  Chills.  Blood in the urine, or dark urine.  Nausea.  Vomiting. How is this diagnosed? This condition may be diagnosed based on:  Medical history and physical exam.  Urine tests.  Blood tests. You may also have imaging tests of the kidneys, such as an ultrasound or CT scan. How is this treated? Treatment for this condition may depend on the severity of the infection.  If the infection is mild and is found early, you may be treated with antibiotic medicines taken by mouth. You will need to drink fluids to remain hydrated.  If the infection is more severe, you may need to stay in the hospital and receive antibiotics given directly into a vein through an IV tube. You may also need to receive fluids through an IV tube if you are not able to remain hydrated. After your hospital stay, you may need to take oral antibiotics for a period of time. Other treatments may be required, depending on the cause of the infection. Follow these instructions at home: Medicines  Take over-the-counter and prescription medicines only as told by your health care provider.  If you were prescribed an antibiotic medicine, take it as told by your health care provider. Do not stop taking the antibiotic even if you start to feel better. General instructions  Drink enough fluid to keep your urine clear or pale yellow.  Avoid caffeine, tea, and carbonated beverages. They tend to irritate the bladder.  Urinate often. Avoid holding in urine for long  periods of time.  Urinate before and after sex.  After a bowel movement, women should cleanse from front to back. Use each tissue only once.  Keep all follow-up visits as told by your health care provider. This is important. Contact a health care provider if:  Your symptoms do not get better after 2 days of treatment.  Your symptoms  get worse.  You have a fever. Get help right away if:  You are unable to take your antibiotics or fluids.  You have shaking chills.  You vomit.  You have severe flank or back pain.  You have extreme weakness or fainting. This information is not intended to replace advice given to you by your health care provider. Make sure you discuss any questions you have with your health care provider. Document Released: 10/12/2005 Document Revised: 03/19/2016 Document Reviewed: 02/04/2015 Elsevier Interactive Patient Education  [employer].   If you have lab work done today you will be contacted with your lab results within the next 2 weeks.  If you have not heard from [place] then please contact us. The fastest way to get your results is to register for My Chart.   IF you received an x-ray today, you will receive an invoice from [facility]. Please contact [facility] at [phone] with questions or concerns regarding your invoice.   IF you received labwork today, you will receive an invoice from [place]. Please contact [facility] at [phone] with questions or concerns regarding your invoice.   Our billing staff will not be able to assist you with questions regarding bills from these companies.  You will be contacted with the lab results as soon as they are available. The fastest way to get your results is to activate your My Chart account. Instructions are located on the last page of this paperwork. If you have not heard from [place] regarding the results in 2 weeks, please contact this office.

## 2018-11-04 NOTE — Progress Notes (Signed)
[place]

## 2018-11-05 LAB — COMPREHENSIVE METABOLIC PANEL
ALT: 11 IU/L (ref 0–32)
AST: 21 IU/L (ref 0–40)
Albumin/Globulin Ratio: 1.1 — ABNORMAL LOW (ref 1.2–2.2)
Albumin: 4 g/dL (ref 3.6–4.8)
Alkaline Phosphatase: 49 IU/L (ref 39–117)
BUN/Creatinine Ratio: 11 — ABNORMAL LOW (ref 12–28)
BUN: 16 mg/dL (ref 8–27)
Bilirubin Total: 0.3 mg/dL (ref 0.0–1.2)
CO2: 22 mmol/L (ref 20–29)
Calcium: 9.8 mg/dL (ref 8.7–10.3)
Chloride: 103 mmol/L (ref 96–106)
Creatinine, Ser: 1.41 mg/dL — ABNORMAL HIGH (ref 0.57–1.00)
GFR calc Af Amer: 46 mL/min/{1.73_m2} — ABNORMAL LOW (ref >59)
GFR calc non Af Amer: 40 mL/min/{1.73_m2} — ABNORMAL LOW (ref >59)
Globulin, Total: 3.7 g/dL (ref 1.5–4.5)
Glucose: 77 mg/dL (ref 65–99)
Potassium: 4.9 mmol/L (ref 3.5–5.2)
Sodium: 142 mmol/L (ref 134–144)
Total Protein: 7.7 g/dL (ref 6.0–8.5)

## 2018-11-05 LAB — URINE CULTURE

## 2018-11-05 LAB — LIPASE: Lipase: 57 U/L (ref 14–72)

## 2018-11-06 ENCOUNTER — Emergency Department (HOSPITAL_COMMUNITY): Payer: Managed Care, Other (non HMO)

## 2018-11-06 ENCOUNTER — Other Ambulatory Visit: Payer: Self-pay

## 2018-11-06 ENCOUNTER — Encounter (HOSPITAL_COMMUNITY): Payer: Self-pay

## 2018-11-06 ENCOUNTER — Observation Stay (HOSPITAL_COMMUNITY)
Admission: EM | Admit: 2018-11-06 | Discharge: 2018-11-08 | Disposition: A | Discharge status: Home / Self Care | Payer: Managed Care, Other (non HMO) | Attending: Internal Medicine | Admitting: Internal Medicine

## 2018-11-06 DIAGNOSIS — K219 Gastro-esophageal reflux disease without esophagitis: Secondary | ICD-10-CM | POA: Diagnosis not present

## 2018-11-06 DIAGNOSIS — Z7951 Long term (current) use of inhaled steroids: Secondary | ICD-10-CM | POA: Diagnosis not present

## 2018-11-06 DIAGNOSIS — I10 Essential (primary) hypertension: Secondary | ICD-10-CM | POA: Diagnosis present

## 2018-11-06 DIAGNOSIS — K859 Acute pancreatitis without necrosis or infection, unspecified: Principal | ICD-10-CM | POA: Diagnosis present

## 2018-11-06 DIAGNOSIS — Z87442 Personal history of urinary calculi: Secondary | ICD-10-CM | POA: Diagnosis not present

## 2018-11-06 DIAGNOSIS — Z90710 Acquired absence of both cervix and uterus: Secondary | ICD-10-CM | POA: Diagnosis not present

## 2018-11-06 DIAGNOSIS — Z6830 Body mass index (BMI) 30.0-30.9, adult: Secondary | ICD-10-CM | POA: Diagnosis not present

## 2018-11-06 DIAGNOSIS — E669 Obesity, unspecified: Secondary | ICD-10-CM | POA: Insufficient documentation

## 2018-11-06 DIAGNOSIS — Z885 Allergy status to narcotic agent status: Secondary | ICD-10-CM | POA: Insufficient documentation

## 2018-11-06 DIAGNOSIS — Z79899 Other long term (current) drug therapy: Secondary | ICD-10-CM | POA: Insufficient documentation

## 2018-11-06 DIAGNOSIS — I700 Atherosclerosis of aorta: Secondary | ICD-10-CM | POA: Insufficient documentation

## 2018-11-06 DIAGNOSIS — I129 Hypertensive chronic kidney disease with stage 1 through stage 4 chronic kidney disease, or unspecified chronic kidney disease: Secondary | ICD-10-CM | POA: Insufficient documentation

## 2018-11-06 DIAGNOSIS — N179 Acute kidney failure, unspecified: Secondary | ICD-10-CM | POA: Diagnosis not present

## 2018-11-06 DIAGNOSIS — E785 Hyperlipidemia, unspecified: Secondary | ICD-10-CM | POA: Insufficient documentation

## 2018-11-06 DIAGNOSIS — R1013 Epigastric pain: Secondary | ICD-10-CM | POA: Insufficient documentation

## 2018-11-06 DIAGNOSIS — E039 Hypothyroidism, unspecified: Secondary | ICD-10-CM | POA: Diagnosis not present

## 2018-11-06 DIAGNOSIS — N183 Chronic kidney disease, stage 3 (moderate): Secondary | ICD-10-CM | POA: Insufficient documentation

## 2018-11-06 DIAGNOSIS — K760 Fatty (change of) liver, not elsewhere classified: Secondary | ICD-10-CM | POA: Diagnosis not present

## 2018-11-06 DIAGNOSIS — D631 Anemia in chronic kidney disease: Secondary | ICD-10-CM | POA: Insufficient documentation

## 2018-11-06 DIAGNOSIS — Z86010 Personal history of colonic polyps: Secondary | ICD-10-CM | POA: Insufficient documentation

## 2018-11-06 DIAGNOSIS — Z8249 Family history of ischemic heart disease and other diseases of the circulatory system: Secondary | ICD-10-CM | POA: Diagnosis not present

## 2018-11-06 DIAGNOSIS — F17200 Nicotine dependence, unspecified, uncomplicated: Secondary | ICD-10-CM | POA: Diagnosis not present

## 2018-11-06 DIAGNOSIS — N390 Urinary tract infection, site not specified: Secondary | ICD-10-CM | POA: Diagnosis not present

## 2018-11-06 HISTORY — DX: Other specified postprocedural states: Z98.890

## 2018-11-06 HISTORY — DX: Nausea with vomiting, unspecified: R11.2

## 2018-11-06 LAB — URINALYSIS, ROUTINE W REFLEX MICROSCOPIC
Bilirubin Urine: NEGATIVE
Glucose, UA: NEGATIVE mg/dL
Hgb urine dipstick: NEGATIVE
Ketones, ur: NEGATIVE mg/dL
Nitrite: NEGATIVE
Protein, ur: NEGATIVE mg/dL
Specific Gravity, Urine: 1.006 (ref 1.005–1.030)
pH: 6 (ref 5.0–8.0)

## 2018-11-06 LAB — COMPREHENSIVE METABOLIC PANEL
ALT: 13 U/L (ref 0–44)
AST: 22 U/L (ref 15–41)
Albumin: 3.8 g/dL (ref 3.5–5.0)
Alkaline Phosphatase: 38 U/L (ref 38–126)
Anion gap: 9 (ref 5–15)
BUN: 12 mg/dL (ref 8–23)
CO2: 24 mmol/L (ref 22–32)
Calcium: 9.4 mg/dL (ref 8.9–10.3)
Chloride: 105 mmol/L (ref 98–111)
Creatinine, Ser: 1.69 mg/dL — ABNORMAL HIGH (ref 0.44–1.00)
GFR calc Af Amer: 37 mL/min — ABNORMAL LOW (ref >60)
GFR calc non Af Amer: 32 mL/min — ABNORMAL LOW (ref >60)
Glucose, Bld: 89 mg/dL (ref 70–99)
Potassium: 4 mmol/L (ref 3.5–5.1)
Sodium: 138 mmol/L (ref 135–145)
Total Bilirubin: 0.8 mg/dL (ref 0.3–1.2)
Total Protein: 8.3 g/dL — ABNORMAL HIGH (ref 6.5–8.1)

## 2018-11-06 LAB — CBC WITH DIFFERENTIAL/PLATELET
Abs Immature Granulocytes: 0.03 10*3/uL (ref 0.00–0.07)
Basophils Absolute: 0.1 10*3/uL (ref 0.0–0.1)
Basophils Relative: 1 %
Eosinophils Absolute: 0.1 10*3/uL (ref 0.0–0.5)
Eosinophils Relative: 2 %
HCT: 38 % (ref 36.0–46.0)
Hemoglobin: 12 g/dL (ref 12.0–15.0)
Immature Granulocytes: 0 %
Lymphocytes Relative: 24 %
Lymphs Abs: 2.1 10*3/uL (ref 0.7–4.0)
MCH: 26.4 pg (ref 26.0–34.0)
MCHC: 31.6 g/dL (ref 30.0–36.0)
MCV: 83.7 fL (ref 80.0–100.0)
Monocytes Absolute: 0.5 10*3/uL (ref 0.1–1.0)
Monocytes Relative: 5 %
Neutro Abs: 5.9 10*3/uL (ref 1.7–7.7)
Neutrophils Relative %: 68 %
Platelets: 293 10*3/uL (ref 150–400)
RBC: 4.54 MIL/uL (ref 3.87–5.11)
RDW: 14.4 % (ref 11.5–15.5)
WBC: 8.8 10*3/uL (ref 4.0–10.5)
nRBC: 0 % (ref 0.0–0.2)

## 2018-11-06 LAB — LIPID PANEL
Cholesterol: 229 mg/dL — ABNORMAL HIGH (ref 0–200)
HDL: 39 mg/dL — ABNORMAL LOW (ref >40)
LDL Cholesterol: 153 mg/dL — ABNORMAL HIGH (ref 0–99)
Total CHOL/HDL Ratio: 5.9 RATIO
Triglycerides: 186 mg/dL — ABNORMAL HIGH (ref <150)
VLDL: 37 mg/dL (ref 0–40)

## 2018-11-06 LAB — CBC
HCT: 34.6 % — ABNORMAL LOW (ref 36.0–46.0)
Hemoglobin: 11.1 g/dL — ABNORMAL LOW (ref 12.0–15.0)
MCH: 26.6 pg (ref 26.0–34.0)
MCHC: 32.1 g/dL (ref 30.0–36.0)
MCV: 82.8 fL (ref 80.0–100.0)
Platelets: 288 10*3/uL (ref 150–400)
RBC: 4.18 MIL/uL (ref 3.87–5.11)
RDW: 14.3 % (ref 11.5–15.5)
WBC: 9 10*3/uL (ref 4.0–10.5)
nRBC: 0 % (ref 0.0–0.2)

## 2018-11-06 LAB — CREATININE, SERUM
Creatinine, Ser: 1.5 mg/dL — ABNORMAL HIGH (ref 0.44–1.00)
GFR calc Af Amer: 43 mL/min — ABNORMAL LOW (ref >60)
GFR calc non Af Amer: 37 mL/min — ABNORMAL LOW (ref >60)

## 2018-11-06 LAB — C-REACTIVE PROTEIN: CRP: 1.5 mg/dL — ABNORMAL HIGH (ref <1.0)

## 2018-11-06 LAB — TROPONIN I: Troponin I: <0.03 ng/mL (ref <0.03)

## 2018-11-06 LAB — LIPASE, BLOOD: Lipase: 60 U/L — ABNORMAL HIGH (ref 11–51)

## 2018-11-06 MED ORDER — LACTATED RINGERS IV BOLUS
1000.0000 mL | Freq: Once | INTRAVENOUS | Status: AC
  Administered 2018-11-06: 1000 mL via INTRAVENOUS

## 2018-11-06 MED ORDER — ENOXAPARIN SODIUM 40 MG/0.4ML ~~LOC~~ SOLN
40.0000 mg | SUBCUTANEOUS | Status: DC
  Administered 2018-11-06: 40 mg via SUBCUTANEOUS
  Filled 2018-11-06: qty 0.4

## 2018-11-06 MED ORDER — MORPHINE SULFATE (PF) 4 MG/ML IV SOLN
4.0000 mg | Freq: Once | INTRAVENOUS | Status: AC
  Administered 2018-11-06: 4 mg via INTRAVENOUS
  Filled 2018-11-06: qty 1

## 2018-11-06 MED ORDER — LACTATED RINGERS IV SOLN
INTRAVENOUS | Status: AC
  Administered 2018-11-06 – 2018-11-07 (×3): via INTRAVENOUS

## 2018-11-06 MED ORDER — IOHEXOL 300 MG/ML  SOLN
100.0000 mL | Freq: Once | INTRAMUSCULAR | Status: AC | PRN
  Administered 2018-11-06: 80 mL via INTRAVENOUS

## 2018-11-06 MED ORDER — MORPHINE SULFATE (PF) 2 MG/ML IV SOLN
2.0000 mg | INTRAVENOUS | Status: DC | PRN
  Administered 2018-11-07 (×3): 2 mg via INTRAVENOUS
  Filled 2018-11-06 (×3): qty 1

## 2018-11-06 MED ORDER — PANTOPRAZOLE SODIUM 40 MG IV SOLR
40.0000 mg | INTRAVENOUS | Status: DC
  Administered 2018-11-06 – 2018-11-07 (×2): 40 mg via INTRAVENOUS
  Filled 2018-11-06 (×2): qty 40

## 2018-11-06 MED ORDER — ONDANSETRON HCL 4 MG PO TABS: 4.0000 mg | ORAL_TABLET | Freq: Four times a day (QID) | ORAL | Status: DC | PRN

## 2018-11-06 MED ORDER — ONDANSETRON HCL 4 MG/2ML IJ SOLN
4.0000 mg | Freq: Once | INTRAMUSCULAR | Status: AC
  Administered 2018-11-06: 4 mg via INTRAVENOUS
  Filled 2018-11-06: qty 2

## 2018-11-06 MED ORDER — ONDANSETRON HCL 4 MG/2ML IJ SOLN: 4.0000 mg | Freq: Four times a day (QID) | INTRAMUSCULAR | Status: DC | PRN

## 2018-11-06 MED ORDER — HYDRALAZINE HCL 20 MG/ML IJ SOLN: 10.0000 mg | INTRAMUSCULAR | Status: DC | PRN

## 2018-11-06 MED ORDER — SODIUM CHLORIDE 0.9 % IV BOLUS
1000.0000 mL | Freq: Once | INTRAVENOUS | Status: AC
  Administered 2018-11-06: 1000 mL via INTRAVENOUS

## 2018-11-06 MED ORDER — LEVOTHYROXINE SODIUM 100 MCG/5ML IV SOLN
50.0000 ug | Freq: Every day | INTRAVENOUS | Status: DC
  Administered 2018-11-07 – 2018-11-08 (×2): 50 ug via INTRAVENOUS
  Filled 2018-11-06 (×2): qty 5

## 2018-11-06 NOTE — ED Triage Notes (Signed)
Pt c/o epigastric pain radiating to the right back side along with some n/v ; pt also reporting having trouble swallowing ; pt states " it feels like something is stuck and it wont go all the way down ; pt denies any chest pain or sob

## 2018-11-06 NOTE — H&P (Signed)
History and Physical    Erika Cervantes PTW:[id number] DOB: 15-May-1956 DOA: 11/06/2018  PCP: Horald Pollen, MD  Patient coming from: Home.  Chief Complaint: Abdominal pain.  HPI: Erika Cervantes is a 63 y.o. female with history of hypertension, hypothyroidism, GERD presents to the ER because of worsening abdominal pain over the last 1 week.  Patient states the pain is in the epigastric area radiating the back initially had some nausea vomiting.  Denies any diarrhea.  Patient initially gone to her PCP and at that time features were concerning for UTI and was placed on Bactrim.  Despite which patient's pain worsened and came to the ER.  Patient denies drinking alcohol.  Has not had any previous history of pancreatitis.  Has not been recently started on any new medications except for the antibiotics.  ED Course: In the ER labs revealed a lipase around 60 mild elevated and CT of the abdomen and pelvis done shows features concerning for pancreatitis other differential includes duodenitis.  Per CAT scan report patient will need MRI of the abdomen and pelvis once symptoms resolve to check for any further pathologies.  Patient has significant pain and is requiring narcotics and also started on fluids.  Denies any chest pain or shortness of breath.  Review of Systems: As per HPI, rest all negative.   Past Medical History:  Diagnosis Date  . Allergy   . GERD (gastroesophageal reflux disease)   . Hypertension   . Thyroid disease     Past Surgical History:  Procedure Laterality Date  . ABDOMINAL HYSTERECTOMY     Fibroids  . BREAST SURGERY     Right breast mass  . COLON SURGERY    . Hammer toes    . TONSILLECTOMY       reports that she quit smoking about 21 years ago. She has never used smokeless tobacco. She reports that she does not drink alcohol or use drugs.  Allergies  Allergen Reactions  . Darvocet [Propoxyphene N-Acetaminophen] Nausea And Vomiting    Family History  Problem  Relation Age of Onset  . Heart disease Mother     Prior to Admission medications   Medication Sig Start Date End Date Taking? Authorizing Provider  acetaminophen (TYLENOL) 650 MG CR tablet Take 650 mg by mouth every 8 (eight) hours as needed for pain.   Yes [provider]  levothyroxine (SYNTHROID, LEVOTHROID) 100 MCG tablet Take 1 tablet (100 mcg total) by mouth daily with breakfast. 10/24/18 11/23/18 Yes Sagardia, Ines Bloomer, MD  lisinopril-hydrochlorothiazide (PRINZIDE,ZESTORETIC) 10-12.5 MG tablet Take 1 tablet by mouth daily. 10/24/18 01/22/19 Yes Sagardia, Ines Bloomer, MD  omeprazole (PRILOSEC) 20 MG capsule Take 1 capsule (20 mg total) by mouth daily. 10/24/18  Yes Sagardia, Ines Bloomer, MD  ondansetron (ZOFRAN ODT) 4 MG disintegrating tablet Take 1 tablet (4 mg total) by mouth every 8 (eight) hours as needed for nausea or vomiting. 11/04/18  Yes Wendie Agreste, MD  sulfamethoxazole-trimethoprim (BACTRIM DS,SEPTRA DS) 800-160 MG tablet Take 1 tablet by mouth 2 (two) times daily. 11/04/18  Yes Wendie Agreste, MD  albuterol (PROVENTIL HFA;VENTOLIN HFA) 108 (90 Base) MCG/ACT inhaler Inhale 2 puffs into the lungs every 6 (six) hours as needed. Patient not taking: Reported on 01/11/2018 12/07/17   Jaynee Eagles, [place]    Physical Exam: Vitals:   11/06/18 1615 11/06/18 1630 11/06/18 1645 11/06/18 2037  BP: (!) 159/74 120/74 (!) 142/86 130/80  Pulse: 82 90 80 76  Resp: 13 (!) 22 (!)  23 16  Temp:    98.1 F (36.7 C)  TempSrc:    Oral  SpO2: 98% 97% 100% 100%  Weight:      Height:          Constitutional: Moderately built and nourished. Vitals:   11/06/18 1615 11/06/18 1630 11/06/18 1645 11/06/18 2037  BP: (!) 159/74 120/74 (!) 142/86 130/80  Pulse: 82 90 80 76  Resp: 13 (!) 22 (!) 23 16  Temp:    98.1 F (36.7 C)  TempSrc:    Oral  SpO2: 98% 97% 100% 100%  Weight:      Height:       Eyes: Anicteric no pallor. ENMT: No discharge from the ears eyes nose or  mouth. Neck: No mass or.  No neck rigidity.  No JVD appreciated. Respiratory: No rhonchi or crepitations. Cardiovascular: S1-S2 heard. Abdomen: Soft nontender bowel sounds present. Musculoskeletal: No edema.  No joint effusion. Skin: No rash. Neurologic: Alert awake oriented to time place and person.  Moves all extremities. Psychiatric: Appears normal.  Normal affect.   Labs on Admission: I have personally reviewed following labs and imaging studies  CBC: Recent Labs  Lab 11/04/18 1656 11/06/18 1657  WBC 8.9 8.8  NEUTROABS  --  5.9  HGB 12.4 12.0  HCT 36.2 38.0  MCV 80.5 83.7  PLT  --  010   Basic Metabolic Panel: Recent Labs  Lab 11/04/18 1712 11/06/18 1657  NA 142 138  K 4.9 4.0  CL 103 105  CO2 22 24  GLUCOSE 77 89  BUN 16 12  CREATININE 1.41* 1.69*  CALCIUM 9.8 9.4   GFR: Estimated Creatinine Clearance: 39.8 mL/min (A) (by C-G formula based on SCr of 1.69 mg/dL (H)). Liver Function Tests: Recent Labs  Lab 11/04/18 1712 11/06/18 1657  AST 21 22  ALT 11 13  ALKPHOS 49 38  BILITOT 0.3 0.8  PROT 7.7 8.3*  ALBUMIN 4.0 3.8   Recent Labs  Lab 11/04/18 1712 11/06/18 1657  LIPASE 57 60*   No results for input(s): AMMONIA in the last 168 hours. Coagulation Profile: No results for input(s): INR, PROTIME in the last 168 hours. Cardiac Enzymes: No results for input(s): CKTOTAL, CKMB, CKMBINDEX, TROPONINI in the last 168 hours. BNP (last 3 results) No results for input(s): PROBNP in the last 8760 hours. HbA1C: No results for input(s): HGBA1C in the last 72 hours. CBG: No results for input(s): GLUCAP in the last 168 hours. Lipid Profile: Recent Labs    11/06/18 1657  CHOL 229*  HDL 39*  LDLCALC 153*  TRIG 186*  CHOLHDL 5.9   Thyroid Function Tests: No results for input(s): TSH, T4TOTAL, FREET4, T3FREE, THYROIDAB in the last 72 hours. Anemia Panel: No results for input(s): VITAMINB12, FOLATE, FERRITIN, TIBC, IRON, RETICCTPCT in the last 72  hours. Urine analysis:    Component Value Date/Time   COLORURINE STRAW (A) 11/06/2018 1657   APPEARANCEUR CLEAR 11/06/2018 1657   LABSPEC 1.006 11/06/2018 1657   PHURINE 6.0 11/06/2018 1657   GLUCOSEU NEGATIVE 11/06/2018 1657   HGBUR NEGATIVE 11/06/2018 1657   BILIRUBINUR NEGATIVE 11/06/2018 1657   BILIRUBINUR negative 11/04/2018 1608   KETONESUR NEGATIVE 11/06/2018 1657   PROTEINUR NEGATIVE 11/06/2018 1657   UROBILINOGEN 0.2 11/04/2018 1608   UROBILINOGEN 1.0 04/17/2007 2319   NITRITE NEGATIVE 11/06/2018 1657   LEUKOCYTESUR LARGE (A) 11/06/2018 1657   Sepsis Labs: @LABRCNTIP (procalcitonin:4,lacticidven:4) ) Recent Results (from the past 240 hour(s))  Urine Culture     Status:  None   Collection Time: 11/04/18  5:18 PM  Result Value Ref Range Status   Urine Culture, Routine Final report  Final   Organism ID, Bacteria Comment  Final    Comment: Mixed urogenital flora Less than 10,000 colonies/mL      Radiological Exams on Admission: Dg Chest 2 View  Result Date: 11/06/2018 CLINICAL DATA:  Right-sided chest pain for 1 week.  Smoker. EXAM: CHEST - 2 VIEW COMPARISON:  02/25/2013 FINDINGS: The heart size and mediastinal contours are within normal limits. Both lungs are clear. The visualized skeletal structures are unremarkable. IMPRESSION: No active cardiopulmonary disease. Electronically Signed   By: Earle Gell M.D.   On: 11/06/2018 16:44   Ct Abdomen Pelvis W Contrast  Result Date: 11/06/2018 CLINICAL DATA:  Epigastric pain radiating to the right side of the back. Nausea and vomiting. EXAM: CT ABDOMEN AND PELVIS WITH CONTRAST TECHNIQUE: Multidetector CT imaging of the abdomen and pelvis was performed using the standard protocol following bolus administration of intravenous contrast. CONTRAST:  72mL OMNIPAQUE IOHEXOL 300 MG/ML  SOLN COMPARISON:  04/18/2007. FINDINGS: Lower chest: Unremarkable. Hepatobiliary: 8 mm low-density lesion in the dome of the left liver is too small to  characterize but likely a cyst. Liver otherwise unremarkable. There is no evidence for gallstones, gallbladder wall thickening, or pericholecystic fluid. No intrahepatic or extrahepatic biliary dilation. Pancreas: Subtle edema/inflammation is seen around the head of pancreas and uncinate process (axial image 41/series 3). No dilatation of the main pancreatic duct. Fullness noted in the parenchyma of the pancreatic head, presumably edema. Spleen: No splenomegaly. No focal mass lesion. Adrenals/Urinary Tract: No adrenal nodule or mass. Tiny hypodensity in the interpolar right kidney is too small to characterize. Left kidney unremarkable. No evidence for hydroureter. The urinary bladder appears normal for the degree of distention. Stomach/Bowel: Stomach is nondistended. No gastric wall thickening. No evidence of outlet obstruction. There is some edema along the medial wall of the descending duodenum and around the proximal transverse segment. No small bowel wall thickening. No small bowel dilatation. The terminal ileum is normal. The appendix is not visualized, but there is no edema or inflammation in the region of the cecum. No gross colonic mass. No colonic wall thickening. Vascular/Lymphatic: There is abdominal aortic atherosclerosis without aneurysm. There is no gastrohepatic or hepatoduodenal ligament lymphadenopathy. No intraperitoneal or retroperitoneal lymphadenopathy. No pelvic sidewall lymphadenopathy. Reproductive: Uterus surgically absent.  There is no adnexal mass. Other: No intraperitoneal free fluid. Musculoskeletal: No worrisome lytic or sclerotic osseous abnormality. IMPRESSION: 1. Subtle edema/inflammation around the head of pancreas and uncinate process with fullness in the pancreatic head. Although there is some edema adjacent to the descending and proximal transverse duodenum, pancreas appears to be the epicenter of the changes and acute pancreatitis is favored over duodenitis. Soft tissue fullness  in the head of pancreas likely reflects edema in the parenchyma, but follow-up MRI of the abdomen without and with contrast after resolution of acute symptoms recommended. No dilatation of the main pancreatic duct. No evidence for pancreatic necrosis or pseudocyst at this time. 2.  Aortic Atherosclerois (ICD10-170.0) Electronically Signed   By: Misty Stanley M.D.   On: 11/06/2018 19:18   US Abdomen Limited  Result Date: 11/06/2018 CLINICAL DATA:  Initial evaluation for acute right upper quadrant pain. EXAM: ULTRASOUND ABDOMEN LIMITED RIGHT UPPER QUADRANT COMPARISON:  None. FINDINGS: Gallbladder: No gallstones or wall thickening visualized. No sonographic Murphy sign noted by sonographer. Common bile duct: Diameter: 2.7 mm Liver: No focal lesion identified.  Increased echogenicity within the patent parenchyma, suggesting steatosis. Portal vein is patent on color Doppler imaging with normal direction of blood flow towards the liver. IMPRESSION: 1. Normal sonographic appearance of the gallbladder. No cholelithiasis, acute cholecystitis, or biliary dilatation. 2. Mild hepatic steatosis. Electronically Signed   By: Jeannine Boga M.D.   On: 11/06/2018 17:14    EKG: Independently reviewed.  Normal sinus rhythm.  Assessment/Plan Principal Problem:   Acute pancreatitis Active Problems:   Hypothyroidism   Hyperlipidemia with target LDL less than 100   HTN (hypertension)   Obesity (BMI 30.0-34.9)   GERD (gastroesophageal reflux disease)    1. Abdominal pain likely from acute pancreatitis other differential include duodenitis -we will keep patient on clear liquids if pain persist may change to n.p.o.  IV fluids pain relief medications.  Patient states she has follow-up with gastroenterologist next week scheduled by her primary care physician but patient does not recall the name exactly.  Continue to follow LFTs.  Because of the pancreatitis is not clear.  Patient does not drink alcohol triglycerides  are not elevated CAT scan does not show any gallstones. 2. Hypothyroidism on Synthroid which we will dose as IV for now. 3. Hypertension on lisinopril hydrochlorothiazide which is on hold and kept patient on PRN IV hydralazine. 4. Possible UTI on ceftriaxone follow urine cultures. 5. History of GERD on Protonix IV.   DVT prophylaxis: Lovenox. Code Status: Full code. Family Communication: Family at the bedside. Disposition Plan: Home. Consults called: None. Admission status: Observation.   Rise Patience MD [facility] Pager [phone].  If 7PM-7AM, please contact night-coverage [email or web address] Password [facility]  11/06/2018, 8:47 PM

## 2018-11-06 NOTE — ED Provider Notes (Signed)
[facility] Provider Note   CSN: [id number] Arrival date & time: 11/06/18  1526   History   Chief Complaint Chief Complaint  Patient presents with  . Abdominal Pain    HPI Erika Cervantes is a 63 y.o. female.  The history is provided by the patient and medical records. No language interpreter was used.  Abdominal Pain  Pain location:  RUQ and epigastric Pain quality: cramping   Pain radiates to:  RUQ and epigastric region Onset quality:  Gradual Duration:  1 week Timing:  Intermittent Progression:  Waxing and waning Chronicity:  New Associated symptoms: nausea, shortness of breath and vomiting   Associated symptoms: no chest pain, no chills, no constipation, no cough, no diarrhea, no dysuria, no fatigue, no fever, no hematemesis, no hematochezia, no hematuria, no sore throat, no vaginal bleeding and no vaginal discharge   Risk factors: no alcohol abuse and no NSAID use     Patient is a 63 year old female with a past medical history of GERD, HTN, and hypothyroidism who presents for evaluation of epigastric and right upper quadrant abdominal pain associated with one episode of emesis yesterday that has been getting progressively worse since 1/4.  Patient states she went to a PCP on 1/10 and was diagnosed with a UTI and started on Bactrim.  She states she has been compliant with this medication.  She denies any cough or chest pain but endorses some shortness of breath.  She states her right upper quadrant pain is exacerbated by eating food.  She denies any earache, vertigo, vision changes, diarrhea, dysuria, gross blood in her urine or stool, rash, or focal extremity weakness numbness or tingling.  She states she developed a headache last night and thinks she may have had chills but denies any fevers.  Denies prior similar episodes.  Denies alleviating or aggravating factors.  Past Medical History:  Diagnosis Date  . Allergy   . GERD  (gastroesophageal reflux disease)   . Hypertension   . Thyroid disease     Patient Active Problem List   Diagnosis Date Noted  . Degenerative arthritis of thumb 09/04/2015  . Vitamin D deficiency 05/26/2012  . Hypothyroidism 02/26/2012  . Hyperlipidemia with target LDL less than 100 02/26/2012  . Allergic rhinitis due to allergen 02/26/2012  . HTN (hypertension) 02/26/2012  . Obesity (BMI 30.0-34.9) 02/26/2012  . Situational anxiety 02/26/2012  . Bulging lumbar disc 02/26/2012  . GERD (gastroesophageal reflux disease) 02/26/2012    Past Surgical History:  Procedure Laterality Date  . ABDOMINAL HYSTERECTOMY     Fibroids  . BREAST SURGERY     Right breast mass  . COLON SURGERY    . Hammer toes    . TONSILLECTOMY       OB History   No obstetric history on file.      Home Medications    Prior to Admission medications   Medication Sig Start Date End Date Taking? Authorizing Provider  albuterol (PROVENTIL HFA;VENTOLIN HFA) 108 (90 Base) MCG/ACT inhaler Inhale 2 puffs into the lungs every 6 (six) hours as needed. Patient not taking: Reported on 01/11/2018 12/07/17   Jaynee Eagles, PA-C  levothyroxine (SYNTHROID, LEVOTHROID) 100 MCG tablet Take 1 tablet (100 mcg total) by mouth daily with breakfast. 10/24/18 11/23/18  Horald Pollen, MD  lisinopril-hydrochlorothiazide (PRINZIDE,ZESTORETIC) 10-12.5 MG tablet Take 1 tablet by mouth daily. 10/24/18 01/22/19  Horald Pollen, MD  omeprazole (PRILOSEC) 20 MG capsule Take 1 capsule (20 mg total) by  mouth daily. 10/24/18   Horald Pollen, MD  ondansetron (ZOFRAN ODT) 4 MG disintegrating tablet Take 1 tablet (4 mg total) by mouth every 8 (eight) hours as needed for nausea or vomiting. 11/04/18   Wendie Agreste, MD  sulfamethoxazole-trimethoprim (BACTRIM DS,SEPTRA DS) 800-160 MG tablet Take 1 tablet by mouth 2 (two) times daily. 11/04/18   Wendie Agreste, MD    Family History Family History  Problem Relation Age of  Onset  . Heart disease Mother     Social History Social History   Tobacco Use  . Smoking status: Former Smoker    Last attempt to quit: 08/22/1997    Years since quitting: 21.2  . Smokeless tobacco: Never Used  Substance Use Topics  . Alcohol use: No  . Drug use: No     Allergies   Darvocet [propoxyphene n-acetaminophen]   Review of Systems Review of Systems  Constitutional: Negative for chills, fatigue and fever.  HENT: Negative for ear pain and sore throat.   Eyes: Negative for pain and visual disturbance.  Respiratory: Positive for shortness of breath. Negative for cough.   Cardiovascular: Negative for chest pain and palpitations.  Gastrointestinal: Positive for abdominal pain, nausea and vomiting. Negative for constipation, diarrhea, hematemesis and hematochezia.  Genitourinary: Negative for dysuria, hematuria, vaginal bleeding and vaginal discharge.  Musculoskeletal: Negative for arthralgias and back pain.  Skin: Negative for color change and rash.  Neurological: Positive for headaches. Negative for seizures and syncope.  All other systems reviewed and are negative.    Physical Exam Updated Vital Signs BP (!) 142/86   Pulse 80   Temp 98.2 F (36.8 C) (Oral)   Resp (!) 23   Ht _0  (1.702 m)   Wt 90.4 kg   SpO2 100%   BMI 31.20 kg/m   Physical Exam Vitals signs and nursing note reviewed.  Constitutional:      General: She is not in acute distress.    Appearance: She is well-developed and normal weight.  HENT:     Head: Normocephalic and atraumatic.     Mouth/Throat:     Mouth: Mucous membranes are moist.  Eyes:     Conjunctiva/sclera: Conjunctivae normal.  Neck:     Musculoskeletal: Neck supple.  Cardiovascular:     Rate and Rhythm: Normal rate and regular rhythm.     Heart sounds: Normal heart sounds. No murmur.  Pulmonary:     Effort: Pulmonary effort is normal. No respiratory distress.     Breath sounds: Normal breath sounds.  Abdominal:       Palpations: Abdomen is soft.     Tenderness: There is abdominal tenderness in the right upper quadrant and epigastric area. There is no right CVA tenderness, left CVA tenderness or guarding. Positive signs include Murphy's sign.  Skin:    General: Skin is warm and dry.     Capillary Refill: Capillary refill takes less than 2 seconds.  Neurological:     General: No focal deficit present.     Mental Status: She is alert.      ED Treatments / Results  Labs (all labs ordered are listed, but only abnormal results are displayed) Labs Reviewed  COMPREHENSIVE METABOLIC PANEL - Abnormal; Notable for the following components:      Result Value   Creatinine, Ser 1.69 (*)    Total Protein 8.3 (*)    GFR calc non Af Amer 32 (*)    GFR calc Af Amer 37 (*)  All other components within normal limits  LIPASE, BLOOD - Abnormal; Notable for the following components:   Lipase 60 (*)    All other components within normal limits  URINALYSIS, ROUTINE W REFLEX MICROSCOPIC - Abnormal; Notable for the following components:   Color, Urine STRAW (*)    Leukocytes, UA LARGE (*)    Bacteria, UA RARE (*)    All other components within normal limits  URINE CULTURE  CBC WITH DIFFERENTIAL/PLATELET  LIPID PANEL    EKG EKG Interpretation  Date/Time:  Sunday November 06 2018 15:49:44 EST Ventricular Rate:  87 PR Interval:    QRS Duration: 95 QT Interval:  359 QTC Calculation: 432 R Axis:   16 Text Interpretation:  Sinus rhythm Low voltage, precordial leads RSR' in V1 or V2, right VCD or RVH No significant change since last tracing Confirmed by Isla Pence [phone]) on 11/06/2018 3:51:56 PM Also confirmed by Isla Pence [phone]), editor Philomena Doheny [phone])  on 11/06/2018 4:56:27 PM   Radiology Dg Chest 2 View  Result Date: 11/06/2018 CLINICAL DATA:  Right-sided chest pain for 1 week.  Smoker. EXAM: CHEST - 2 VIEW COMPARISON:  02/25/2013 FINDINGS: The heart size and mediastinal contours are  within normal limits. Both lungs are clear. The visualized skeletal structures are unremarkable. IMPRESSION: No active cardiopulmonary disease. Electronically Signed   By: Earle Gell M.D.   On: 11/06/2018 16:44   Ct Abdomen Pelvis W Contrast  Result Date: 11/06/2018 CLINICAL DATA:  Epigastric pain radiating to the right side of the back. Nausea and vomiting. EXAM: CT ABDOMEN AND PELVIS WITH CONTRAST TECHNIQUE: Multidetector CT imaging of the abdomen and pelvis was performed using the standard protocol following bolus administration of intravenous contrast. CONTRAST:  50m OMNIPAQUE IOHEXOL 300 MG/ML  SOLN COMPARISON:  04/18/2007. FINDINGS: Lower chest: Unremarkable. Hepatobiliary: 8 mm low-density lesion in the dome of the left liver is too small to characterize but likely a cyst. Liver otherwise unremarkable. There is no evidence for gallstones, gallbladder wall thickening, or pericholecystic fluid. No intrahepatic or extrahepatic biliary dilation. Pancreas: Subtle edema/inflammation is seen around the head of pancreas and uncinate process (axial image 41/series 3). No dilatation of the main pancreatic duct. Fullness noted in the parenchyma of the pancreatic head, presumably edema. Spleen: No splenomegaly. No focal mass lesion. Adrenals/Urinary Tract: No adrenal nodule or mass. Tiny hypodensity in the interpolar right kidney is too small to characterize. Left kidney unremarkable. No evidence for hydroureter. The urinary bladder appears normal for the degree of distention. Stomach/Bowel: Stomach is nondistended. No gastric wall thickening. No evidence of outlet obstruction. There is some edema along the medial wall of the descending duodenum and around the proximal transverse segment. No small bowel wall thickening. No small bowel dilatation. The terminal ileum is normal. The appendix is not visualized, but there is no edema or inflammation in the region of the cecum. No gross colonic mass. No colonic wall  thickening. Vascular/Lymphatic: There is abdominal aortic atherosclerosis without aneurysm. There is no gastrohepatic or hepatoduodenal ligament lymphadenopathy. No intraperitoneal or retroperitoneal lymphadenopathy. No pelvic sidewall lymphadenopathy. Reproductive: Uterus surgically absent.  There is no adnexal mass. Other: No intraperitoneal free fluid. Musculoskeletal: No worrisome lytic or sclerotic osseous abnormality. IMPRESSION: 1. Subtle edema/inflammation around the head of pancreas and uncinate process with fullness in the pancreatic head. Although there is some edema adjacent to the descending and proximal transverse duodenum, pancreas appears to be the epicenter of the changes and acute pancreatitis is favored over duodenitis. Soft tissue  fullness in the head of pancreas likely reflects edema in the parenchyma, but follow-up MRI of the abdomen without and with contrast after resolution of acute symptoms recommended. No dilatation of the main pancreatic duct. No evidence for pancreatic necrosis or pseudocyst at this time. 2.  Aortic Atherosclerois (ICD10-170.0) Electronically Signed   By: Misty Stanley M.D.   On: 11/06/2018 19:18   US Abdomen Limited  Result Date: 11/06/2018 CLINICAL DATA:  Initial evaluation for acute right upper quadrant pain. EXAM: ULTRASOUND ABDOMEN LIMITED RIGHT UPPER QUADRANT COMPARISON:  None. FINDINGS: Gallbladder: No gallstones or wall thickening visualized. No sonographic Murphy sign noted by sonographer. Common bile duct: Diameter: 2.7 mm Liver: No focal lesion identified. Increased echogenicity within the patent parenchyma, suggesting steatosis. Portal vein is patent on color Doppler imaging with normal direction of blood flow towards the liver. IMPRESSION: 1. Normal sonographic appearance of the gallbladder. No cholelithiasis, acute cholecystitis, or biliary dilatation. 2. Mild hepatic steatosis. Electronically Signed   By: Jeannine Boga M.D.   On: 11/06/2018 17:14     Procedures Procedures (including critical care time)  Medications Ordered in ED Medications  sodium chloride 0.9 % bolus 1,000 mL (0 mLs Intravenous Stopped 11/06/18 1800)  morphine 4 MG/ML injection 4 mg (4 mg Intravenous Given 11/06/18 1656)  ondansetron (ZOFRAN) injection 4 mg (4 mg Intravenous Given 11/06/18 1656)  lactated ringers bolus 1,000 mL (1,000 mLs Intravenous New Bag/Given 11/06/18 1853)  iohexol (OMNIPAQUE) 300 MG/ML solution 100 mL (80 mLs Intravenous Contrast Given 11/06/18 1816)  morphine 4 MG/ML injection 4 mg (4 mg Intravenous Given 11/06/18 1944)     Initial Impression / Assessment and Plan / ED Course  I have reviewed the triage vital signs and the nursing notes.  Pertinent labs & imaging results that were available during my care of the patient were reviewed by me and considered in my medical decision making (see chart for details).     Patient is a 63 year old female who presents above-stated history exam complaining of epigastric pain rating to the right upper quadrant and back.  On presentation patient is afebrile stable vital signs.  Exam as above remarkable epigastric and right upper quadrant tenderness without CVA tenderness.  I have low suspicion for ACS given patient denies any chest pain ECG shows a sinus rhythm with a ventricular rate of 87, no significant change compared to prior tracings, normal intervals, normal axis.  Chest x-ray shows no acute findings within the limits of the study in the thorax.  No focal consolidation suggestive of pneumonia, no pleural effusion, pulmonary edema, or other pathology.  Right upper quadrant ultrasound 1. Normal sonographic appearance of the gallbladder. No cholelithiasis, acute cholecystitis, or biliary dilatation. 2. Mild hepatic steatosis.  CMP shows no significant electrolyte or metabolic abnormalities.  Kidney function is mildly decreased with a GFR of 37.  In addition patient's creatinine is noted to be 1.69  which is elevated from that obtained 2 days ago of 1.41.  AST/ALT/alk phos are WNL.  Lipase is 60.  CBC shows a WBC count of 8.8, hemoglobin 12, platelets 293.  Urine shows large leukocyte esterase with rare bacteria.  Urine culture was sent.  Given patient's history of hysterectomy and kidney stones CT abdomen pelvis with contrast was obtained to assess for SBO versus nephrolithiasis vs pancreatitis.  CT A/P  1. Subtle edema/inflammation around the head of pancreas and uncinate process with fullness in the pancreatic head. Although there is some edema adjacent to the descending and proximal  transverse duodenum, pancreas appears to be the epicenter of the changes and acute pancreatitis is favored over duodenitis. Soft tissue fullness in the head of pancreas likely reflects edema in the parenchyma, but follow-up MRI of the abdomen without and with contrast after resolution of acute symptoms recommended. No dilatation of the main pancreatic duct. No evidence for pancreatic necrosis or pseudocyst at this time. 2.  Aortic Atherosclerois (ICD10-170.0)  Patient given IV fluids and IV analgesia emergency department for her abdominal pain.  Patient admitted in stable condition to hospital service for further evaluation management.   Final Clinical Impressions(s) / ED Diagnoses   Final diagnoses:  Acute pancreatitis, unspecified complication status, unspecified pancreatitis type    ED Discharge Orders    None       Hulan Saas, MD 11/06/18 1945    Isla Pence, MD 11/06/18 1950

## 2018-11-06 NOTE — ED Notes (Signed)
Pt ambulated to restroom from hallway bed, tolerated well.

## 2018-11-07 ENCOUNTER — Ambulatory Visit: Payer: Managed Care, Other (non HMO) | Admitting: Family Medicine

## 2018-11-07 ENCOUNTER — Encounter (HOSPITAL_COMMUNITY): Payer: Self-pay | Admitting: Physician Assistant

## 2018-11-07 DIAGNOSIS — I10 Essential (primary) hypertension: Secondary | ICD-10-CM | POA: Diagnosis not present

## 2018-11-07 DIAGNOSIS — R109 Unspecified abdominal pain: Secondary | ICD-10-CM

## 2018-11-07 DIAGNOSIS — E669 Obesity, unspecified: Secondary | ICD-10-CM | POA: Diagnosis not present

## 2018-11-07 DIAGNOSIS — R933 Abnormal findings on diagnostic imaging of other parts of digestive tract: Secondary | ICD-10-CM | POA: Diagnosis not present

## 2018-11-07 LAB — HEPATIC FUNCTION PANEL
ALT: 12 U/L (ref 0–44)
AST: 21 U/L (ref 15–41)
Albumin: 3.2 g/dL — ABNORMAL LOW (ref 3.5–5.0)
Alkaline Phosphatase: 32 U/L — ABNORMAL LOW (ref 38–126)
Bilirubin, Direct: 0.1 mg/dL (ref 0.0–0.2)
Indirect Bilirubin: 1 mg/dL — ABNORMAL HIGH (ref 0.3–0.9)
Total Bilirubin: 1.1 mg/dL (ref 0.3–1.2)
Total Protein: 6.7 g/dL (ref 6.5–8.1)

## 2018-11-07 LAB — CBC
HCT: 32.6 % — ABNORMAL LOW (ref 36.0–46.0)
Hemoglobin: 10.7 g/dL — ABNORMAL LOW (ref 12.0–15.0)
MCH: 27.5 pg (ref 26.0–34.0)
MCHC: 32.8 g/dL (ref 30.0–36.0)
MCV: 83.8 fL (ref 80.0–100.0)
Platelets: 269 10*3/uL (ref 150–400)
RBC: 3.89 MIL/uL (ref 3.87–5.11)
RDW: 14.4 % (ref 11.5–15.5)
WBC: 7.8 10*3/uL (ref 4.0–10.5)
nRBC: 0 % (ref 0.0–0.2)

## 2018-11-07 LAB — URINE CULTURE

## 2018-11-07 LAB — BASIC METABOLIC PANEL
Anion gap: 6 (ref 5–15)
BUN: 10 mg/dL (ref 8–23)
CO2: 24 mmol/L (ref 22–32)
Calcium: 8.7 mg/dL — ABNORMAL LOW (ref 8.9–10.3)
Chloride: 107 mmol/L (ref 98–111)
Creatinine, Ser: 1.5 mg/dL — ABNORMAL HIGH (ref 0.44–1.00)
GFR calc Af Amer: 43 mL/min — ABNORMAL LOW (ref >60)
GFR calc non Af Amer: 37 mL/min — ABNORMAL LOW (ref >60)
Glucose, Bld: 97 mg/dL (ref 70–99)
Potassium: 3.9 mmol/L (ref 3.5–5.1)
Sodium: 137 mmol/L (ref 135–145)

## 2018-11-07 LAB — HIV ANTIBODY (ROUTINE TESTING W REFLEX): HIV Screen 4th Generation wRfx: NONREACTIVE

## 2018-11-07 LAB — TROPONIN I: Troponin I: <0.03 ng/mL (ref <0.03)

## 2018-11-07 LAB — LIPASE, BLOOD: Lipase: 38 U/L (ref 11–51)

## 2018-11-07 MED ORDER — ENOXAPARIN SODIUM 40 MG/0.4ML ~~LOC~~ SOLN: 40.0000 mg | SUBCUTANEOUS | Status: DC

## 2018-11-07 MED ORDER — SODIUM CHLORIDE 0.9 % IV SOLN
1.0000 g | INTRAVENOUS | Status: DC
  Administered 2018-11-07: 1 g via INTRAVENOUS
  Filled 2018-11-07: qty 10

## 2018-11-07 NOTE — Progress Notes (Signed)
Patient arrived to unit via wheel accompanied with her family. Patient alert and oriented x4. Patient oriented to room and unit. Personal belongings and call bell within reach of patient. Bed alarm activated. MD text paged for admitting orders.  Will monitor patient closely.

## 2018-11-07 NOTE — Progress Notes (Signed)
Progress Note    Erika Cervantes  LYY:[id number] DOB: June 16, 1956  DOA: 11/06/2018 PCP: Horald Pollen, MD    Brief Narrative:     Medical records reviewed and are as summarized below:  Erika Cervantes is an 63 y.o. female with history of hypertension, hypothyroidism, GERD presents to the ER because of worsening abdominal pain over the last 1 week.  Patient states the pain is in the epigastric area radiating the back initially had some nausea vomiting.  Denies any diarrhea.  Patient initially gone to her PCP and at that time features were concerning for UTI and was placed on Bactrim.  Despite which patient's pain worsened and came to the ER.  Assessment/Plan:   Principal Problem:   Acute pancreatitis Active Problems:   Hypothyroidism   Hyperlipidemia with target LDL less than 100   HTN (hypertension)   Obesity (BMI 30.0-34.9)   GERD (gastroesophageal reflux disease)   Abdominal pain  -? Duodenitis -CT scan: Subtle edema/inflammation around the head of pancreas and uncinate process with fullness in the pancreatic head. Although there is some edema adjacent to the descending and proximal transverse duodenum, pancreas appears to be the epicenter of the changes and acute pancreatitis is favored over duodenitis. Soft tissue fullness in the head of pancreas likely reflects edema in the parenchyma, but follow-up MRI of the abdomen without and with contrast after resolution of acute symptoms recommended.  GI consult appreciated-- await plan -IVF and pain control  Hypothyroidism  -on Synthroid--- dose as IV for now.  Hypertension  -holding lisinopril/hydrochlorothiazide.  History of GERD  -Protonix IV.  obesity Body mass index is 30.93 kg/m.   Family Communication/Anticipated D/C date and plan/Code Status   DVT prophylaxis: Lovenox ordered. Code Status: Full Code.  Family Communication: daughter Disposition Plan: pending GI work up   Medical Consultants:     GI   Anti-Infectives:    None  Subjective:   Tolerating clears and small amounts  Objective:    Vitals:   11/06/18 2120 11/07/18 0452 11/07/18 0600 11/07/18 1211  BP: (!) 128/58 (!) 108/50  (!) 120/57  Pulse: 81 70  60  Resp: 18   16  Temp: 98.3 F (36.8 C) 97.9 F (36.6 C)  98.5 F (36.9 C)  TempSrc:    Oral  SpO2: 94% 97%  100%  Weight:   89.6 kg   Height:        Intake/Output Summary (Last 24 hours) at 11/07/2018 1440 Last data filed at 11/07/2018 1237 Gross per 24 hour  Intake 3853.92 ml  Output 0 ml  Net 3853.92 ml   Filed Weights   11/06/18 1541 11/07/18 0600  Weight: 90.4 kg 89.6 kg    Exam: In bed, NAD rrr No wheezing, no increased work of breathing +BS, soft A+Ox3  Data Reviewed:   I have personally reviewed following labs and imaging studies:  Labs: Labs show the following:   Basic Metabolic Panel: Recent Labs  Lab 11/04/18 1712 11/06/18 1657 11/06/18 2136 11/07/18 0447  NA 142 138  --  137  K 4.9 4.0  --  3.9  CL 103 105  --  107  CO2 22 24  --  24  GLUCOSE 77 89  --  97  BUN 16 12  --  10  CREATININE 1.41* 1.69* 1.50* 1.50*  CALCIUM 9.8 9.4  --  8.7*   GFR Estimated Creatinine Clearance: 44.7 mL/min (A) (by C-G formula based on SCr of 1.5 mg/dL (H)). Liver  Function Tests: Recent Labs  Lab 11/04/18 1712 11/06/18 1657 11/07/18 0447  AST 21 22 21   ALT 11 13 12   ALKPHOS 49 38 32*  BILITOT 0.3 0.8 1.1  PROT 7.7 8.3* 6.7  ALBUMIN 4.0 3.8 3.2*   Recent Labs  Lab 11/04/18 1712 11/06/18 1657 11/07/18 0626  LIPASE 57 60* 38   No results for input(s): AMMONIA in the last 168 hours. Coagulation profile No results for input(s): INR, PROTIME in the last 168 hours.  CBC: Recent Labs  Lab 11/04/18 1656 11/06/18 1657 11/06/18 2136 11/07/18 0447  WBC 8.9 8.8 9.0 7.8  NEUTROABS  --  5.9  --   --   HGB 12.4 12.0 11.1* 10.7*  HCT 36.2 38.0 34.6* 32.6*  MCV 80.5 83.7 82.8 83.8  PLT  --  293 288 269   Cardiac  Enzymes: Recent Labs  Lab 11/06/18 2136 11/07/18 0626  TROPONINI <0.03 <0.03   BNP (last 3 results) No results for input(s): PROBNP in the last 8760 hours. CBG: No results for input(s): GLUCAP in the last 168 hours. D-Dimer: No results for input(s): DDIMER in the last 72 hours. Hgb A1c: No results for input(s): HGBA1C in the last 72 hours. Lipid Profile: Recent Labs    11/06/18 1657  CHOL 229*  HDL 39*  LDLCALC 153*  TRIG 186*  CHOLHDL 5.9   Thyroid function studies: No results for input(s): TSH, T4TOTAL, T3FREE, THYROIDAB in the last 72 hours.  Invalid input(s): FREET3 Anemia work up: No results for input(s): VITAMINB12, FOLATE, FERRITIN, TIBC, IRON, RETICCTPCT in the last 72 hours. Sepsis Labs: Recent Labs  Lab 11/04/18 1656 11/06/18 1657 11/06/18 2136 11/07/18 0447  WBC 8.9 8.8 9.0 7.8    Microbiology Recent Results (from the past 240 hour(s))  Urine Culture     Status: None   Collection Time: 11/04/18  5:18 PM  Result Value Ref Range Status   Urine Culture, Routine Final report  Final   Organism ID, Bacteria Comment  Final    Comment: Mixed urogenital flora Less than 10,000 colonies/mL     Procedures and diagnostic studies:  Dg Chest 2 View  Result Date: 11/06/2018 CLINICAL DATA:  Right-sided chest pain for 1 week.  Smoker. EXAM: CHEST - 2 VIEW COMPARISON:  02/25/2013 FINDINGS: The heart size and mediastinal contours are within normal limits. Both lungs are clear. The visualized skeletal structures are unremarkable. IMPRESSION: No active cardiopulmonary disease. Electronically Signed   By: Earle Gell M.D.   On: 11/06/2018 16:44   Ct Abdomen Pelvis W Contrast  Result Date: 11/06/2018 CLINICAL DATA:  Epigastric pain radiating to the right side of the back. Nausea and vomiting. EXAM: CT ABDOMEN AND PELVIS WITH CONTRAST TECHNIQUE: Multidetector CT imaging of the abdomen and pelvis was performed using the standard protocol following bolus administration of  intravenous contrast. CONTRAST:  35mL OMNIPAQUE IOHEXOL 300 MG/ML  SOLN COMPARISON:  04/18/2007. FINDINGS: Lower chest: Unremarkable. Hepatobiliary: 8 mm low-density lesion in the dome of the left liver is too small to characterize but likely a cyst. Liver otherwise unremarkable. There is no evidence for gallstones, gallbladder wall thickening, or pericholecystic fluid. No intrahepatic or extrahepatic biliary dilation. Pancreas: Subtle edema/inflammation is seen around the head of pancreas and uncinate process (axial image 41/series 3). No dilatation of the main pancreatic duct. Fullness noted in the parenchyma of the pancreatic head, presumably edema. Spleen: No splenomegaly. No focal mass lesion. Adrenals/Urinary Tract: No adrenal nodule or mass. Tiny hypodensity in the interpolar right  kidney is too small to characterize. Left kidney unremarkable. No evidence for hydroureter. The urinary bladder appears normal for the degree of distention. Stomach/Bowel: Stomach is nondistended. No gastric wall thickening. No evidence of outlet obstruction. There is some edema along the medial wall of the descending duodenum and around the proximal transverse segment. No small bowel wall thickening. No small bowel dilatation. The terminal ileum is normal. The appendix is not visualized, but there is no edema or inflammation in the region of the cecum. No gross colonic mass. No colonic wall thickening. Vascular/Lymphatic: There is abdominal aortic atherosclerosis without aneurysm. There is no gastrohepatic or hepatoduodenal ligament lymphadenopathy. No intraperitoneal or retroperitoneal lymphadenopathy. No pelvic sidewall lymphadenopathy. Reproductive: Uterus surgically absent.  There is no adnexal mass. Other: No intraperitoneal free fluid. Musculoskeletal: No worrisome lytic or sclerotic osseous abnormality. IMPRESSION: 1. Subtle edema/inflammation around the head of pancreas and uncinate process with fullness in the pancreatic  head. Although there is some edema adjacent to the descending and proximal transverse duodenum, pancreas appears to be the epicenter of the changes and acute pancreatitis is favored over duodenitis. Soft tissue fullness in the head of pancreas likely reflects edema in the parenchyma, but follow-up MRI of the abdomen without and with contrast after resolution of acute symptoms recommended. No dilatation of the main pancreatic duct. No evidence for pancreatic necrosis or pseudocyst at this time. 2.  Aortic Atherosclerois (ICD10-170.0) Electronically Signed   By: Misty Stanley M.D.   On: 11/06/2018 19:18   US Abdomen Limited  Result Date: 11/06/2018 CLINICAL DATA:  Initial evaluation for acute right upper quadrant pain. EXAM: ULTRASOUND ABDOMEN LIMITED RIGHT UPPER QUADRANT COMPARISON:  None. FINDINGS: Gallbladder: No gallstones or wall thickening visualized. No sonographic Murphy sign noted by sonographer. Common bile duct: Diameter: 2.7 mm Liver: No focal lesion identified. Increased echogenicity within the patent parenchyma, suggesting steatosis. Portal vein is patent on color Doppler imaging with normal direction of blood flow towards the liver. IMPRESSION: 1. Normal sonographic appearance of the gallbladder. No cholelithiasis, acute cholecystitis, or biliary dilatation. 2. Mild hepatic steatosis. Electronically Signed   By: Jeannine Boga M.D.   On: 11/06/2018 17:14    Medications:   . enoxaparin (LOVENOX) injection  40 mg Subcutaneous Q24H  . levothyroxine  50 mcg Intravenous Daily  . pantoprazole (PROTONIX) IV  40 mg Intravenous Q24H   Continuous Infusions: . lactated ringers 150 mL/hr at 11/07/18 0400     LOS: 0 days   Geradine Girt  [facility]   *Please refer to [email or web address], password [email or web address] to get updated schedule on who will round on this patient, as hospitalists switch teams weekly. If 7PM-7AM, please contact night-coverage at [email or web address], password [email or web address] for any overnight  needs.  11/07/2018, 2:40 PM

## 2018-11-07 NOTE — H&P (View-Only) (Signed)
[facility] Consult: 10:34 AM 11/07/2018  LOS: 0 days    Referring Provider: Dr Eliseo Squires  Primary Care Physician:  Horald Pollen, MD at [facility].    Primary Gastroenterologist: unassigned.   Has office visit scheduled with Dr. Rush Landmark for 11/11/18.      Reason for Consultation:  RUQ pain.     HPI: Erika Cervantes is a 63 y.o. female.  Hx htn.  GERD.  Hypothyroid.  Ureteral stone in 2008.  Fatty liver on ultrasound 2012.  CKD stage 3.  TAH/BSO/appendectomy in the late 1980s, indication for the hysterectomy was fibroids.  Colonoscopy 2010 with Dr. Benson Norway or Collene Mares, had polyps of unknown type.   Patient takes omeprazole 20 mg daily for many years.  Generally does not have reflux symptoms or digestive troubles.  A week ago Saturday, so 10 days ago, she developed pain in the epigastric region kind of in the right upper quadrant which radiated to her back.  It was associated with nausea, early satiety.  She was eating and drinking very little.  No diarrhea.  No change in the color of her stools.  No hematemesis.  She took some limited amount of Tylenol but it did not help.  She has taken ibuprofen in the past, about 200 mg 4-6 times a month. Ada visit to the [facility] urgent care on Friday, she was diagnosed with blood in her urine, UTI and treated with Bactrim. CT of the abdomen and pelvis with contrast shows subtle inflammation in the pancreatic head and uncinate.  Edema in the region of the descending and proximal transverse duodenum.  Pancreas appears to be the epicenter of the changes and acute pancreatitis favored over duodenitis as the diagnosis.  CBD 2.7 mm.   Hepatic steatosis.  Normal PV flow.  Lipase 60.  LFTs normal, other than indirect bilirubin of 1 and normal total bilirubin.   AKI. Normal WBCs.  Hgb 10.7, MCV  83.  Patient does not drink alcohol, never acquired the habit. Family history pertinent for pancreatic issues of unclear type in first cousins and uncles.  1 of her uncles had stomach ulcers.    Past Medical History:  Diagnosis Date  . Allergy   . GERD (gastroesophageal reflux disease)   . Hypertension   . Thyroid disease     Past Surgical History:  Procedure Laterality Date  . ABDOMINAL HYSTERECTOMY  late 1980s   Fibroids were indication.  pt says also had SPO bil.    . APPENDECTOMY  late 1980s   done at time of hysto.    [facility] Kitchen BREAST SURGERY     Right breast mass  . Hammer toes    . TONSILLECTOMY      Prior to Admission medications   Medication Sig Start Date End Date Taking? Authorizing Provider  acetaminophen (TYLENOL) 650 MG CR tablet Take 650 mg by mouth every 8 (eight) hours as needed for pain.   Yes [provider]  levothyroxine (SYNTHROID, LEVOTHROID) 100 MCG tablet Take 1 tablet (100 mcg total) by mouth daily with breakfast. 10/24/18 11/23/18  Yes Sagardia, Ines Bloomer, MD  lisinopril-hydrochlorothiazide (PRINZIDE,ZESTORETIC) 10-12.5 MG tablet Take 1 tablet by mouth daily. 10/24/18 01/22/19 Yes Sagardia, Ines Bloomer, MD  omeprazole (PRILOSEC) 20 MG capsule Take 1 capsule (20 mg total) by mouth daily. 10/24/18  Yes Sagardia, Ines Bloomer, MD  ondansetron (ZOFRAN ODT) 4 MG disintegrating tablet Take 1 tablet (4 mg total) by mouth every 8 (eight) hours as needed for nausea or vomiting. 11/04/18  Yes Wendie Agreste, MD  sulfamethoxazole-trimethoprim (BACTRIM DS,SEPTRA DS) 800-160 MG tablet Take 1 tablet by mouth 2 (two) times daily. 11/04/18  Yes Wendie Agreste, MD  albuterol (PROVENTIL HFA;VENTOLIN HFA) 108 (90 Base) MCG/ACT inhaler Inhale 2 puffs into the lungs every 6 (six) hours as needed. Patient not taking: Reported on 01/11/2018 12/07/17   Jaynee Eagles, PA-C    Scheduled Meds: . enoxaparin (LOVENOX) injection  40 mg Subcutaneous Q24H  . levothyroxine  50 mcg  Intravenous Daily  . pantoprazole (PROTONIX) IV  40 mg Intravenous Q24H   Infusions: . cefTRIAXone (ROCEPHIN)  IV 1 g (11/07/18 0640)  . lactated ringers 150 mL/hr at 11/07/18 0400   PRN Meds: hydrALAZINE, morphine injection, ondansetron **OR** ondansetron (ZOFRAN) IV   Allergies as of 11/06/2018 - Review Complete 11/06/2018  Allergen Reaction Noted  . Darvocet [propoxyphene n-acetaminophen] Nausea And Vomiting 02/23/2012    Family History  Problem Relation Age of Onset  . Heart disease Mother     Social History   Socioeconomic History  . Marital status: Married    Spouse name: Not on file  . Number of children: Not on file  . Years of education: Not on file  . Highest education level: Not on file  Occupational History  . Not on file  Social Needs  . Financial resource strain: Not on file  . Food insecurity:    Worry: Not on file    Inability: Not on file  . Transportation needs:    Medical: Not on file    Non-medical: Not on file  Tobacco Use  . Smoking status: Former Smoker    Last attempt to quit: 08/22/1997    Years since quitting: 21.2  . Smokeless tobacco: Never Used  Substance and Sexual Activity  . Alcohol use: No  . Drug use: No  . Sexual activity: Not on file  Lifestyle  . Physical activity:    Days per week: Not on file    Minutes per session: Not on file  . Stress: Not on file  Relationships  . Social connections:    Talks on phone: Not on file    Gets together: Not on file    Attends religious service: Not on file    Active member of club or organization: Not on file    Attends meetings of clubs or organizations: Not on file    Relationship status: Not on file  . Intimate partner violence:    Fear of current or ex partner: Not on file    Emotionally abused: Not on file    Physically abused: Not on file    Forced sexual activity: Not on file  Other Topics Concern  . Not on file  Social History Narrative  . Not on file    REVIEW OF  SYSTEMS: Constitutional: Feeling tired and weak. ENT:  No nose bleeds Pulm: No difficulty breathing, no productive cough. CV:  No palpitations, no LE edema.  No anginal symptoms. GU:  no frequency GI:  Per HPI Heme: Usual bleeding or bruising. Transfusions: None.  Neuro:  No headaches, no peripheral tingling or numbness Derm:  No itching, no rash or sores.  Endocrine:  No sweats or chills.  No polyuria or dysuria Immunization: Did not ask her about her recent or past vaccinations. Travel:  None beyond local counties in last few months.    PHYSICAL EXAM: Vital signs in last 24 hours: Vitals:   11/06/18 2120 11/07/18 0452  BP: (!) 128/58 (!) 108/50  Pulse: 81 70  Resp: 18   Temp: 98.3 F (36.8 C) 97.9 F (36.6 C)  SpO2: 94% 97%   Wt Readings from Last 3 Encounters:  11/07/18 89.6 kg  11/04/18 90.4 kg  10/24/18 91.5 kg    General: Overweight, pleasant, alert AAF who is sitting up in bed.  Gives a good history. Head: No facial asymmetry or swelling.  No signs of head trauma. Eyes: Not icteric.  Conjunctiva pink.  EOMI. Ears: Not hard of hearing Nose: No congestion or discharge. Mouth: Moist, clear, pink oral mucosa.  Tongue midline. Neck: No JVD, no masses, no thyromegaly. Lungs: Clear bilaterally no labored breathing, no cough. Heart: RRR.  No MRG.  S1, S2 present. Abdomen: Soft.  Minimal upper abdominal tenderness, not distended.  No HSM, masses, bruits, hernias.   Bowel sounds active. Rectal: Deferred Musc/Skeltl: No joint redness, swelling or deformities noted. Extremities: No CCE. Neurologic: Alert.  Oriented x3.  Moves all 4 limbs with full strength.  No tremor. Skin: No rash, no sores, no suspicious lesions. Tattoos: None observed Nodes: No cervical adenopathy Psych: Pleasant, cooperative, calm.  Intake/Output from previous day: 01/12 0701 - 01/13 0700 In: 3253.9 [P.O.:240; I.V.:1013.9; IV Piggyback:2000] Out: -  Intake/Output this shift: Total I/O In: 240  [P.O.:240] Out: -   LAB RESULTS: Recent Labs    11/06/18 1657 11/06/18 2136 11/07/18 0447  WBC 8.8 9.0 7.8  HGB 12.0 11.1* 10.7*  HCT 38.0 34.6* 32.6*  PLT 293 288 269   BMET Lab Results  Component Value Date   NA 137 11/07/2018   NA 138 11/06/2018   NA 142 11/04/2018   K 3.9 11/07/2018   K 4.0 11/06/2018   K 4.9 11/04/2018   CL 107 11/07/2018   CL 105 11/06/2018   CL 103 11/04/2018   CO2 24 11/07/2018   CO2 24 11/06/2018   CO2 22 11/04/2018   GLUCOSE 97 11/07/2018   GLUCOSE 89 11/06/2018   GLUCOSE 77 11/04/2018   BUN 10 11/07/2018   BUN 12 11/06/2018   BUN 16 11/04/2018   CREATININE 1.50 (H) 11/07/2018   CREATININE 1.50 (H) 11/06/2018   CREATININE 1.69 (H) 11/06/2018   CALCIUM 8.7 (L) 11/07/2018   CALCIUM 9.4 11/06/2018   CALCIUM 9.8 11/04/2018   LFT Recent Labs    11/04/18 1712 11/06/18 1657 11/07/18 0447  PROT 7.7 8.3* 6.7  ALBUMIN 4.0 3.8 3.2*  AST 21 22 21   ALT 11 13 12   ALKPHOS 49 38 32*  BILITOT 0.3 0.8 1.1  BILIDIR  --   --  0.1  IBILI  --   --  1.0*   PT/INR No results found for: INR, PROTIME Hepatitis Panel No results for input(s): HEPBSAG, HCVAB, HEPAIGM, HEPBIGM in the last 72 hours. C-Diff No components found for: CDIFF Lipase     Component Value Date/Time   LIPASE 38 11/07/2018 0626    Drugs of Abuse  No results found for: LABOPIA, COCAINSCRNUR, LABBENZ, AMPHETMU, THCU, LABBARB   RADIOLOGY STUDIES: Dg Chest 2 View  Result Date: 11/06/2018 CLINICAL  DATA:  Right-sided chest pain for 1 week.  Smoker. EXAM: CHEST - 2 VIEW COMPARISON:  02/25/2013 FINDINGS: The heart size and mediastinal contours are within normal limits. Both lungs are clear. The visualized skeletal structures are unremarkable. IMPRESSION: No active cardiopulmonary disease. Electronically Signed   By: Earle Gell M.D.   On: 11/06/2018 16:44   Ct Abdomen Pelvis W Contrast  Result Date: 11/06/2018 CLINICAL DATA:  Epigastric pain radiating to the right side of the  back. Nausea and vomiting. EXAM: CT ABDOMEN AND PELVIS WITH CONTRAST TECHNIQUE: Multidetector CT imaging of the abdomen and pelvis was performed using the standard protocol following bolus administration of intravenous contrast. CONTRAST:  78mL OMNIPAQUE IOHEXOL 300 MG/ML  SOLN COMPARISON:  04/18/2007. FINDINGS: Lower chest: Unremarkable. Hepatobiliary: 8 mm low-density lesion in the dome of the left liver is too small to characterize but likely a cyst. Liver otherwise unremarkable. There is no evidence for gallstones, gallbladder wall thickening, or pericholecystic fluid. No intrahepatic or extrahepatic biliary dilation. Pancreas: Subtle edema/inflammation is seen around the head of pancreas and uncinate process (axial image 41/series 3). No dilatation of the main pancreatic duct. Fullness noted in the parenchyma of the pancreatic head, presumably edema. Spleen: No splenomegaly. No focal mass lesion. Adrenals/Urinary Tract: No adrenal nodule or mass. Tiny hypodensity in the interpolar right kidney is too small to characterize. Left kidney unremarkable. No evidence for hydroureter. The urinary bladder appears normal for the degree of distention. Stomach/Bowel: Stomach is nondistended. No gastric wall thickening. No evidence of outlet obstruction. There is some edema along the medial wall of the descending duodenum and around the proximal transverse segment. No small bowel wall thickening. No small bowel dilatation. The terminal ileum is normal. The appendix is not visualized, but there is no edema or inflammation in the region of the cecum. No gross colonic mass. No colonic wall thickening. Vascular/Lymphatic: There is abdominal aortic atherosclerosis without aneurysm. There is no gastrohepatic or hepatoduodenal ligament lymphadenopathy. No intraperitoneal or retroperitoneal lymphadenopathy. No pelvic sidewall lymphadenopathy. Reproductive: Uterus surgically absent.  There is no adnexal mass. Other: No  intraperitoneal free fluid. Musculoskeletal: No worrisome lytic or sclerotic osseous abnormality. IMPRESSION: 1. Subtle edema/inflammation around the head of pancreas and uncinate process with fullness in the pancreatic head. Although there is some edema adjacent to the descending and proximal transverse duodenum, pancreas appears to be the epicenter of the changes and acute pancreatitis is favored over duodenitis. Soft tissue fullness in the head of pancreas likely reflects edema in the parenchyma, but follow-up MRI of the abdomen without and with contrast after resolution of acute symptoms recommended. No dilatation of the main pancreatic duct. No evidence for pancreatic necrosis or pseudocyst at this time. 2.  Aortic Atherosclerois (ICD10-170.0) Electronically Signed   By: Misty Stanley M.D.   On: 11/06/2018 19:18   US Abdomen Limited  Result Date: 11/06/2018 CLINICAL DATA:  Initial evaluation for acute right upper quadrant pain. EXAM: ULTRASOUND ABDOMEN LIMITED RIGHT UPPER QUADRANT COMPARISON:  None. FINDINGS: Gallbladder: No gallstones or wall thickening visualized. No sonographic Murphy sign noted by sonographer. Common bile duct: Diameter: 2.7 mm Liver: No focal lesion identified. Increased echogenicity within the patent parenchyma, suggesting steatosis. Portal vein is patent on color Doppler imaging with normal direction of blood flow towards the liver. IMPRESSION: 1. Normal sonographic appearance of the gallbladder. No cholelithiasis, acute cholecystitis, or biliary dilatation. 2. Mild hepatic steatosis. Electronically Signed   By: Jeannine Boga M.D.   On: 11/06/2018 17:14  IMPRESSION:   *    Acute pancreatitis, question if this is primary pancreatitis or pancreatitis secondary to duodenitis. Does not drink alcohol.  No Cholelithiasis on ultrasound or CT.  Takes low-dose omeprazole daily.  Currently on Protonix 40 mg IV every 24 hour.  *   Diagnosed with UTI and treated with Septra as  of 11/04/2018.  However the urinalysis is soft for infection and urine culture growing less than 10 K of mixed urogenital flora so she does not appear to have a UTI.  She is receiving Rocephin for this however.  *    Remote, 10 years ago, history colon polyps of unclear type.  No pathology or procedural records found in epic.  This was done at Dr. Nelva Nay office.     *   Normocytic anemia post hydration.     *   AKI.  Baseline CKD stage 3a in 12/2017.     PLAN:     *   Dr. Havery Moros will be seeing the patient this morning, she may require an EGD to rule out duodenitis  *   Continue Protonix.  Stopped Rocephin.  Continue to allow clear liquids as desired.  LR running at 150/hour.  bolused 1 liter NS and 1 liter LR yesterday afternoon.      Azucena Freed  11/07/2018, 10:34 AM Phone [phone]    ________________________________________________________________________  Velora Heckler GI MD note:  I personally examined the patient, reviewed the data and agree with the assessment and plan described above.  Planning on EGD tomorrow, unclear etiology of her pain or findings on the CT scan.  Mild pancreatitis (no stones in GB and non-Etoh abuser) vs. Peptic duodenitis  Vs. Early neoplasm.   Owens Loffler, MD [facility] Pager [phone]

## 2018-11-07 NOTE — Consult Note (Addendum)
[facility] Consult: 10:34 AM 11/07/2018  LOS: 0 days    Referring Provider: Dr Eliseo Squires  Primary Care Physician:  Horald Pollen, MD at [facility]    Primary Gastroenterologist: unassigned.   Has office visit scheduled with Dr. Rush Landmark for 11/11/18.      Reason for Consultation:  RUQ pain.     HPI: Erika Cervantes is a 63 y.o. female.  Hx htn.  GERD.  Hypothyroid.  Ureteral stone in 2008.  Fatty liver on ultrasound 2012.  CKD stage 3.  TAH/BSO/appendectomy in the late 1980s, indication for the hysterectomy was fibroids.  Colonoscopy 2010 with Dr. Benson Norway or Collene Mares, had polyps of unknown type.   Patient takes omeprazole 20 mg daily for many years.  Generally does not have reflux symptoms or digestive troubles.  A week ago Saturday, so 10 days ago, she developed pain in the epigastric region kind of in the right upper quadrant which radiated to her back.  It was associated with nausea, early satiety.  She was eating and drinking very little.  No diarrhea.  No change in the color of her stools.  No hematemesis.  She took some limited amount of Tylenol but it did not help.  She has taken ibuprofen in the past, about 200 mg 4-6 times a month. Ada visit to the [place] urgent care on Friday, she was diagnosed with blood in her urine, UTI and treated with Bactrim. CT of the abdomen and pelvis with contrast shows subtle inflammation in the pancreatic head and uncinate.  Edema in the region of the descending and proximal transverse duodenum.  Pancreas appears to be the epicenter of the changes and acute pancreatitis favored over duodenitis as the diagnosis.  CBD 2.7 mm.   Hepatic steatosis.  Normal PV flow.  Lipase 60.  LFTs normal, other than indirect bilirubin of 1 and normal total bilirubin.   AKI. Normal WBCs.  Hgb 10.7, MCV  83.  Patient does not drink alcohol, never acquired the habit. Family history pertinent for pancreatic issues of unclear type in first cousins and uncles.  1 of her uncles had stomach ulcers.    Past Medical History:  Diagnosis Date  . Allergy   . GERD (gastroesophageal reflux disease)   . Hypertension   . Thyroid disease     Past Surgical History:  Procedure Laterality Date  . ABDOMINAL HYSTERECTOMY  late 1980s   Fibroids were indication.  pt says also had SPO bil.    . APPENDECTOMY  late 1980s   done at time of hysto.    [facility] Kitchen BREAST SURGERY     Right breast mass  . Hammer toes    . TONSILLECTOMY      Prior to Admission medications   Medication Sig Start Date End Date Taking? Authorizing Provider  acetaminophen (TYLENOL) 650 MG CR tablet Take 650 mg by mouth every 8 (eight) hours as needed for pain.   Yes [provider]  levothyroxine (SYNTHROID, LEVOTHROID) 100 MCG tablet Take 1 tablet (100 mcg total) by mouth daily with breakfast. 10/24/18 11/23/18  Yes Sagardia, Ines Bloomer, MD  lisinopril-hydrochlorothiazide (PRINZIDE,ZESTORETIC) 10-12.5 MG tablet Take 1 tablet by mouth daily. 10/24/18 01/22/19 Yes Sagardia, Ines Bloomer, MD  omeprazole (PRILOSEC) 20 MG capsule Take 1 capsule (20 mg total) by mouth daily. 10/24/18  Yes Sagardia, Ines Bloomer, MD  ondansetron (ZOFRAN ODT) 4 MG disintegrating tablet Take 1 tablet (4 mg total) by mouth every 8 (eight) hours as needed for nausea or vomiting. 11/04/18  Yes Wendie Agreste, MD  sulfamethoxazole-trimethoprim (BACTRIM DS,SEPTRA DS) 800-160 MG tablet Take 1 tablet by mouth 2 (two) times daily. 11/04/18  Yes Wendie Agreste, MD  albuterol (PROVENTIL HFA;VENTOLIN HFA) 108 (90 Base) MCG/ACT inhaler Inhale 2 puffs into the lungs every 6 (six) hours as needed. Patient not taking: Reported on 01/11/2018 12/07/17   Jaynee Eagles, PA-C    Scheduled Meds: . enoxaparin (LOVENOX) injection  40 mg Subcutaneous Q24H  . levothyroxine  50 mcg  Intravenous Daily  . pantoprazole (PROTONIX) IV  40 mg Intravenous Q24H   Infusions: . cefTRIAXone (ROCEPHIN)  IV 1 g (11/07/18 0640)  . lactated ringers 150 mL/hr at 11/07/18 0400   PRN Meds: hydrALAZINE, morphine injection, ondansetron **OR** ondansetron (ZOFRAN) IV   Allergies as of 11/06/2018 - Review Complete 11/06/2018  Allergen Reaction Noted  . Darvocet [propoxyphene n-acetaminophen] Nausea And Vomiting 02/23/2012    Family History  Problem Relation Age of Onset  . Heart disease Mother     Social History   Socioeconomic History  . Marital status: Married    Spouse name: Not on file  . Number of children: Not on file  . Years of education: Not on file  . Highest education level: Not on file  Occupational History  . Not on file  Social Needs  . Financial resource strain: Not on file  . Food insecurity:    Worry: Not on file    Inability: Not on file  . Transportation needs:    Medical: Not on file    Non-medical: Not on file  Tobacco Use  . Smoking status: Former Smoker    Last attempt to quit: 08/22/1997    Years since quitting: 21.2  . Smokeless tobacco: Never Used  Substance and Sexual Activity  . Alcohol use: No  . Drug use: No  . Sexual activity: Not on file  Lifestyle  . Physical activity:    Days per week: Not on file    Minutes per session: Not on file  . Stress: Not on file  Relationships  . Social connections:    Talks on phone: Not on file    Gets together: Not on file    Attends religious service: Not on file    Active member of club or organization: Not on file    Attends meetings of clubs or organizations: Not on file    Relationship status: Not on file  . Intimate partner violence:    Fear of current or ex partner: Not on file    Emotionally abused: Not on file    Physically abused: Not on file    Forced sexual activity: Not on file  Other Topics Concern  . Not on file  Social History Narrative  . Not on file    REVIEW OF  SYSTEMS: Constitutional: Feeling tired and weak. ENT:  No nose bleeds Pulm: No difficulty breathing, no productive cough. CV:  No palpitations, no LE edema.  No anginal symptoms. GU:  no frequency GI:  Per HPI Heme: Usual bleeding or bruising. Transfusions: None.  Neuro:  No headaches, no peripheral tingling or numbness Derm:  No itching, no rash or sores.  Endocrine:  No sweats or chills.  No polyuria or dysuria Immunization: Did not ask her about her recent or past vaccinations. Travel:  None beyond local counties in last few months.    PHYSICAL EXAM: Vital signs in last 24 hours: Vitals:   11/06/18 2120 11/07/18 0452  BP: (!) 128/58 (!) 108/50  Pulse: 81 70  Resp: 18   Temp: 98.3 F (36.8 C) 97.9 F (36.6 C)  SpO2: 94% 97%   Wt Readings from Last 3 Encounters:  11/07/18 89.6 kg  11/04/18 90.4 kg  10/24/18 91.5 kg    General: Overweight, pleasant, alert AAF who is sitting up in bed.  Gives a good history. Head: No facial asymmetry or swelling.  No signs of head trauma. Eyes: Not icteric.  Conjunctiva pink.  EOMI. Ears: Not hard of hearing Nose: No congestion or discharge. Mouth: Moist, clear, pink oral mucosa.  Tongue midline. Neck: No JVD, no masses, no thyromegaly. Lungs: Clear bilaterally no labored breathing, no cough. Heart: RRR.  No MRG.  S1, S2 present. Abdomen: Soft.  Minimal upper abdominal tenderness, not distended.  No HSM, masses, bruits, hernias.   Bowel sounds active. Rectal: Deferred Musc/Skeltl: No joint redness, swelling or deformities noted. Extremities: No CCE. Neurologic: Alert.  Oriented x3.  Moves all 4 limbs with full strength.  No tremor. Skin: No rash, no sores, no suspicious lesions. Tattoos: None observed Nodes: No cervical adenopathy Psych: Pleasant, cooperative, calm.  Intake/Output from previous day: 01/12 0701 - 01/13 0700 In: 3253.9 [P.O.:240; I.V.:1013.9; IV Piggyback:2000] Out: -  Intake/Output this shift: Total I/O In: 240  [P.O.:240] Out: -   LAB RESULTS: Recent Labs    11/06/18 1657 11/06/18 2136 11/07/18 0447  WBC 8.8 9.0 7.8  HGB 12.0 11.1* 10.7*  HCT 38.0 34.6* 32.6*  PLT 293 288 269   BMET Lab Results  Component Value Date   NA 137 11/07/2018   NA 138 11/06/2018   NA 142 11/04/2018   K 3.9 11/07/2018   K 4.0 11/06/2018   K 4.9 11/04/2018   CL 107 11/07/2018   CL 105 11/06/2018   CL 103 11/04/2018   CO2 24 11/07/2018   CO2 24 11/06/2018   CO2 22 11/04/2018   GLUCOSE 97 11/07/2018   GLUCOSE 89 11/06/2018   GLUCOSE 77 11/04/2018   BUN 10 11/07/2018   BUN 12 11/06/2018   BUN 16 11/04/2018   CREATININE 1.50 (H) 11/07/2018   CREATININE 1.50 (H) 11/06/2018   CREATININE 1.69 (H) 11/06/2018   CALCIUM 8.7 (L) 11/07/2018   CALCIUM 9.4 11/06/2018   CALCIUM 9.8 11/04/2018   LFT Recent Labs    11/04/18 1712 11/06/18 1657 11/07/18 0447  PROT 7.7 8.3* 6.7  ALBUMIN 4.0 3.8 3.2*  AST 21 22 21   ALT 11 13 12   ALKPHOS 49 38 32*  BILITOT 0.3 0.8 1.1  BILIDIR  --   --  0.1  IBILI  --   --  1.0*   PT/INR No results found for: INR, PROTIME Hepatitis Panel No results for input(s): HEPBSAG, HCVAB, HEPAIGM, HEPBIGM in the last 72 hours. C-Diff No components found for: CDIFF Lipase     Component Value Date/Time   LIPASE 38 11/07/2018 0626    Drugs of Abuse  No results found for: LABOPIA, COCAINSCRNUR, LABBENZ, AMPHETMU, THCU, LABBARB   RADIOLOGY STUDIES: Dg Chest 2 View  Result Date: 11/06/2018 CLINICAL  DATA:  Right-sided chest pain for 1 week.  Smoker. EXAM: CHEST - 2 VIEW COMPARISON:  02/25/2013 FINDINGS: The heart size and mediastinal contours are within normal limits. Both lungs are clear. The visualized skeletal structures are unremarkable. IMPRESSION: No active cardiopulmonary disease. Electronically Signed   By: Earle Gell M.D.   On: 11/06/2018 16:44   Ct Abdomen Pelvis W Contrast  Result Date: 11/06/2018 CLINICAL DATA:  Epigastric pain radiating to the right side of the  back. Nausea and vomiting. EXAM: CT ABDOMEN AND PELVIS WITH CONTRAST TECHNIQUE: Multidetector CT imaging of the abdomen and pelvis was performed using the standard protocol following bolus administration of intravenous contrast. CONTRAST:  50mL OMNIPAQUE IOHEXOL 300 MG/ML  SOLN COMPARISON:  04/18/2007. FINDINGS: Lower chest: Unremarkable. Hepatobiliary: 8 mm low-density lesion in the dome of the left liver is too small to characterize but likely a cyst. Liver otherwise unremarkable. There is no evidence for gallstones, gallbladder wall thickening, or pericholecystic fluid. No intrahepatic or extrahepatic biliary dilation. Pancreas: Subtle edema/inflammation is seen around the head of pancreas and uncinate process (axial image 41/series 3). No dilatation of the main pancreatic duct. Fullness noted in the parenchyma of the pancreatic head, presumably edema. Spleen: No splenomegaly. No focal mass lesion. Adrenals/Urinary Tract: No adrenal nodule or mass. Tiny hypodensity in the interpolar right kidney is too small to characterize. Left kidney unremarkable. No evidence for hydroureter. The urinary bladder appears normal for the degree of distention. Stomach/Bowel: Stomach is nondistended. No gastric wall thickening. No evidence of outlet obstruction. There is some edema along the medial wall of the descending duodenum and around the proximal transverse segment. No small bowel wall thickening. No small bowel dilatation. The terminal ileum is normal. The appendix is not visualized, but there is no edema or inflammation in the region of the cecum. No gross colonic mass. No colonic wall thickening. Vascular/Lymphatic: There is abdominal aortic atherosclerosis without aneurysm. There is no gastrohepatic or hepatoduodenal ligament lymphadenopathy. No intraperitoneal or retroperitoneal lymphadenopathy. No pelvic sidewall lymphadenopathy. Reproductive: Uterus surgically absent.  There is no adnexal mass. Other: No  intraperitoneal free fluid. Musculoskeletal: No worrisome lytic or sclerotic osseous abnormality. IMPRESSION: 1. Subtle edema/inflammation around the head of pancreas and uncinate process with fullness in the pancreatic head. Although there is some edema adjacent to the descending and proximal transverse duodenum, pancreas appears to be the epicenter of the changes and acute pancreatitis is favored over duodenitis. Soft tissue fullness in the head of pancreas likely reflects edema in the parenchyma, but follow-up MRI of the abdomen without and with contrast after resolution of acute symptoms recommended. No dilatation of the main pancreatic duct. No evidence for pancreatic necrosis or pseudocyst at this time. 2.  Aortic Atherosclerois (ICD10-170.0) Electronically Signed   By: Misty Stanley M.D.   On: 11/06/2018 19:18   US Abdomen Limited  Result Date: 11/06/2018 CLINICAL DATA:  Initial evaluation for acute right upper quadrant pain. EXAM: ULTRASOUND ABDOMEN LIMITED RIGHT UPPER QUADRANT COMPARISON:  None. FINDINGS: Gallbladder: No gallstones or wall thickening visualized. No sonographic Murphy sign noted by sonographer. Common bile duct: Diameter: 2.7 mm Liver: No focal lesion identified. Increased echogenicity within the patent parenchyma, suggesting steatosis. Portal vein is patent on color Doppler imaging with normal direction of blood flow towards the liver. IMPRESSION: 1. Normal sonographic appearance of the gallbladder. No cholelithiasis, acute cholecystitis, or biliary dilatation. 2. Mild hepatic steatosis. Electronically Signed   By: Jeannine Boga M.D.   On: 11/06/2018 17:14  IMPRESSION:   *    Acute pancreatitis, question if this is primary pancreatitis or pancreatitis secondary to duodenitis. Does not drink alcohol.  No Cholelithiasis on ultrasound or CT.  Takes low-dose omeprazole daily.  Currently on Protonix 40 mg IV every 24 hour.  *   Diagnosed with UTI and treated with Septra as  of 11/04/2018.  However the urinalysis is soft for infection and urine culture growing less than 10 K of mixed urogenital flora so she does not appear to have a UTI.  She is receiving Rocephin for this however.  *    Remote, 10 years ago, history colon polyps of unclear type.  No pathology or procedural records found in epic.  This was done at Dr. Nelva Nay office.     *   Normocytic anemia post hydration.     *   AKI.  Baseline CKD stage 3a in 12/2017.     PLAN:     *   Dr. Havery Moros will be seeing the patient this morning, she may require an EGD to rule out duodenitis  *   Continue Protonix.  Stopped Rocephin.  Continue to allow clear liquids as desired.  LR running at 150/hour.  bolused 1 liter NS and 1 liter LR yesterday afternoon.      Azucena Freed  11/07/2018, 10:34 AM Phone [phone]    ________________________________________________________________________  Velora Heckler GI MD note:  I personally examined the patient, reviewed the data and agree with the assessment and plan described above.  Planning on EGD tomorrow, unclear etiology of her pain or findings on the CT scan.  Mild pancreatitis (no stones in GB and non-Etoh abuser) vs. Peptic duodenitis  Vs. Early neoplasm.   Owens Loffler, MD [facility] Pager [phone]

## 2018-11-08 ENCOUNTER — Encounter (HOSPITAL_COMMUNITY): Payer: Self-pay | Admitting: Certified Registered"

## 2018-11-08 ENCOUNTER — Encounter (HOSPITAL_COMMUNITY)
Admission: EM | Disposition: A | Discharge status: Home / Self Care | Payer: Self-pay | Source: Home / Self Care | Attending: Emergency Medicine

## 2018-11-08 ENCOUNTER — Observation Stay (HOSPITAL_COMMUNITY): Payer: Managed Care, Other (non HMO) | Admitting: Anesthesiology

## 2018-11-08 DIAGNOSIS — R1013 Epigastric pain: Secondary | ICD-10-CM | POA: Diagnosis not present

## 2018-11-08 DIAGNOSIS — K859 Acute pancreatitis without necrosis or infection, unspecified: Secondary | ICD-10-CM | POA: Diagnosis not present

## 2018-11-08 DIAGNOSIS — E039 Hypothyroidism, unspecified: Secondary | ICD-10-CM | POA: Diagnosis not present

## 2018-11-08 DIAGNOSIS — I10 Essential (primary) hypertension: Secondary | ICD-10-CM | POA: Diagnosis not present

## 2018-11-08 DIAGNOSIS — E669 Obesity, unspecified: Secondary | ICD-10-CM | POA: Diagnosis not present

## 2018-11-08 HISTORY — PX: ESOPHAGOGASTRODUODENOSCOPY (EGD) WITH PROPOFOL: SHX5813

## 2018-11-08 LAB — RAPID URINE DRUG SCREEN, HOSP PERFORMED
Amphetamines: NOT DETECTED
Barbiturates: NOT DETECTED
Benzodiazepines: NOT DETECTED
Cocaine: NOT DETECTED
Opiates: POSITIVE — AB
Tetrahydrocannabinol: NOT DETECTED

## 2018-11-08 SURGERY — ESOPHAGOGASTRODUODENOSCOPY (EGD) WITH PROPOFOL
Anesthesia: Monitor Anesthesia Care

## 2018-11-08 MED ORDER — PROPOFOL 500 MG/50ML IV EMUL
INTRAVENOUS | Status: DC | PRN
  Administered 2018-11-08: 100 ug/kg/min via INTRAVENOUS

## 2018-11-08 MED ORDER — GLYCOPYRROLATE 0.2 MG/ML IJ SOLN
INTRAMUSCULAR | Status: DC | PRN
  Administered 2018-11-08 (×2): 0.1 mg via INTRAVENOUS

## 2018-11-08 MED ORDER — LACTATED RINGERS IV SOLN
INTRAVENOUS | Status: DC | PRN
  Administered 2018-11-08: 10:00:00 via INTRAVENOUS

## 2018-11-08 MED ORDER — LIDOCAINE HCL (PF) 2 % IJ SOLN
INTRAMUSCULAR | Status: DC | PRN
  Administered 2018-11-08: 60 mg via INTRADERMAL

## 2018-11-08 MED ORDER — PROPOFOL 10 MG/ML IV BOLUS
INTRAVENOUS | Status: DC | PRN
  Administered 2018-11-08 (×2): 20 mg via INTRAVENOUS

## 2018-11-08 SURGICAL SUPPLY — 15 items

## 2018-11-08 NOTE — Anesthesia Postprocedure Evaluation (Signed)
Anesthesia Post Note  Patient: Erika Cervantes  Procedure(s) Performed: ESOPHAGOGASTRODUODENOSCOPY (EGD) WITH PROPOFOL (N/A )     Patient location during evaluation: Endoscopy Anesthesia Type: MAC Level of consciousness: awake and alert Pain management: pain level controlled Vital Signs Assessment: post-procedure vital signs reviewed and stable Respiratory status: spontaneous breathing, nonlabored ventilation, respiratory function stable and patient connected to nasal cannula oxygen Cardiovascular status: stable and blood pressure returned to baseline Postop Assessment: no apparent nausea or vomiting Anesthetic complications: no    Last Vitals:  Vitals:   11/08/18 1050 11/08/18 1052  BP:  (!) 143/54  Pulse: 87 84  Resp: 18 18  Temp:    SpO2: 100% 100%    Last Pain:  Vitals:   11/08/18 1052  TempSrc:   PainSc: 0-No pain                 Barnet Glasgow

## 2018-11-08 NOTE — Anesthesia Preprocedure Evaluation (Addendum)
Anesthesia Evaluation    Reviewed: Allergy & Precautions, H&P , Patient's Chart, lab work & pertinent test results  History of Anesthesia Complications (+) PONV  Airway Mallampati: II  TM Distance: >3 FB Neck ROM: Full    Dental no notable dental hx. (+) Teeth Intact, Dental Advisory Given   Pulmonary former smoker,    Pulmonary exam normal breath sounds clear to auscultation       Cardiovascular hypertension, Normal cardiovascular exam Rhythm:Regular Rate:Normal     Neuro/Psych Anxiety negative neurological ROS     GI/Hepatic GERD  ,  Endo/Other  Hypothyroidism   Renal/GU negative Renal ROS     Musculoskeletal   Abdominal (+) + obese,   Peds  Hematology negative hematology ROS (+)   Anesthesia Other Findings   Reproductive/Obstetrics                            Anesthesia Physical Anesthesia Plan  ASA: III  Anesthesia Plan: MAC   Post-op Pain Management:    Induction: Intravenous  PONV Risk Score and Plan: Treatment may vary due to age or medical condition  Airway Management Planned: Natural Airway and Nasal Cannula  Additional Equipment:   Intra-op Plan:   Post-operative Plan:   Informed Consent: I have reviewed the patients History and Physical, chart, labs and discussed the procedure including the risks, benefits and alternatives for the proposed anesthesia with the patient or authorized representative who has indicated his/her understanding and acceptance.   Dental advisory given  Plan Discussed with: CRNA  Anesthesia Plan Comments:        Anesthesia Quick Evaluation

## 2018-11-08 NOTE — Discharge Summary (Signed)
Physician Discharge Summary  [name] QBH:[id number] DOB: 1956-03-04 DOA: 11/06/2018  PCP: Horald Pollen, MD  Admit date: 11/06/2018 Discharge date: 11/08/2018  Admitted From: home Discharge disposition: home   Recommendations for Outpatient Follow-Up:   1. Outpatient GI follow up for MRI of pancreas   Discharge Diagnosis:   Principal Problem:   Acute pancreatitis Active Problems:   Hypothyroidism   Hyperlipidemia with target LDL less than 100   HTN (hypertension)   Obesity (BMI 30.0-34.9)   GERD (gastroesophageal reflux disease)    Discharge Condition: Improved.  Diet recommendation: bland low salt diet Wound care: None.  Code status: Full.   History of Present Illness:   Erika Cervantes is a 63 y.o. female with history of hypertension, hypothyroidism, GERD presents to the ER because of worsening abdominal pain over the last 1 week.  Patient states the pain is in the epigastric area radiating the back initially had some nausea vomiting.  Denies any diarrhea.  Patient initially gone to her PCP and at that time features were concerning for UTI and was placed on Bactrim.  Despite which patient's pain worsened and came to the ER.  Patient denies drinking alcohol.  Has not had any previous history of pancreatitis.  Has not been recently started on any new medications except for the antibiotics.   Hospital Course by Problem:   Abdominal pain -- resolved -? Duodenitis vs pancreatitis- lipase marginally elevated -CT scan: Subtle edema/inflammation around the head of pancreas and uncinate process with fullness in the pancreatic head. Although there is some edema adjacent to the descending and proximal transverse duodenum, pancreas appears to be the epicenter of the changes and acute pancreatitis is favored over duodenitis. Soft tissue fullness in the head of pancreas likely reflects edema in the parenchyma, but follow-up MRI of the abdomen without and  with contrast after resolution of acute symptoms recommended.  GI consult appreciated--- s/p EGD: Normal UGI tract.       - Return patient to hospital ward for ongoing care.                           - Empiric treatment for her abdominal pains. Will advance diet today.                           - OK for d/c when tolerating diet and pain improved.                           - [facility] will arrange office visit in 4-5 weeks  with MRI of pancreas to check for interval                            resolution of the changes noted on recent CT.  Hypothyroidism  -resume home meds  Hypertension  -resume lisinopril/hydrochlorothiazide-- may need a different agent  History of GERD  -PPI  obesity Body mass index is 30.93 kg/m.  CKD stage III -outpatient follow up    Medical Consultants:    GI  Discharge Exam:   Vitals:   11/08/18 1052 11/08/18 1222  BP: (!) 143/54 (!) 137/51  Pulse: 84 72  Resp: 18 16  Temp:  98 F (36.7 C)  SpO2: 100% 100%   Vitals:   11/08/18 1040 11/08/18 1050 11/08/18 1052 11/08/18 1222  BP:   (!) 143/54 (!) 137/51  Pulse:  87 84 72  Resp: (!) 30 18 18 16   Temp:    98 F (36.7 C)  TempSrc:    Oral  SpO2:  100% 100% 100%  Weight:      Height:        General exam: Appears calm and comfortable- no pain, eating well  The results of significant diagnostics from this hospitalization (including imaging, microbiology, ancillary and laboratory) are listed below for reference.     Procedures and Diagnostic Studies:   Dg Chest 2 View  Result Date: 11/06/2018 CLINICAL DATA:  Right-sided chest pain for 1 week.  Smoker. EXAM: CHEST - 2 VIEW COMPARISON:  02/25/2013 FINDINGS: The heart size and mediastinal contours are within normal limits. Both lungs are clear. The visualized skeletal structures are unremarkable. IMPRESSION: No active cardiopulmonary disease. Electronically Signed   By: Earle Gell M.D.   On: 11/06/2018 16:44   Ct Abdomen Pelvis W  Contrast  Result Date: 11/06/2018 CLINICAL DATA:  Epigastric pain radiating to the right side of the back. Nausea and vomiting. EXAM: CT ABDOMEN AND PELVIS WITH CONTRAST TECHNIQUE: Multidetector CT imaging of the abdomen and pelvis was performed using the standard protocol following bolus administration of intravenous contrast. CONTRAST:  13mL OMNIPAQUE IOHEXOL 300 MG/ML  SOLN COMPARISON:  04/18/2007. FINDINGS: Lower chest: Unremarkable. Hepatobiliary: 8 mm low-density lesion in the dome of the left liver is too small to characterize but likely a cyst. Liver otherwise unremarkable. There is no evidence for gallstones, gallbladder wall thickening, or pericholecystic fluid. No intrahepatic or extrahepatic biliary dilation. Pancreas: Subtle edema/inflammation is seen around the head of pancreas and uncinate process (axial image 41/series 3). No dilatation of the main pancreatic duct. Fullness noted in the parenchyma of the pancreatic head, presumably edema. Spleen: No splenomegaly. No focal mass lesion. Adrenals/Urinary Tract: No adrenal nodule or mass. Tiny hypodensity in the interpolar right kidney is too small to characterize. Left kidney unremarkable. No evidence for hydroureter. The urinary bladder appears normal for the degree of distention. Stomach/Bowel: Stomach is nondistended. No gastric wall thickening. No evidence of outlet obstruction. There is some edema along the medial wall of the descending duodenum and around the proximal transverse segment. No small bowel wall thickening. No small bowel dilatation. The terminal ileum is normal. The appendix is not visualized, but there is no edema or inflammation in the region of the cecum. No gross colonic mass. No colonic wall thickening. Vascular/Lymphatic: There is abdominal aortic atherosclerosis without aneurysm. There is no gastrohepatic or hepatoduodenal ligament lymphadenopathy. No intraperitoneal or retroperitoneal lymphadenopathy. No pelvic sidewall  lymphadenopathy. Reproductive: Uterus surgically absent.  There is no adnexal mass. Other: No intraperitoneal free fluid. Musculoskeletal: No worrisome lytic or sclerotic osseous abnormality. IMPRESSION: 1. Subtle edema/inflammation around the head of pancreas and uncinate process with fullness in the pancreatic head. Although there is some edema adjacent to the descending and proximal transverse duodenum, pancreas appears to be the epicenter of the changes and acute pancreatitis is favored over duodenitis. Soft tissue fullness in the head of pancreas likely reflects edema in the parenchyma, but follow-up MRI of the abdomen without and with contrast after resolution of acute symptoms recommended. No dilatation of the main pancreatic duct. No evidence for pancreatic necrosis or pseudocyst at this time. 2.  Aortic Atherosclerois (ICD10-170.0) Electronically Signed   By: Misty Stanley M.D.   On: 11/06/2018 19:18   US Abdomen Limited  Result Date: 11/06/2018 CLINICAL DATA:  Initial evaluation for acute right upper quadrant pain. EXAM: ULTRASOUND ABDOMEN LIMITED RIGHT UPPER QUADRANT COMPARISON:  None. FINDINGS: Gallbladder: No gallstones or wall thickening visualized. No sonographic Murphy sign noted by sonographer. Common bile duct: Diameter: 2.7 mm Liver: No focal lesion identified. Increased echogenicity within the patent parenchyma, suggesting steatosis. Portal vein is patent on color Doppler imaging with normal direction of blood flow towards the liver. IMPRESSION: 1. Normal sonographic appearance of the gallbladder. No cholelithiasis, acute cholecystitis, or biliary dilatation. 2. Mild hepatic steatosis. Electronically Signed   By: Jeannine Boga M.D.   On: 11/06/2018 17:14     Labs:   Basic Metabolic Panel: Recent Labs  Lab 11/04/18 1712 11/06/18 1657 11/06/18 2136 11/07/18 0447  NA 142 138  --  137  K 4.9 4.0  --  3.9  CL 103 105  --  107  CO2 22 24  --  24  GLUCOSE 77 89  --  97  BUN  16 12  --  10  CREATININE 1.41* 1.69* 1.50* 1.50*  CALCIUM 9.8 9.4  --  8.7*   GFR Estimated Creatinine Clearance: 44.7 mL/min (A) (by C-G formula based on SCr of 1.5 mg/dL (H)). Liver Function Tests: Recent Labs  Lab 11/04/18 1712 11/06/18 1657 11/07/18 0447  AST 21 22 21   ALT 11 13 12   ALKPHOS 49 38 32*  BILITOT 0.3 0.8 1.1  PROT 7.7 8.3* 6.7  ALBUMIN 4.0 3.8 3.2*   Recent Labs  Lab 11/04/18 1712 11/06/18 1657 11/07/18 0626  LIPASE 57 60* 38   No results for input(s): AMMONIA in the last 168 hours. Coagulation profile No results for input(s): INR, PROTIME in the last 168 hours.  CBC: Recent Labs  Lab 11/04/18 1656 11/06/18 1657 11/06/18 2136 11/07/18 0447  WBC 8.9 8.8 9.0 7.8  NEUTROABS  --  5.9  --   --   HGB 12.4 12.0 11.1* 10.7*  HCT 36.2 38.0 34.6* 32.6*  MCV 80.5 83.7 82.8 83.8  PLT  --  293 288 269   Cardiac Enzymes: Recent Labs  Lab 11/06/18 2136 11/07/18 0626  TROPONINI <0.03 <0.03   BNP: Invalid input(s): POCBNP CBG: No results for input(s): GLUCAP in the last 168 hours. D-Dimer No results for input(s): DDIMER in the last 72 hours. Hgb A1c No results for input(s): HGBA1C in the last 72 hours. Lipid Profile Recent Labs    11/06/18 1657  CHOL 229*  HDL 39*  LDLCALC 153*  TRIG 186*  CHOLHDL 5.9   Thyroid function studies No results for input(s): TSH, T4TOTAL, T3FREE, THYROIDAB in the last 72 hours.  Invalid input(s): FREET3 Anemia work up No results for input(s): VITAMINB12, FOLATE, FERRITIN, TIBC, IRON, RETICCTPCT in the last 72 hours. Microbiology Recent Results (from the past 240 hour(s))  Urine Culture     Status: None   Collection Time: 11/04/18  5:18 PM  Result Value Ref Range Status   Urine Culture, Routine Final report  Final   Organism ID, Bacteria Comment  Final    Comment: Mixed urogenital flora Less than 10,000 colonies/mL   Urine culture     Status: Abnormal   Collection Time: 11/06/18  3:42 PM  Result Value  Ref Range Status   Specimen Description URINE, CLEAN CATCH  Final   Special Requests   Final    NONE Performed at [facility], [place] [address], [place], [place] [address]    Culture MULTIPLE SPECIES PRESENT, SUGGEST RECOLLECTION (A)  Final  Report Status 11/07/2018 FINAL  Final     Discharge Instructions:   Discharge Instructions    Discharge instructions   Complete by:  As directed    [facility] will arrange office visit in 4-5 weeks with MRI of pancreas for follow up Bland/low salt diet   Increase activity slowly   Complete by:  As directed      Allergies as of 11/08/2018      Reactions   Darvocet [propoxyphene N-acetaminophen] Nausea And Vomiting      Medication List    STOP taking these medications   sulfamethoxazole-trimethoprim 800-160 MG tablet Commonly known as:  BACTRIM DS,SEPTRA DS     TAKE these medications   acetaminophen 650 MG CR tablet Commonly known as:  TYLENOL Take 650 mg by mouth every 8 (eight) hours as needed for pain.   albuterol 108 (90 Base) MCG/ACT inhaler Commonly known as:  PROVENTIL HFA;VENTOLIN HFA Inhale 2 puffs into the lungs every 6 (six) hours as needed.   levothyroxine 100 MCG tablet Commonly known as:  SYNTHROID, LEVOTHROID Take 1 tablet (100 mcg total) by mouth daily with breakfast.   lisinopril-hydrochlorothiazide 10-12.5 MG tablet Commonly known as:  PRINZIDE,ZESTORETIC Take 1 tablet by mouth daily.   omeprazole 20 MG capsule Commonly known as:  PRILOSEC Take 1 capsule (20 mg total) by mouth daily.   ondansetron 4 MG disintegrating tablet Commonly known as:  ZOFRAN ODT Take 1 tablet (4 mg total) by mouth every 8 (eight) hours as needed for nausea or vomiting.      Follow-up Information    Horald Pollen, MD Follow up in 1 week(s).   Specialty:  Internal Medicine Contact information: [place] [address] [phone]        [facility] Follow up.   Why:  will call with  follow up appointment           Time coordinating discharge: 25 min  Signed:  [facility] 11/08/2018, 3:14 PM

## 2018-11-08 NOTE — Progress Notes (Signed)
Discharge education complete. Meds, diet, activity, follow up appointments reviewed and all questions answered. Copy of instructions given to patient. Patient discharged home via wheelchair with daughter.

## 2018-11-08 NOTE — Interval H&P Note (Signed)
History and Physical Interval Note:  11/08/2018 9:47 AM  Erika Cervantes  has presented today for surgery, with the diagnosis of duodenitis  The various methods of treatment have been discussed with the patient and family. After consideration of risks, benefits and other options for treatment, the patient has consented to  Procedure(s): ESOPHAGOGASTRODUODENOSCOPY (EGD) WITH PROPOFOL (N/A) as a surgical intervention .  The patient's history has been reviewed, patient examined, no change in status, stable for surgery.  I have reviewed the patient's chart and labs.  Questions were answered to the patient's satisfaction.     Milus Banister

## 2018-11-08 NOTE — Transfer of Care (Signed)
Immediate Anesthesia Transfer of Care Note  Patient: Erika Cervantes  Procedure(s) Performed: ESOPHAGOGASTRODUODENOSCOPY (EGD) WITH PROPOFOL (N/A )  Patient Location: PACU and Endoscopy Unit  Anesthesia Type:MAC  Level of Consciousness: drowsy and patient cooperative  Airway & Oxygen Therapy: Patient Spontanous Breathing and Patient connected to nasal cannula oxygen  Post-op Assessment: Report given to RN and Post -op Vital signs reviewed and stable  Post vital signs: Reviewed and stable  Last Vitals:  Vitals Value Taken Time  BP    Temp    Pulse 80 11/08/2018 10:33 AM  Resp 16 11/08/2018 10:33 AM  SpO2 98 % 11/08/2018 10:33 AM  Vitals shown include unvalidated device data.  Last Pain:  Vitals:   11/08/18 0930  TempSrc: Oral  PainSc: 0-No pain         Complications: No apparent anesthesia complications

## 2018-11-08 NOTE — Op Note (Signed)
[facility] Patient Name: Erika Cervantes Procedure Date : 11/08/2018 MRN: [id number] Attending MD: Milus Banister , MD Date of Birth: [birth year] CSN: [id number] Age: 63 Admit Type: Inpatient Procedure:                Upper GI endoscopy Indications:              Epigastric abdominal pain, Abnormal CT of the GI                            tract Providers:                Milus Banister, MD, Zenon Mayo, RN, Cletis Athens, Technician Referring MD:              Medicines:                Monitored Anesthesia Care Complications:            No immediate complications. Estimated blood loss:                            None. Estimated Blood Loss:     Estimated blood loss was minimal. Procedure:                Pre-Anesthesia Assessment:                           - Prior to the procedure, a History and Physical                            was performed, and patient medications and                            allergies were reviewed. The patient's tolerance of                            previous anesthesia was also reviewed. The risks                            and benefits of the procedure and the sedation                            options and risks were discussed with the patient.                            All questions were answered, and informed consent                            was obtained. Prior Anticoagulants: The patient has                            taken no previous anticoagulant or antiplatelet                            agents. ASA  Grade Assessment: II - A patient with                            mild systemic disease. After reviewing the risks                            and benefits, the patient was deemed in                            satisfactory condition to undergo the procedure.                           After obtaining informed consent, the endoscope was                            passed under direct vision. Throughout the              procedure, the patient's blood pressure, pulse, and                            oxygen saturations were monitored continuously. The                            [id number]-[id number] ([id number]) Olympus gastroscope was                            introduced through the mouth, and advanced to the                            second part of duodenum. The upper GI endoscopy was                            accomplished without difficulty. The patient                            tolerated the procedure well. Scope In: Scope Out: Findings:      The esophagus was normal.      The stomach was normal.      The examined duodenum was normal. Impression:               - Normal UGI tract. Recommendation:           - Return patient to hospital ward for ongoing care.                           - Empiric treatment for her abdominal pains. Will                            advance diet today.                           - OK for d/c when tolerating diet and pain improved.                           - [facility] will arrange office visit in 4-5  weeks                            with MRI of pancreas to check for interval                            resolution of the changes noted on recent CT. Procedure Code(s):        --- Professional ---                           [phone], Esophagogastroduodenoscopy, flexible,                            transoral; diagnostic, including collection of                            specimen(s) by brushing or washing, when performed                            (separate procedure) Diagnosis Code(s):        --- Professional ---                           R10.13, Epigastric pain                           R93.3, Abnormal findings on diagnostic imaging of                            other parts of digestive tract CPT copyright 2018 [employer]. All rights reserved. The codes documented in this report are preliminary and upon coder review may  be revised to meet current compliance  requirements. Milus Banister, MD 11/08/2018 10:38:19 AM This report has been signed electronically. Number of Addenda: 0

## 2018-11-09 ENCOUNTER — Encounter (HOSPITAL_COMMUNITY): Payer: Self-pay | Admitting: Gastroenterology

## 2018-11-11 ENCOUNTER — Ambulatory Visit: Payer: Managed Care, Other (non HMO) | Admitting: Gastroenterology

## 2019-01-09 ENCOUNTER — Other Ambulatory Visit: Payer: Self-pay | Admitting: Emergency Medicine

## 2019-01-09 DIAGNOSIS — E039 Hypothyroidism, unspecified: Secondary | ICD-10-CM

## 2019-01-10 NOTE — Telephone Encounter (Signed)
Requested Prescriptions  Pending Prescriptions Disp Refills  . levothyroxine (SYNTHROID, LEVOTHROID) 100 MCG tablet [Pharmacy Med Name: LEVOTHYROXINE 100 MCG TABLET] 90 tablet 0    Sig: TAKE ONE TABLET BY MOUTH DAILY WITH BREAKFAST     Endocrinology:  Hypothyroid Agents Failed - 01/09/2019  2:46 PM      Failed - TSH needs to be rechecked within 3 months after an abnormal result. Refill until TSH is due.      Failed - TSH in normal range and within 360 days    TSH  Date Value Ref Range Status  01/11/2018 7.030 (H) 0.450 - 4.500 uIU/mL Final         Passed - Valid encounter within last 12 months    Recent Outpatient Visits          2 months ago Abdominal pain, epigastric   [facility] at [facility], Ranell Patrick, MD   2 months ago Essential hypertension   Primary Care at [facility], Ines Bloomer, MD   12 months ago Essential hypertension   Primary Care at [place], [place]   1 year ago Acute bronchitis, unspecified organism   Primary Care at [place], [place]   1 year ago Acute bronchitis, unspecified organism   Primary Care at [facility] [place], [place]      Future Appointments            In 3 months Sagardia, Ines Bloomer, MD Primary Care at [place], [facility]

## 2019-02-07 ENCOUNTER — Other Ambulatory Visit: Payer: Self-pay | Admitting: Emergency Medicine

## 2019-02-07 DIAGNOSIS — K219 Gastro-esophageal reflux disease without esophagitis: Secondary | ICD-10-CM

## 2019-02-07 NOTE — Telephone Encounter (Signed)
Requested Prescriptions  Pending Prescriptions Disp Refills  . omeprazole (PRILOSEC) 20 MG capsule [Pharmacy Med Name: OMEPRAZOLE DR 20 MG CAPSULE] 90 capsule 0    Sig: TAKE ONE CAPSULE BY MOUTH DAILY     Gastroenterology: Proton Pump Inhibitors Passed - 02/07/2019  9:43 AM      Passed - Valid encounter within last 12 months    Recent Outpatient Visits          3 months ago Abdominal pain, epigastric   [facility] at [place], MD   3 months ago Essential hypertension   Primary Care at [facility], Ines Bloomer, MD   1 year ago Essential hypertension   Primary Care at [place], [place]   1 year ago Acute bronchitis, unspecified organism   Primary Care at [place], [place]   1 year ago Acute bronchitis, unspecified organism   Primary Care at [facility], [place], [place]      Future Appointments            In 2 months Sagardia, Ines Bloomer, MD Primary Care at [place], [facility]

## 2019-04-20 ENCOUNTER — Other Ambulatory Visit: Payer: Self-pay | Admitting: Emergency Medicine

## 2019-04-20 DIAGNOSIS — E039 Hypothyroidism, unspecified: Secondary | ICD-10-CM

## 2019-04-20 DIAGNOSIS — K219 Gastro-esophageal reflux disease without esophagitis: Secondary | ICD-10-CM

## 2019-04-25 ENCOUNTER — Ambulatory Visit: Payer: Self-pay | Admitting: Emergency Medicine

## 2019-08-01 ENCOUNTER — Ambulatory Visit: Payer: Managed Care, Other (non HMO) | Admitting: Emergency Medicine

## 2019-08-01 ENCOUNTER — Encounter: Payer: Self-pay | Admitting: Emergency Medicine

## 2019-08-01 ENCOUNTER — Other Ambulatory Visit: Payer: Self-pay

## 2019-08-01 VITALS — BP 152/73 | HR 76 | Temp 98.2°F | Resp 16 | Ht 66.0 in | Wt 200.0 lb

## 2019-08-01 DIAGNOSIS — I10 Essential (primary) hypertension: Secondary | ICD-10-CM

## 2019-08-01 DIAGNOSIS — K219 Gastro-esophageal reflux disease without esophagitis: Secondary | ICD-10-CM

## 2019-08-01 DIAGNOSIS — Z23 Encounter for immunization: Secondary | ICD-10-CM | POA: Diagnosis not present

## 2019-08-01 DIAGNOSIS — Z1159 Encounter for screening for other viral diseases: Secondary | ICD-10-CM

## 2019-08-01 DIAGNOSIS — E785 Hyperlipidemia, unspecified: Secondary | ICD-10-CM | POA: Diagnosis not present

## 2019-08-01 DIAGNOSIS — E039 Hypothyroidism, unspecified: Secondary | ICD-10-CM | POA: Diagnosis not present

## 2019-08-01 DIAGNOSIS — Z862 Personal history of diseases of the blood and blood-forming organs and certain disorders involving the immune mechanism: Secondary | ICD-10-CM

## 2019-08-01 MED ORDER — ROSUVASTATIN CALCIUM 20 MG PO TABS: 20.0000 mg | ORAL_TABLET | Freq: Every day | ORAL | 3 refills | Status: DC

## 2019-08-01 MED ORDER — LISINOPRIL-HYDROCHLOROTHIAZIDE 20-12.5 MG PO TABS: 1.0000 | ORAL_TABLET | Freq: Every day | ORAL | 3 refills | Status: DC

## 2019-08-01 MED ORDER — LEVOTHYROXINE SODIUM 100 MCG PO TABS: 100.0000 ug | ORAL_TABLET | Freq: Every day | ORAL | 0 refills | Status: DC

## 2019-08-01 MED ORDER — OMEPRAZOLE 20 MG PO CPDR: 20.0000 mg | DELAYED_RELEASE_CAPSULE | Freq: Every day | ORAL | 1 refills | Status: DC

## 2019-08-01 NOTE — Patient Instructions (Addendum)
   If you have lab work done today you will be contacted with your lab results within the next 2 weeks.  If you have not heard from us then please contact us. The fastest way to get your results is to register for My Chart.   IF you received an x-ray today, you will receive an invoice from [facility]. Please contact [facility] at [phone] with questions or concerns regarding your invoice.   IF you received labwork today, you will receive an invoice from [facility]. Please contact [facility] at [phone] with questions or concerns regarding your invoice.   Our billing staff will not be able to assist you with questions regarding bills from these companies.  You will be contacted with the lab results as soon as they are available. The fastest way to get your results is to activate your My Chart account. Instructions are located on the last page of this paperwork. If you have not heard from us regarding the results in 2 weeks, please contact this office.     Hypertension, Adult High blood pressure (hypertension) is when the force of blood pumping through the arteries is too strong. The arteries are the blood vessels that carry blood from the heart throughout the body. Hypertension forces the heart to work harder to pump blood and may cause arteries to become narrow or stiff. Untreated or uncontrolled hypertension can cause a heart attack, heart failure, a stroke, kidney disease, and other problems. A blood pressure reading consists of a higher number over a lower number. Ideally, your blood pressure should be below 120/80. The first ("top") number is called the systolic pressure. It is a measure of the pressure in your arteries as your heart beats. The second ("bottom") number is called the diastolic pressure. It is a measure of the pressure in your arteries as the heart relaxes. What are the causes? The exact cause of this condition is not known. There are some conditions  that result in or are related to high blood pressure. What increases the risk? Some risk factors for high blood pressure are under your control. The following factors may make you more likely to develop this condition:  Smoking.  Having type 2 diabetes mellitus, high cholesterol, or both.  Not getting enough exercise or physical activity.  Being overweight.  Having too much fat, sugar, calories, or salt (sodium) in your diet.  Drinking too much alcohol. Some risk factors for high blood pressure may be difficult or impossible to change. Some of these factors include:  Having chronic kidney disease.  Having a family history of high blood pressure.  Age. Risk increases with age.  Race. You may be at higher risk if you are African American.  Gender. Men are at higher risk than women before age 45. After age 65, women are at higher risk than men.  Having obstructive sleep apnea.  Stress. What are the signs or symptoms? High blood pressure may not cause symptoms. Very high blood pressure (hypertensive crisis) may cause:  Headache.  Anxiety.  Shortness of breath.  Nosebleed.  Nausea and vomiting.  Vision changes.  Severe chest pain.  Seizures. How is this diagnosed? This condition is diagnosed by measuring your blood pressure while you are seated, with your arm resting on a flat surface, your legs uncrossed, and your feet flat on the floor. The cuff of the blood pressure monitor will be placed directly against the skin of your upper arm at the level of your heart.   It should be measured at least twice using the same arm. Certain conditions can cause a difference in blood pressure between your right and left arms. Certain factors can cause blood pressure readings to be lower or higher than normal for a short period of time:  When your blood pressure is higher when you are in a health care provider's office than when you are at home, this is called white coat hypertension.  Most people with this condition do not need medicines.  When your blood pressure is higher at home than when you are in a health care provider's office, this is called masked hypertension. Most people with this condition may need medicines to control blood pressure. If you have a high blood pressure reading during one visit or you have normal blood pressure with other risk factors, you may be asked to:  Return on a different day to have your blood pressure checked again.  Monitor your blood pressure at home for 1 week or longer. If you are diagnosed with hypertension, you may have other blood or imaging tests to help your health care provider understand your overall risk for other conditions. How is this treated? This condition is treated by making healthy lifestyle changes, such as eating healthy foods, exercising more, and reducing your alcohol intake. Your health care provider may prescribe medicine if lifestyle changes are not enough to get your blood pressure under control, and if:  Your systolic blood pressure is above 130.  Your diastolic blood pressure is above 80. Your personal target blood pressure may vary depending on your medical conditions, your age, and other factors. Follow these instructions at home: Eating and drinking   Eat a diet that is high in fiber and potassium, and low in sodium, added sugar, and fat. An example eating plan is called the DASH (Dietary Approaches to Stop Hypertension) diet. To eat this way: ? Eat plenty of fresh fruits and vegetables. Try to fill one half of your plate at each meal with fruits and vegetables. ? Eat whole grains, such as whole-wheat pasta, brown rice, or whole-grain bread. Fill about one fourth of your plate with whole grains. ? Eat or drink low-fat dairy products, such as skim milk or low-fat yogurt. ? Avoid fatty cuts of meat, processed or cured meats, and poultry with skin. Fill about one fourth of your plate with lean proteins, such  as fish, chicken without skin, beans, eggs, or tofu. ? Avoid pre-made and processed foods. These tend to be higher in sodium, added sugar, and fat.  Reduce your daily sodium intake. Most people with hypertension should eat less than 1,500 mg of sodium a day.  Do not drink alcohol if: ? Your health care provider tells you not to drink. ? You are pregnant, may be pregnant, or are planning to become pregnant.  If you drink alcohol: ? Limit how much you use to:  0-1 drink a day for women.  0-2 drinks a day for men. ? Be aware of how much alcohol is in your drink. In the [place] one drink equals one 12 oz bottle of beer (355 mL), one 5 oz glass of wine (148 mL), or one 1 oz glass of hard liquor (44 mL). Lifestyle   Work with your health care provider to maintain a healthy body weight or to lose weight. Ask what an ideal weight is for you.  Get at least 30 minutes of exercise most days of the week. Activities may include walking, swimming, or   biking.  Include exercise to strengthen your muscles (resistance exercise), such as Pilates or lifting weights, as part of your weekly exercise routine. Try to do these types of exercises for 30 minutes at least 3 days a week.  Do not use any products that contain nicotine or tobacco, such as cigarettes, e-cigarettes, and chewing tobacco. If you need help quitting, ask your health care provider.  Monitor your blood pressure at home as told by your health care provider.  Keep all follow-up visits as told by your health care provider. This is important. Medicines  Take over-the-counter and prescription medicines only as told by your health care provider. Follow directions carefully. Blood pressure medicines must be taken as prescribed.  Do not skip doses of blood pressure medicine. Doing this puts you at risk for problems and can make the medicine less effective.  Ask your health care provider about side effects or reactions to medicines that you  should watch for. Contact a health care provider if you:  Think you are having a reaction to a medicine you are taking.  Have headaches that keep coming back (recurring).  Feel dizzy.  Have swelling in your ankles.  Have trouble with your vision. Get help right away if you:  Develop a severe headache or confusion.  Have unusual weakness or numbness.  Feel faint.  Have severe pain in your chest or abdomen.  Vomit repeatedly.  Have trouble breathing. Summary  Hypertension is when the force of blood pumping through your arteries is too strong. If this condition is not controlled, it may put you at risk for serious complications.  Your personal target blood pressure may vary depending on your medical conditions, your age, and other factors. For most people, a normal blood pressure is less than 120/80.  Hypertension is treated with lifestyle changes, medicines, or a combination of both. Lifestyle changes include losing weight, eating a healthy, low-sodium diet, exercising more, and limiting alcohol. This information is not intended to replace advice given to you by your health care provider. Make sure you discuss any questions you have with your health care provider. Document Released: 10/12/2005 Document Revised: 06/22/2018 Document Reviewed: 06/22/2018 [employer] Patient Education  2020 [employer]  

## 2019-08-01 NOTE — Assessment & Plan Note (Signed)
Abnormal lipid profile twice in the recent past with bout of pancreatitis last January.  Will start Crestor 20 mg daily.  Lipid profile while fasting repeated today.

## 2019-08-01 NOTE — Progress Notes (Signed)
BP Readings from Last 3 Encounters:  11/08/18 (!) 137/51  11/04/18 140/84  10/24/18 (!) 157/74   Lab Results  Component Value Date   CREATININE 1.50 (H) 11/07/2018   BUN 10 11/07/2018   NA 137 11/07/2018   K 3.9 11/07/2018   CL 107 11/07/2018   CO2 24 11/07/2018   Lab Results  Component Value Date   CHOL 229 (H) 11/06/2018   HDL 39 (L) 11/06/2018   LDLCALC 153 (H) 11/06/2018   LDLDIRECT 181 (H) 03/12/2014   TRIG 186 (H) 11/06/2018   CHOLHDL 5.9 11/06/2018   The 10-year ASCVD risk score Erika Bussing DC Jr., et al., 2013) is: 10.2%   Values used to calculate the score:     Age: 28 years     Sex: Female     Is Non-Hispanic African American: Yes     Diabetic: No     Tobacco smoker: No     Systolic Blood Pressure: 0000000 mmHg     Is BP treated: No     HDL Cholesterol: 39 mg/dL     Total Cholesterol: 229 mg/dL Erika Cervantes 63 y.o.   Chief Complaint  Patient presents with  . Medication Refill    PEND    HISTORY OF PRESENT ILLNESS: This is a 63 y.o. female with chronic medical problems here for follow-up: 1.  Hypertension: On Zestoretic 10-12 0.5.  Blood pressure readings at home on the high side. 2.  Thyroid disease: On Synthroid 100 mcg daily.  Doing well. 3.  History of GERD: On omeprazole 20 mg daily.  Doing well. 4.  History of pancreatitis January 2020, unknown cause.  However last 2 lipid profiles have been abnormal.  Presently not on cholesterol medication. 5.  Ex-smoker.  History of asthma in the past with pneumonia x2. No complaints or medical concerns today.  HPI   Prior to Admission medications   Medication Sig Start Date End Date Taking? Authorizing Provider  acetaminophen (TYLENOL) 650 MG CR tablet Take 650 mg by mouth every 8 (eight) hours as needed for pain.    [provider]  albuterol (PROVENTIL HFA;VENTOLIN HFA) 108 (90 Base) MCG/ACT inhaler Inhale 2 puffs into the lungs every 6 (six) hours as needed. Patient not taking: Reported on 01/11/2018  12/07/17   Jaynee Eagles, PA-C  levothyroxine (SYNTHROID) 100 MCG tablet Take 1 tablet (100 mcg total) by mouth daily with breakfast. Must KEEP upcoming appt 04/20/19   Horald Pollen, MD  lisinopril-hydrochlorothiazide (PRINZIDE,ZESTORETIC) 10-12.5 MG tablet Take 1 tablet by mouth daily. 10/24/18 01/22/19  Horald Pollen, MD  omeprazole (PRILOSEC) 20 MG capsule TAKE ONE CAPSULE BY MOUTH DAILY 04/20/19   Horald Pollen, MD  ondansetron (ZOFRAN ODT) 4 MG disintegrating tablet Take 1 tablet (4 mg total) by mouth every 8 (eight) hours as needed for nausea or vomiting. 11/04/18   Wendie Agreste, MD    Allergies  Allergen Reactions  . Darvocet [Propoxyphene N-Acetaminophen] Nausea And Vomiting    Patient Active Problem List   Diagnosis Date Noted  . Degenerative arthritis of thumb 09/04/2015  . Vitamin D deficiency 05/26/2012  . Hypothyroidism 02/26/2012  . Hyperlipidemia with target LDL less than 100 02/26/2012  . HTN (hypertension) 02/26/2012  . Obesity (BMI 30.0-34.9) 02/26/2012  . Bulging lumbar disc 02/26/2012  . GERD (gastroesophageal reflux disease) 02/26/2012    Past Medical History:  Diagnosis Date  . Allergy   . GERD (gastroesophageal reflux disease)   . Hypertension   . PONV (postoperative  nausea and vomiting)   . Thyroid disease     Past Surgical History:  Procedure Laterality Date  . ABDOMINAL HYSTERECTOMY  late 1980s   Fibroids were indication.  pt says also had SPO bil.    . APPENDECTOMY  late 1980s   done at time of hysto.    Marland Kitchen BREAST SURGERY     Right breast mass  . ESOPHAGOGASTRODUODENOSCOPY (EGD) WITH PROPOFOL N/A 11/08/2018   Procedure: ESOPHAGOGASTRODUODENOSCOPY (EGD) WITH PROPOFOL;  Surgeon: Milus Banister, MD;  Location: [facility] ENDOSCOPY;  Service: Endoscopy;  Laterality: N/A;  . Hammer toes    . TONSILLECTOMY      Social History   Socioeconomic History  . Marital status: Married    Spouse name: Not on file  . Number of children: Not  on file  . Years of education: Not on file  . Highest education level: Not on file  Occupational History  . Not on file  Social Needs  . Financial resource strain: Not on file  . Food insecurity    Worry: Not on file    Inability: Not on file  . Transportation needs    Medical: Not on file    Non-medical: Not on file  Tobacco Use  . Smoking status: Former Smoker    Quit date: 08/22/1997    Years since quitting: 21.9  . Smokeless tobacco: Never Used  Substance and Sexual Activity  . Alcohol use: No  . Drug use: No  . Sexual activity: Not on file  Lifestyle  . Physical activity    Days per week: Not on file    Minutes per session: Not on file  . Stress: Not on file  Relationships  . Social Herbalist on phone: Not on file    Gets together: Not on file    Attends religious service: Not on file    Active member of club or organization: Not on file    Attends meetings of clubs or organizations: Not on file    Relationship status: Not on file  . Intimate partner violence    Fear of current or ex partner: Not on file    Emotionally abused: Not on file    Physically abused: Not on file    Forced sexual activity: Not on file  Other Topics Concern  . Not on file  Social History Narrative  . Not on file    Family History  Problem Relation Age of Onset  . Heart disease Mother      Review of Systems  Constitutional: Negative.  Negative for chills and fever.  HENT: Negative.  Negative for congestion and sore throat.   Eyes: Negative.   Respiratory: Negative.  Negative for cough and shortness of breath.   Cardiovascular: Negative.  Negative for chest pain and palpitations.  Gastrointestinal: Negative.  Negative for abdominal pain, diarrhea, nausea and vomiting.  Genitourinary: Negative.   Musculoskeletal: Negative.   Skin: Negative.  Negative for rash.  Neurological: Negative.  Negative for dizziness and headaches.  Endo/Heme/Allergies: Negative.   All other  systems reviewed and are negative.   Vitals:   08/01/19 0858  BP: (!) 152/73  Pulse: 76  Temp: 98.2 F (36.8 C)    Physical Exam Vitals signs reviewed.  Constitutional:      Appearance: Normal appearance.  HENT:     Head: Normocephalic.  Eyes:     Extraocular Movements: Extraocular movements intact.     Conjunctiva/sclera: Conjunctivae normal.  Pupils: Pupils are equal, round, and reactive to light.  Neck:     Musculoskeletal: Normal range of motion and neck supple.  Cardiovascular:     Rate and Rhythm: Normal rate and regular rhythm.     Heart sounds: Normal heart sounds.  Pulmonary:     Effort: Pulmonary effort is normal.     Breath sounds: Normal breath sounds.  Abdominal:     Palpations: Abdomen is soft.     Tenderness: There is no abdominal tenderness.  Musculoskeletal:     Right lower leg: No edema.     Left lower leg: No edema.  Skin:    General: Skin is warm and dry.     Capillary Refill: Capillary refill takes less than 2 seconds.  Neurological:     General: No focal deficit present.     Mental Status: She is alert and oriented to person, place, and time.  Psychiatric:        Mood and Affect: Mood normal.        Behavior: Behavior normal.      ASSESSMENT & PLAN: HTN (hypertension) Elevated blood pressure.  Will increase Zestoretic to 20-12.5 mg daily.  Follow-up in 6 months.  Dyslipidemia Abnormal lipid profile twice in the recent past with bout of pancreatitis last January.  Will start Crestor 20 mg daily.  Lipid profile while fasting repeated today.   Erika Cervantes was seen today for medication refill.  Diagnoses and all orders for this visit:  Essential hypertension -     Comprehensive metabolic panel -     lisinopril-hydrochlorothiazide (ZESTORETIC) 20-12.5 MG tablet; Take 1 tablet by mouth daily.  Dyslipidemia -     Lipid panel -     Hemoglobin A1c -     rosuvastatin (CRESTOR) 20 MG tablet; Take 1 tablet (20 mg total) by mouth daily.   History of anemia -     CBC with Differential/Platelet  Hypothyroidism, unspecified type -     levothyroxine (SYNTHROID) 100 MCG tablet; Take 1 tablet (100 mcg total) by mouth daily with breakfast. Must KEEP upcoming appt  Gastroesophageal reflux disease without esophagitis -     omeprazole (PRILOSEC) 20 MG capsule; Take 1 capsule (20 mg total) by mouth daily.  Need for prophylactic vaccination and inoculation against influenza -     Flu Vaccine QUAD 36+ mos IM  Need for prophylactic vaccination against Streptococcus pneumoniae (pneumococcus) -     Pneumococcal polysaccharide vaccine 23-valent greater than or equal to 2yo subcutaneous/IM  Need for hepatitis C screening test -     Hepatitis C antibody     Patient Instructions       If you have lab work done today you will be contacted with your lab results within the next 2 weeks.  If you have not heard from [place] then please contact us. The fastest way to get your results is to register for My Chart.   IF you received an x-ray today, you will receive an invoice from [facility]. Please contact [facility] at [phone] with questions or concerns regarding your invoice.   IF you received labwork today, you will receive an invoice from [facility]. Please contact [facility] at [phone] with questions or concerns regarding your invoice.   Our billing staff will not be able to assist you with questions regarding bills from these companies.  You will be contacted with the lab results as soon as they are available. The fastest way to get your results is to activate your  My Chart account. Instructions are located on the last page of this paperwork. If you have not heard from [place] regarding the results in 2 weeks, please contact this office.     Hypertension, Adult High blood pressure (hypertension) is when the force of blood pumping through the arteries is too strong. The arteries are the blood vessels that carry  blood from the heart throughout the body. Hypertension forces the heart to work harder to pump blood and may cause arteries to become narrow or stiff. Untreated or uncontrolled hypertension can cause a heart attack, heart failure, a stroke, kidney disease, and other problems. A blood pressure reading consists of a higher number over a lower number. Ideally, your blood pressure should be below 120/80. The first ("top") number is called the systolic pressure. It is a measure of the pressure in your arteries as your heart beats. The second ("bottom") number is called the diastolic pressure. It is a measure of the pressure in your arteries as the heart relaxes. What are the causes? The exact cause of this condition is not known. There are some conditions that result in or are related to high blood pressure. What increases the risk? Some risk factors for high blood pressure are under your control. The following factors may make you more likely to develop this condition:  Smoking.  Having type 2 diabetes mellitus, high cholesterol, or both.  Not getting enough exercise or physical activity.  Being overweight.  Having too much fat, sugar, calories, or salt (sodium) in your diet.  Drinking too much alcohol. Some risk factors for high blood pressure may be difficult or impossible to change. Some of these factors include:  Having chronic kidney disease.  Having a family history of high blood pressure.  Age. Risk increases with age.  Race. You may be at higher risk if you are African American.  Gender. Men are at higher risk than women before age 83. After age 71, women are at higher risk than men.  Having obstructive sleep apnea.  Stress. What are the signs or symptoms? High blood pressure may not cause symptoms. Very high blood pressure (hypertensive crisis) may cause:  Headache.  Anxiety.  Shortness of breath.  Nosebleed.  Nausea and vomiting.  Vision changes.  Severe chest  pain.  Seizures. How is this diagnosed? This condition is diagnosed by measuring your blood pressure while you are seated, with your arm resting on a flat surface, your legs uncrossed, and your feet flat on the floor. The cuff of the blood pressure monitor will be placed directly against the skin of your upper arm at the level of your heart. It should be measured at least twice using the same arm. Certain conditions can cause a difference in blood pressure between your right and left arms. Certain factors can cause blood pressure readings to be lower or higher than normal for a short period of time:  When your blood pressure is higher when you are in a health care provider's office than when you are at home, this is called white coat hypertension. Most people with this condition do not need medicines.  When your blood pressure is higher at home than when you are in a health care provider's office, this is called masked hypertension. Most people with this condition may need medicines to control blood pressure. If you have a high blood pressure reading during one visit or you have normal blood pressure with other risk factors, you may be asked to:  Return  on a different day to have your blood pressure checked again.  Monitor your blood pressure at home for 1 week or longer. If you are diagnosed with hypertension, you may have other blood or imaging tests to help your health care provider understand your overall risk for other conditions. How is this treated? This condition is treated by making healthy lifestyle changes, such as eating healthy foods, exercising more, and reducing your alcohol intake. Your health care provider may prescribe medicine if lifestyle changes are not enough to get your blood pressure under control, and if:  Your systolic blood pressure is above 130.  Your diastolic blood pressure is above 80. Your personal target blood pressure may vary depending on your medical  conditions, your age, and other factors. Follow these instructions at home: Eating and drinking   Eat a diet that is high in fiber and potassium, and low in sodium, added sugar, and fat. An example eating plan is called the DASH (Dietary Approaches to Stop Hypertension) diet. To eat this way: ? Eat plenty of fresh fruits and vegetables. Try to fill one half of your plate at each meal with fruits and vegetables. ? Eat whole grains, such as whole-wheat pasta, brown rice, or whole-grain bread. Fill about one fourth of your plate with whole grains. ? Eat or drink low-fat dairy products, such as skim milk or low-fat yogurt. ? Avoid fatty cuts of meat, processed or cured meats, and poultry with skin. Fill about one fourth of your plate with lean proteins, such as fish, chicken without skin, beans, eggs, or tofu. ? Avoid pre-made and processed foods. These tend to be higher in sodium, added sugar, and fat.  Reduce your daily sodium intake. Most people with hypertension should eat less than 1,500 mg of sodium a day.  Do not drink alcohol if: ? Your health care provider tells you not to drink. ? You are pregnant, may be pregnant, or are planning to become pregnant.  If you drink alcohol: ? Limit how much you use to:  0-1 drink a day for women.  0-2 drinks a day for men. ? Be aware of how much alcohol is in your drink. In the [place] one drink equals one 12 oz bottle of beer (355 mL), one 5 oz glass of wine (148 mL), or one 1 oz glass of hard liquor (44 mL). Lifestyle   Work with your health care provider to maintain a healthy body weight or to lose weight. Ask what an ideal weight is for you.  Get at least 30 minutes of exercise most days of the week. Activities may include walking, swimming, or biking.  Include exercise to strengthen your muscles (resistance exercise), such as Pilates or lifting weights, as part of your weekly exercise routine. Try to do these types of exercises for 30 minutes  at least 3 days a week.  Do not use any products that contain nicotine or tobacco, such as cigarettes, e-cigarettes, and chewing tobacco. If you need help quitting, ask your health care provider.  Monitor your blood pressure at home as told by your health care provider.  Keep all follow-up visits as told by your health care provider. This is important. Medicines  Take over-the-counter and prescription medicines only as told by your health care provider. Follow directions carefully. Blood pressure medicines must be taken as prescribed.  Do not skip doses of blood pressure medicine. Doing this puts you at risk for problems and can make the medicine less effective.  Ask your health care provider about side effects or reactions to medicines that you should watch for. Contact a health care provider if you:  Think you are having a reaction to a medicine you are taking.  Have headaches that keep coming back (recurring).  Feel dizzy.  Have swelling in your ankles.  Have trouble with your vision. Get help right away if you:  Develop a severe headache or confusion.  Have unusual weakness or numbness.  Feel faint.  Have severe pain in your chest or abdomen.  Vomit repeatedly.  Have trouble breathing. Summary  Hypertension is when the force of blood pumping through your arteries is too strong. If this condition is not controlled, it may put you at risk for serious complications.  Your personal target blood pressure may vary depending on your medical conditions, your age, and other factors. For most people, a normal blood pressure is less than 120/80.  Hypertension is treated with lifestyle changes, medicines, or a combination of both. Lifestyle changes include losing weight, eating a healthy, low-sodium diet, exercising more, and limiting alcohol. This information is not intended to replace advice given to you by your health care provider. Make sure you discuss any questions you have  with your health care provider. Document Released: 10/12/2005 Document Revised: 06/22/2018 Document Reviewed: 06/22/2018 Elsevier Patient Education  2020 Elsevier Inc.     Agustina Caroli, MD [facility]

## 2019-08-01 NOTE — Assessment & Plan Note (Signed)
Elevated blood pressure.  Will increase Zestoretic to 20-12.5 mg daily.  Follow-up in 6 months.

## 2019-08-02 LAB — HEMOGLOBIN A1C
Est. average glucose Bld gHb Est-mCnc: 128 mg/dL
Hgb A1c MFr Bld: 6.1 % — ABNORMAL HIGH (ref 4.8–5.6)

## 2019-08-02 LAB — COMPREHENSIVE METABOLIC PANEL
ALT: 10 IU/L (ref 0–32)
AST: 19 IU/L (ref 0–40)
Albumin/Globulin Ratio: 1.3 (ref 1.2–2.2)
Albumin: 4 g/dL (ref 3.8–4.8)
Alkaline Phosphatase: 46 IU/L (ref 39–117)
BUN/Creatinine Ratio: 15 (ref 12–28)
BUN: 19 mg/dL (ref 8–27)
Bilirubin Total: 0.4 mg/dL (ref 0.0–1.2)
CO2: 22 mmol/L (ref 20–29)
Calcium: 9.9 mg/dL (ref 8.7–10.3)
Chloride: 100 mmol/L (ref 96–106)
Creatinine, Ser: 1.3 mg/dL — ABNORMAL HIGH (ref 0.57–1.00)
GFR calc Af Amer: 50 mL/min/{1.73_m2} — ABNORMAL LOW (ref >59)
GFR calc non Af Amer: 44 mL/min/{1.73_m2} — ABNORMAL LOW (ref >59)
Globulin, Total: 3.2 g/dL (ref 1.5–4.5)
Glucose: 82 mg/dL (ref 65–99)
Potassium: 4.3 mmol/L (ref 3.5–5.2)
Sodium: 137 mmol/L (ref 134–144)
Total Protein: 7.2 g/dL (ref 6.0–8.5)

## 2019-08-02 LAB — CBC WITH DIFFERENTIAL/PLATELET
Basophils Absolute: 0.1 10*3/uL (ref 0.0–0.2)
Basos: 1 %
EOS (ABSOLUTE): 0.3 10*3/uL (ref 0.0–0.4)
Eos: 4 %
Hematocrit: 37.3 % (ref 34.0–46.6)
Hemoglobin: 12.2 g/dL (ref 11.1–15.9)
Immature Grans (Abs): 0 10*3/uL (ref 0.0–0.1)
Immature Granulocytes: 0 %
Lymphocytes Absolute: 2.2 10*3/uL (ref 0.7–3.1)
Lymphs: 30 %
MCH: 26.8 pg (ref 26.6–33.0)
MCHC: 32.7 g/dL (ref 31.5–35.7)
MCV: 82 fL (ref 79–97)
Monocytes Absolute: 0.6 10*3/uL (ref 0.1–0.9)
Monocytes: 8 %
Neutrophils Absolute: 4 10*3/uL (ref 1.4–7.0)
Neutrophils: 57 %
Platelets: 309 10*3/uL (ref 150–450)
RBC: 4.56 x10E6/uL (ref 3.77–5.28)
RDW: 13.3 % (ref 11.7–15.4)
WBC: 7.1 10*3/uL (ref 3.4–10.8)

## 2019-08-02 LAB — LIPID PANEL
Chol/HDL Ratio: 5.7 ratio — ABNORMAL HIGH (ref 0.0–4.4)
Cholesterol, Total: 233 mg/dL — ABNORMAL HIGH (ref 100–199)
HDL: 41 mg/dL (ref >39)
LDL Chol Calc (NIH): 163 mg/dL — ABNORMAL HIGH (ref 0–99)
Triglycerides: 157 mg/dL — ABNORMAL HIGH (ref 0–149)
VLDL Cholesterol Cal: 29 mg/dL (ref 5–40)

## 2019-08-02 LAB — HEPATITIS C ANTIBODY: Hep C Virus Ab: 0.1 s/co ratio (ref 0.0–0.9)

## 2019-09-13 ENCOUNTER — Telehealth: Payer: Self-pay | Admitting: Emergency Medicine

## 2019-09-13 NOTE — Telephone Encounter (Signed)
LVM to r/s appt on 01/30/2020 with Dr. Mitchel Honour. Provider will be out of the office on that day.

## 2019-10-28 ENCOUNTER — Other Ambulatory Visit: Payer: Self-pay | Admitting: Emergency Medicine

## 2019-10-28 DIAGNOSIS — E039 Hypothyroidism, unspecified: Secondary | ICD-10-CM

## 2020-01-01 IMAGING — US US ABDOMEN LIMITED
1 series · 14 of 25 positions shown · non-contrast
Comparison: None.

CLINICAL DATA: Initial evaluation for acute right upper quadrant
pain.

EXAM:
ULTRASOUND ABDOMEN LIMITED RIGHT UPPER QUADRANT

[Series 1: us abdomen limited · 14 of 42 slices shown]
[im 1/42]
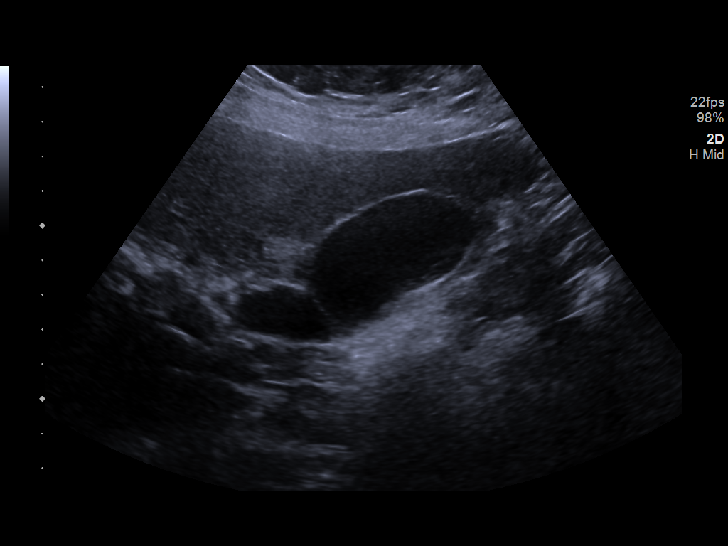
[im 4/42]
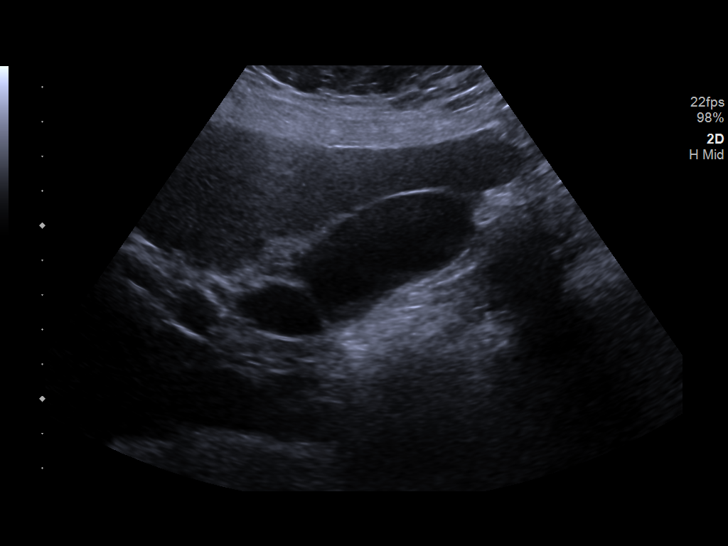
[im 7/42]
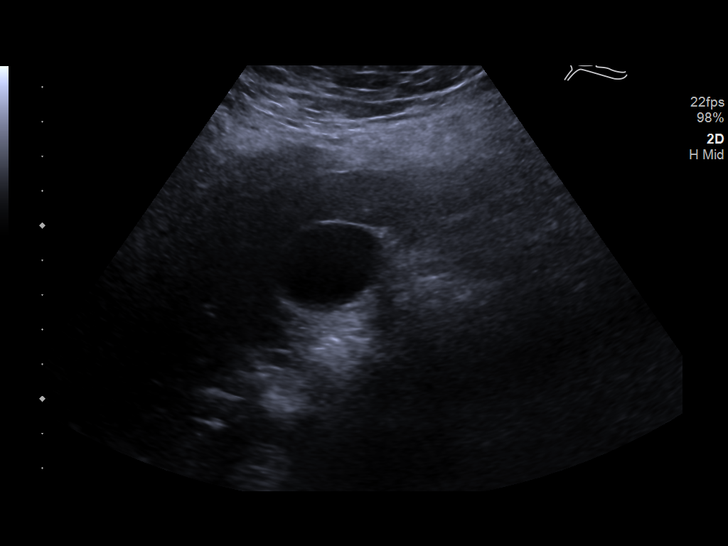
[im 11/42]
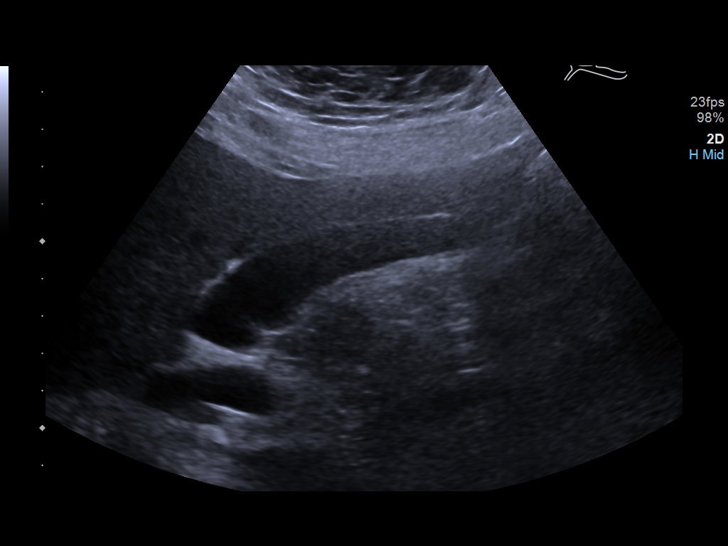
[im 14/42]
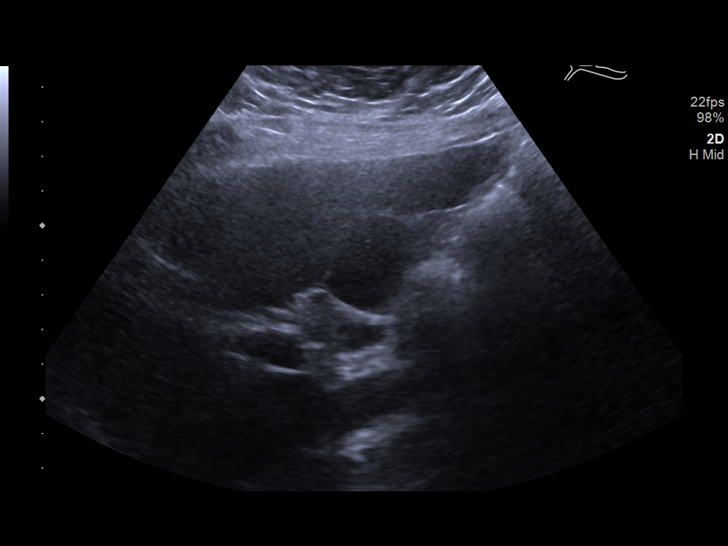
[im 16/42]
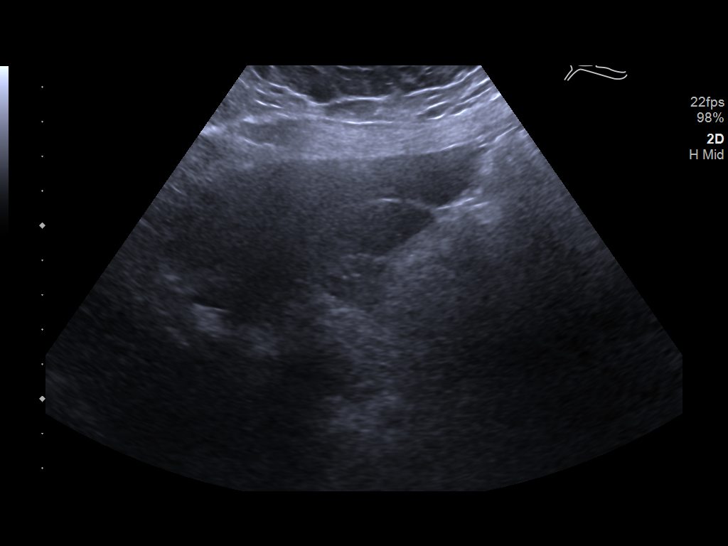
[im 19/42]
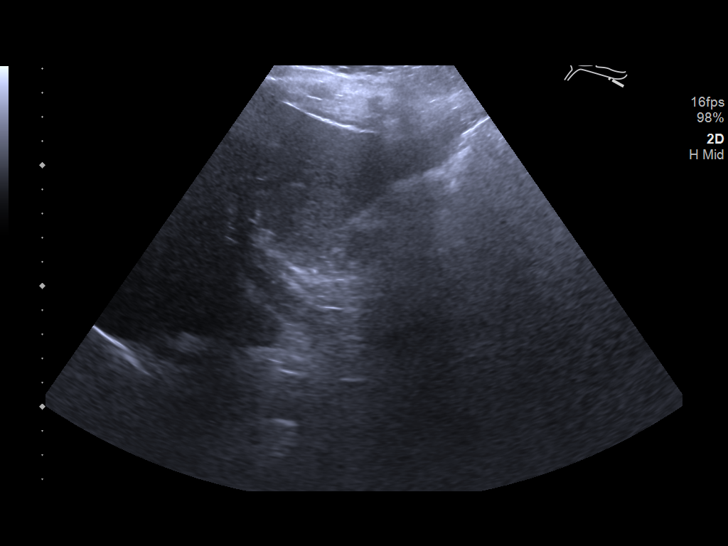
[im 23/42]
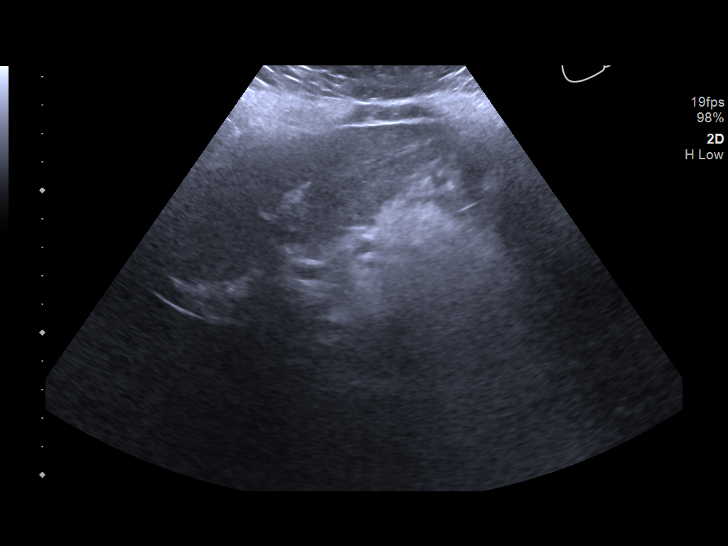
[im 26/42]
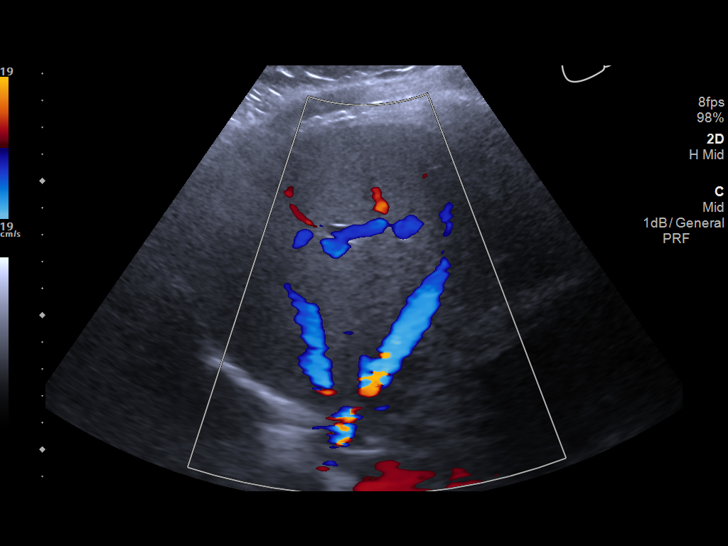
[im 28/42]
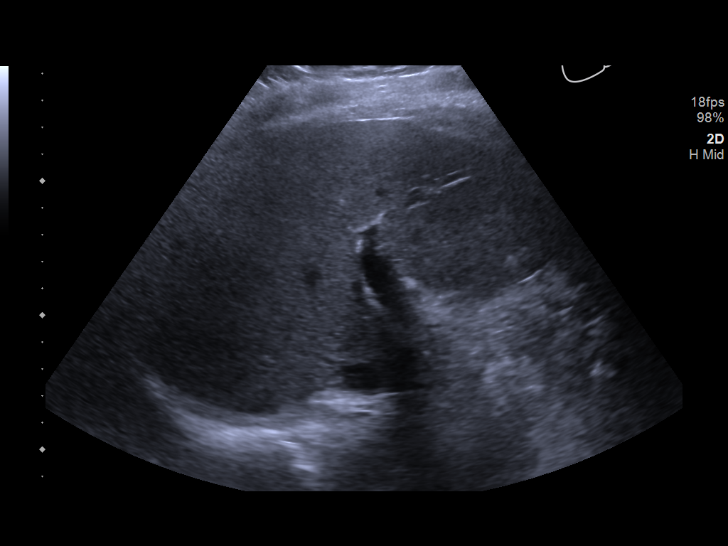
[im 31/42]
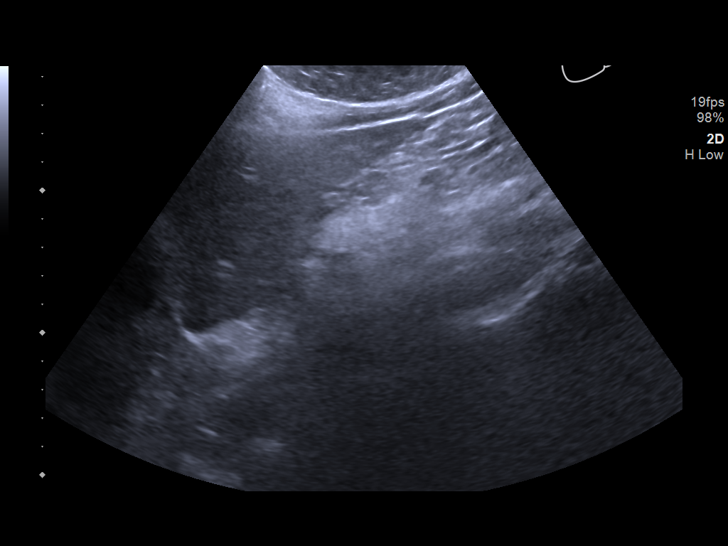
[im 35/42]
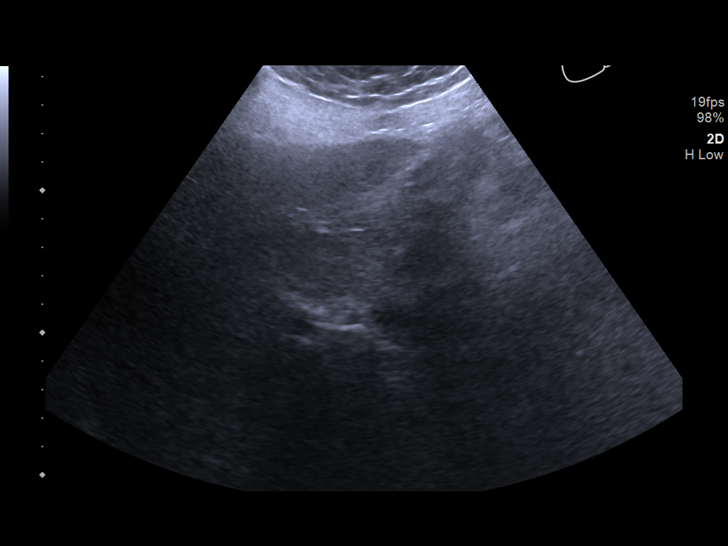
[im 38/42]
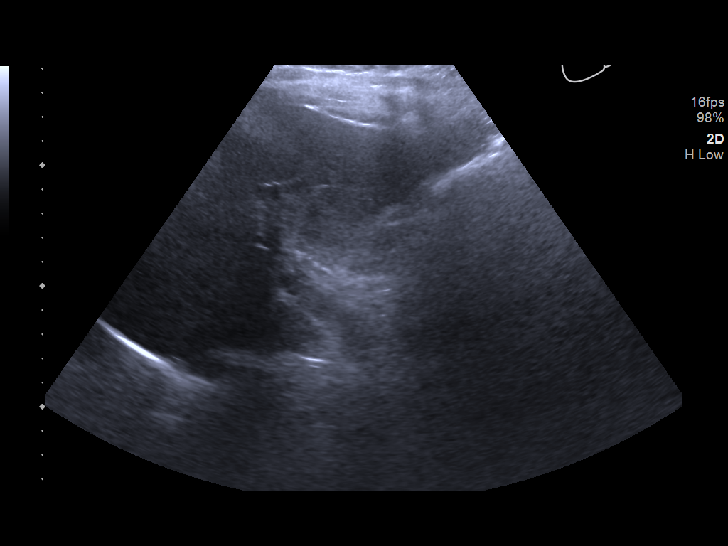
[im 42/42]
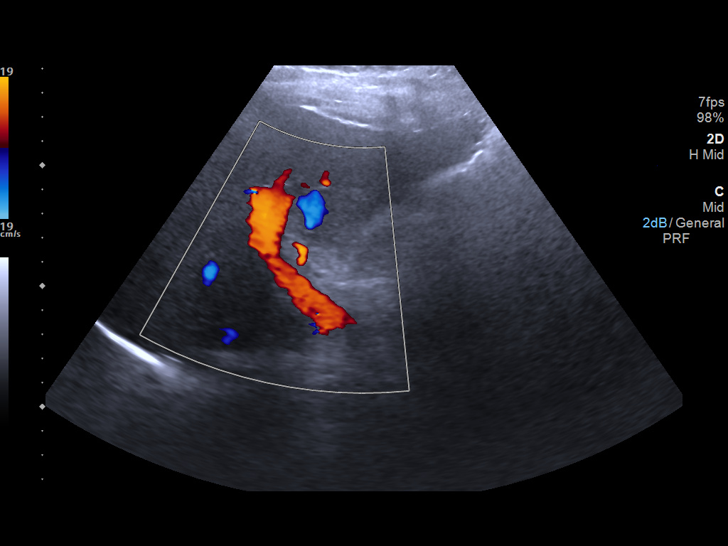

[14 of 25 positions shown; findings below may reference images not displayed]

FINDINGS: Gallbladder:

No gallstones or wall thickening visualized. No sonographic Murphy
sign noted by sonographer.

Common bile duct:

Diameter: 2.7 mm

Liver:

No focal lesion identified. Increased echogenicity within the patent
parenchyma, suggesting steatosis. Portal vein is patent on color
Doppler imaging with normal direction of blood flow towards the
liver.
IMPRESSION: 1. Normal sonographic appearance of the gallbladder. No
cholelithiasis, acute cholecystitis, or biliary dilatation.
2. Mild hepatic steatosis.

## 2020-01-01 IMAGING — CT CT ABD-PELV W/ CM
2 of 5 series · 16 of 46 positions shown, 18 images · IV contrast (APPLIED)
Comparison: 04/18/2007.

CLINICAL DATA: Epigastric pain radiating to the right side of the
back. Nausea and vomiting.

EXAM:
CT ABDOMEN AND PELVIS WITH CONTRAST
TECHNIQUE: Multidetector CT imaging of the abdomen and pelvis was performed
using the standard protocol following bolus administration of
intravenous contrast.
CONTRAST:  80mL OMNIPAQUE IOHEXOL 300 MG/ML  SOLN

[Series 3: abd/ pelvis 5.0 i30f 2 · axial · 0.94mm/px · z∈[+1053,+1478]mm · 13 of 95 slices shown, 15 images]
[im 5/95  soft-tissue]
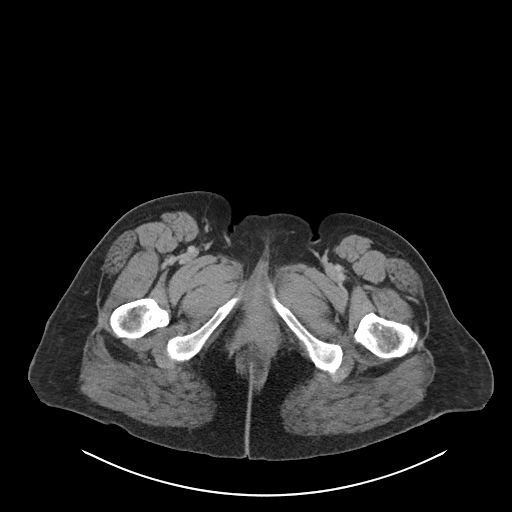
[im 5/95  bone]
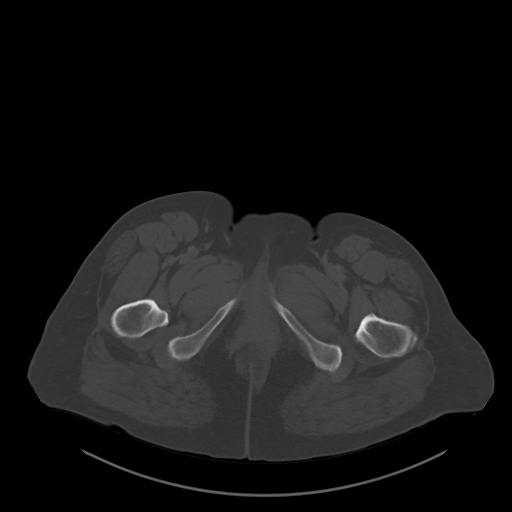
[im 15/95  soft-tissue]
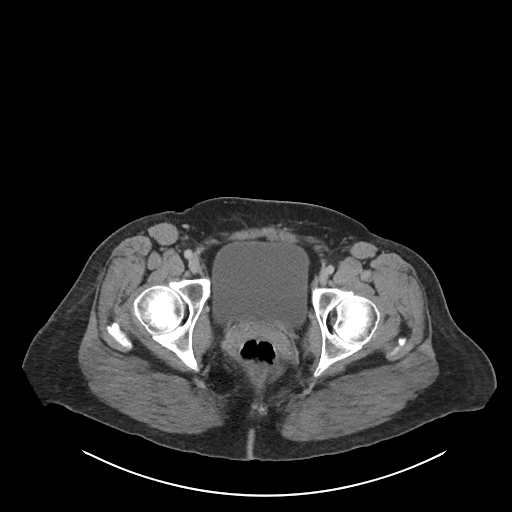
[im 20/95  soft-tissue]
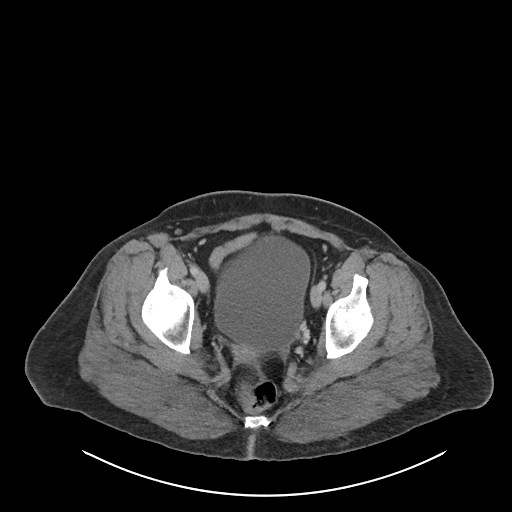
[im 25/95  soft-tissue]
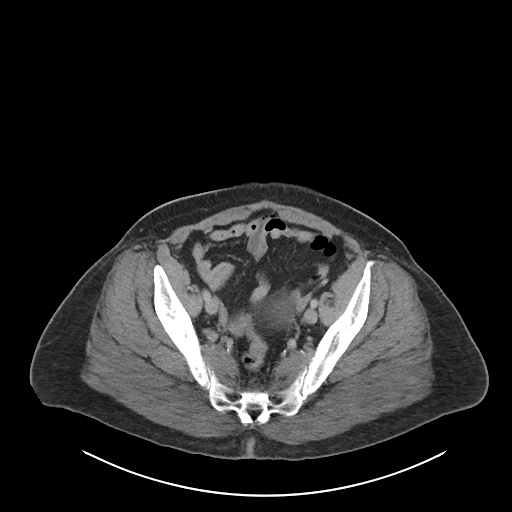
[im 35/95  soft-tissue]
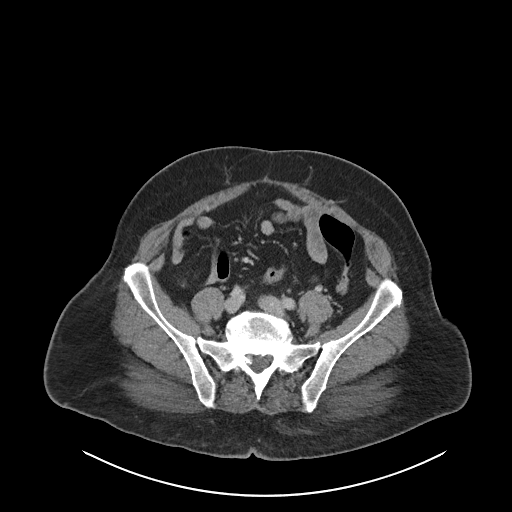
[im 40/95  soft-tissue]
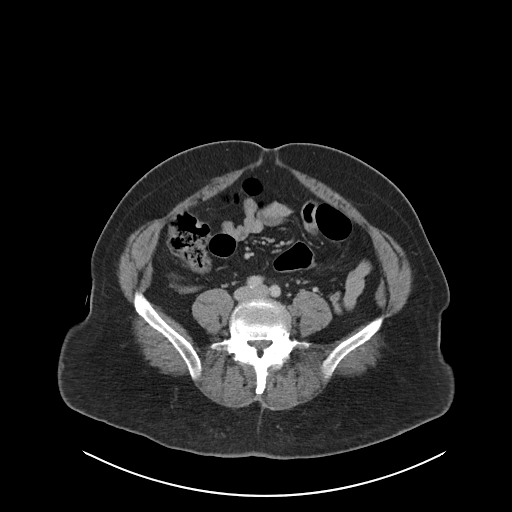
[im 50/95  soft-tissue]
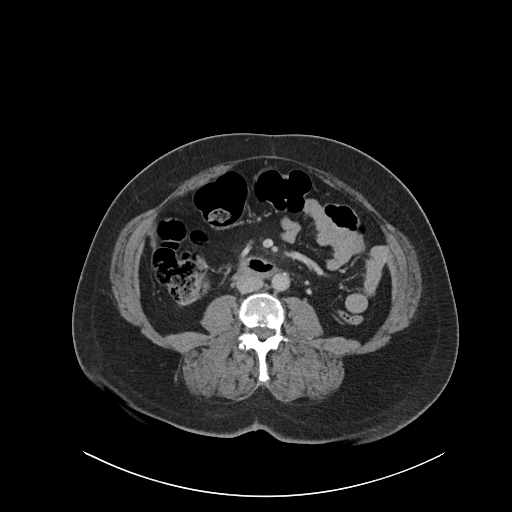
[im 55/95  soft-tissue]
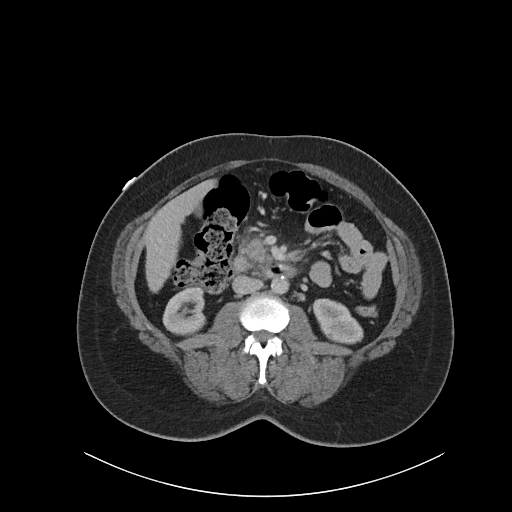
[im 60/95  soft-tissue]
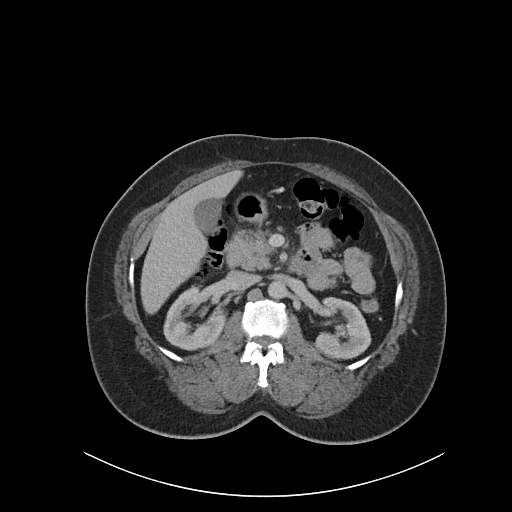
[im 60/95  bone]
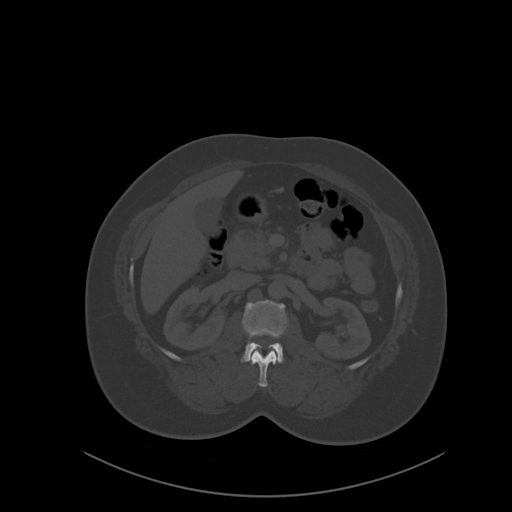
[im 70/95  soft-tissue]
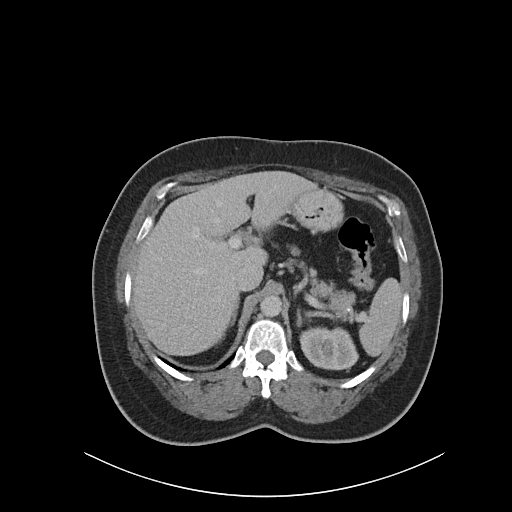
[im 75/95  soft-tissue]
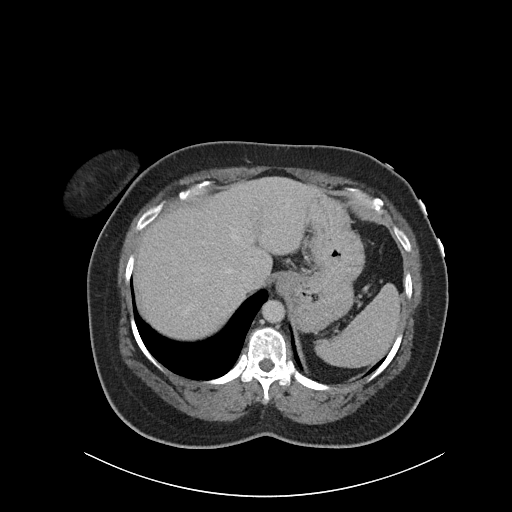
[im 80/95  soft-tissue]
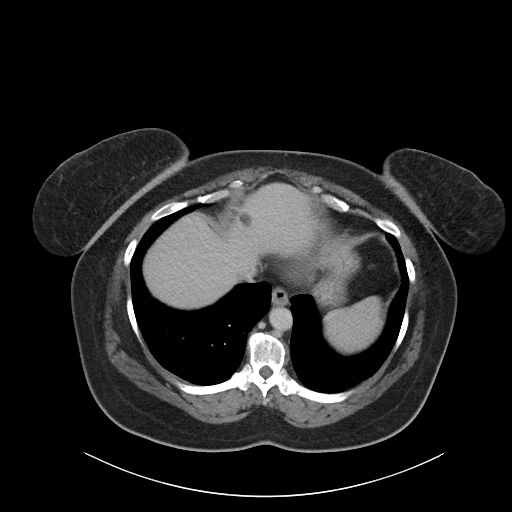
[im 90/95  soft-tissue]
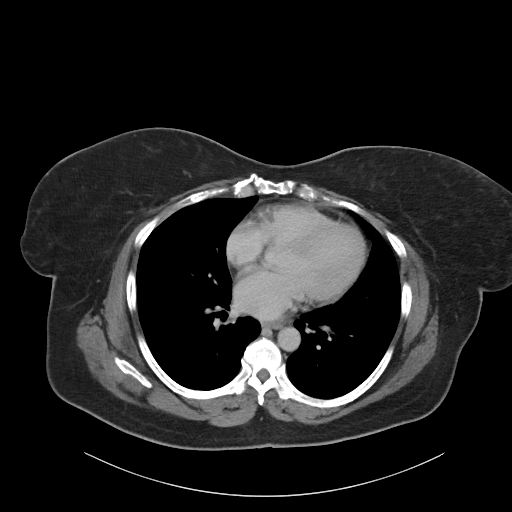

[Series 6: coronal soft tissue · coronal · 0.88mm/px · 3 of 100 slices shown]
[im 34/100  soft-tissue]
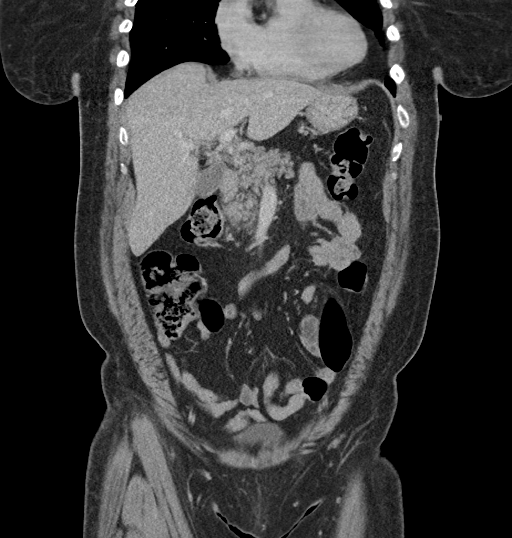
[im 45/100  soft-tissue]
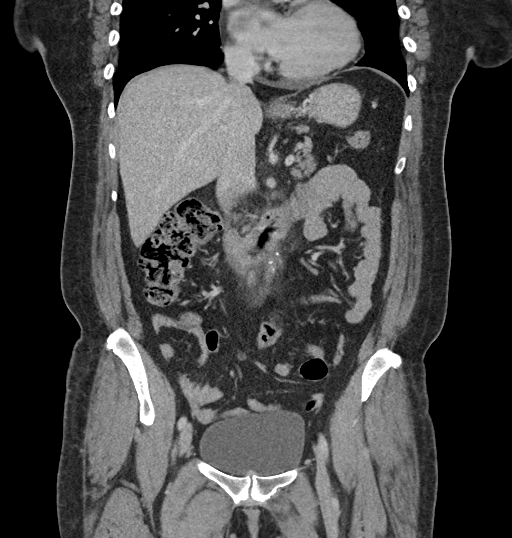
[im 56/100  soft-tissue]
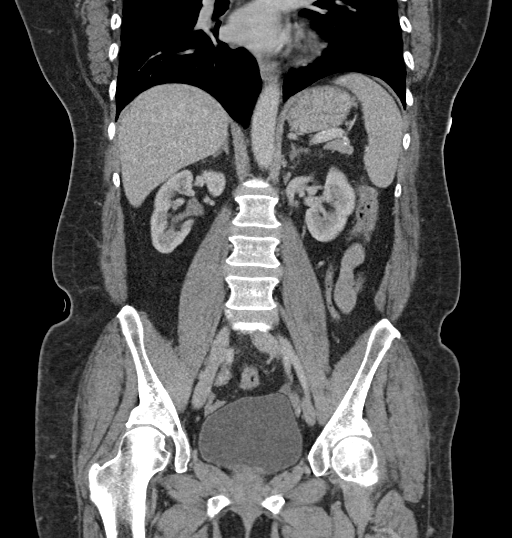

[16 of 46 positions shown; findings below may reference images not displayed]

FINDINGS: Lower chest: Unremarkable.

Hepatobiliary: 8 mm low-density lesion in the dome of the left liver
is too small to characterize but likely a cyst. Liver otherwise
unremarkable. There is no evidence for gallstones, gallbladder wall
thickening, or pericholecystic fluid. No intrahepatic or
extrahepatic biliary dilation.

Pancreas: Subtle edema/inflammation is seen around the head of
pancreas and uncinate process (axial image 41/series 3). No
dilatation of the main pancreatic duct. Fullness noted in the
parenchyma of the pancreatic head, presumably edema.

Spleen: No splenomegaly. No focal mass lesion.

Adrenals/Urinary Tract: No adrenal nodule or mass. Tiny hypodensity
in the interpolar right kidney is too small to characterize. Left
kidney unremarkable. No evidence for hydroureter. The urinary
bladder appears normal for the degree of distention.

Stomach/Bowel: Stomach is nondistended. No gastric wall thickening.
No evidence of outlet obstruction. There is some edema along the
medial wall of the descending duodenum and around the proximal
transverse segment. No small bowel wall thickening. No small bowel
dilatation. The terminal ileum is normal. The appendix is not
visualized, but there is no edema or inflammation in the region of
the cecum. No gross colonic mass. No colonic wall thickening.

Vascular/Lymphatic: There is abdominal aortic atherosclerosis
without aneurysm. There is no gastrohepatic or hepatoduodenal
ligament lymphadenopathy. No intraperitoneal or retroperitoneal
lymphadenopathy. No pelvic sidewall lymphadenopathy..

Reproductive: Uterus surgically absent.  There is no adnexal mass.

Other: No intraperitoneal free fluid.

Musculoskeletal: No worrisome lytic or sclerotic osseous
abnormality.
IMPRESSION: 1. Subtle edema/inflammation around the head of pancreas and
uncinate process with fullness in the pancreatic head. Although
there is some edema adjacent to the descending and proximal
transverse duodenum, pancreas appears to be the epicenter of the
changes and acute pancreatitis is favored over duodenitis. Soft
tissue fullness in the head of pancreas likely reflects edema in the
parenchyma, but follow-up MRI of the abdomen without and with
contrast after resolution of acute symptoms recommended. No
dilatation of the main pancreatic duct. No evidence for pancreatic
necrosis or pseudocyst at this time.
2.  Aortic Atherosclerois ([id number]-[id number])

## 2020-01-22 ENCOUNTER — Other Ambulatory Visit: Payer: Self-pay | Admitting: Emergency Medicine

## 2020-01-22 DIAGNOSIS — E039 Hypothyroidism, unspecified: Secondary | ICD-10-CM

## 2020-01-22 DIAGNOSIS — K219 Gastro-esophageal reflux disease without esophagitis: Secondary | ICD-10-CM

## 2020-01-30 ENCOUNTER — Ambulatory Visit: Payer: Managed Care, Other (non HMO) | Admitting: Emergency Medicine

## 2020-04-21 ENCOUNTER — Other Ambulatory Visit: Payer: Self-pay | Admitting: Emergency Medicine

## 2020-04-21 DIAGNOSIS — K219 Gastro-esophageal reflux disease without esophagitis: Secondary | ICD-10-CM

## 2020-04-21 DIAGNOSIS — E039 Hypothyroidism, unspecified: Secondary | ICD-10-CM

## 2020-04-21 NOTE — Telephone Encounter (Signed)
Requested Prescriptions  Pending Prescriptions Disp Refills  . levothyroxine (SYNTHROID) 100 MCG tablet [Pharmacy Med Name: LEVOTHYROXINE 100 MCG TABLET] 90 tablet 0    Sig: TAKE ONE TABLET BY MOUTH DAILY WITH BREAKFAST     Endocrinology:  Hypothyroid Agents Failed - 04/21/2020 10:58 AM      Failed - TSH needs to be rechecked within 3 months after an abnormal result. Refill until TSH is due.      Failed - TSH in normal range and within 360 days    TSH  Date Value Ref Range Status  01/11/2018 7.030 (H) 0.450 - 4.500 uIU/mL Final         Passed - Valid encounter within last 12 months    Recent Outpatient Visits          8 months ago Essential hypertension   Primary [facility] at [facility], Ines Bloomer, MD   1 year ago Abdominal pain, epigastric   [facility] at [facility], Ranell Patrick, MD   1 year ago Essential hypertension   Primary Care at [facility], Ines Bloomer, MD   2 years ago Essential hypertension   Primary Care at [place], [place]   2 years ago Acute bronchitis, unspecified organism   Primary Care at [facility], [place], [place]      Future Appointments            In 3 days Sagardia, Ines Bloomer, MD Primary Care at [place], [facility]           . omeprazole (PRILOSEC) 20 MG capsule [Pharmacy Med Name: OMEPRAZOLE DR 20 MG CAPSULE] 90 capsule 0    Sig: TAKE ONE CAPSULE BY MOUTH DAILY     Gastroenterology: Proton Pump Inhibitors Passed - 04/21/2020 10:58 AM      Passed - Valid encounter within last 12 months    Recent Outpatient Visits          8 months ago Essential hypertension   Primary Care at [facility], Ines Bloomer, MD   1 year ago Abdominal pain, epigastric   [facility] at [facility], Ranell Patrick, MD   1 year ago Essential hypertension   Primary Care at [facility], Ines Bloomer, MD   2 years ago Essential hypertension   Primary Care at [place], [place]   2 years ago Acute bronchitis, unspecified organism   Primary Care at [facility], [place], [place]      Future Appointments            In 3 days Sagardia, Ines Bloomer, MD Primary Care at [place], [facility]

## 2020-04-24 ENCOUNTER — Encounter: Payer: Self-pay | Admitting: Emergency Medicine

## 2020-04-24 ENCOUNTER — Other Ambulatory Visit: Payer: Self-pay

## 2020-04-24 ENCOUNTER — Ambulatory Visit: Payer: Managed Care, Other (non HMO) | Admitting: Emergency Medicine

## 2020-04-24 VITALS — BP 146/80 | HR 94 | Temp 98.3°F | Ht 66.0 in | Wt 206.2 lb

## 2020-04-24 DIAGNOSIS — E785 Hyperlipidemia, unspecified: Secondary | ICD-10-CM | POA: Diagnosis not present

## 2020-04-24 DIAGNOSIS — K219 Gastro-esophageal reflux disease without esophagitis: Secondary | ICD-10-CM

## 2020-04-24 DIAGNOSIS — E039 Hypothyroidism, unspecified: Secondary | ICD-10-CM | POA: Diagnosis not present

## 2020-04-24 DIAGNOSIS — N1832 Chronic kidney disease, stage 3b: Secondary | ICD-10-CM

## 2020-04-24 DIAGNOSIS — I10 Essential (primary) hypertension: Secondary | ICD-10-CM | POA: Diagnosis not present

## 2020-04-24 MED ORDER — LEVOTHYROXINE SODIUM 100 MCG PO TABS: 100.0000 ug | ORAL_TABLET | Freq: Every day | ORAL | 1 refills | Status: DC

## 2020-04-24 MED ORDER — LISINOPRIL-HYDROCHLOROTHIAZIDE 20-12.5 MG PO TABS: 1.0000 | ORAL_TABLET | Freq: Every day | ORAL | 1 refills | Status: DC

## 2020-04-24 MED ORDER — OMEPRAZOLE 20 MG PO CPDR: 20.0000 mg | DELAYED_RELEASE_CAPSULE | Freq: Every day | ORAL | 1 refills | Status: DC

## 2020-04-24 MED ORDER — ROSUVASTATIN CALCIUM 20 MG PO TABS: 20.0000 mg | ORAL_TABLET | Freq: Every day | ORAL | 1 refills | Status: DC

## 2020-04-24 NOTE — Progress Notes (Signed)
Erika Cervantes 64 y.o.   Chief Complaint  Patient presents with  . Arthritis    hands and legs   . Hypertension    f/u   . thyroid. med mangement    should she keep taking this type of med. (high TSH 2019)    HISTORY OF PRESENT ILLNESS: This is a 64 y.o. female with history of hypertension here for follow-up and medication refills. Presently taking Zestoretic 20-12.5 mg daily.  States blood pressure readings at home are normal. Also has a history of thyroid disorder on Synthroid 100 mcg daily.  Needs blood work today. Has history of osteoarthritis mostly hands and legs.  Takes NSAIDs regularly.  Advised not to abuse NSAIDs due to her chronic kidney disease status. Has history of dyslipidemia on rosuvastatin 20 mg daily. Feels "drained and tired" sometimes. BP Readings from Last 3 Encounters:  04/24/20 (!) 146/80  08/01/19 (!) 152/73  11/08/18 (!) 137/51    HPI   Prior to Admission medications   Medication Sig Start Date End Date Taking? Authorizing Provider  acetaminophen (TYLENOL) 650 MG CR tablet Take 650 mg by mouth every 8 (eight) hours as needed for pain.   Yes [provider]  levothyroxine (SYNTHROID) 100 MCG tablet TAKE ONE TABLET BY MOUTH DAILY WITH BREAKFAST 04/21/20  Yes Ercia Crisafulli, Eilleen Kempf, MD  lisinopril-hydrochlorothiazide (ZESTORETIC) 20-12.5 MG tablet Take 1 tablet by mouth daily. 08/01/19  Yes Georgina Quint, MD  omeprazole (PRILOSEC) 20 MG capsule TAKE ONE CAPSULE BY MOUTH DAILY 04/21/20  Yes Sukhman Martine, Eilleen Kempf, MD  rosuvastatin (CRESTOR) 20 MG tablet Take 1 tablet (20 mg total) by mouth daily. 08/01/19  Yes Joanie Duprey, Eilleen Kempf, MD  albuterol (PROVENTIL HFA;VENTOLIN HFA) 108 (90 Base) MCG/ACT inhaler Inhale 2 puffs into the lungs every 6 (six) hours as needed. Patient not taking: Reported on 04/24/2020 12/07/17   Wallis Bamberg, PA-C  ondansetron (ZOFRAN ODT) 4 MG disintegrating tablet Take 1 tablet (4 mg total) by mouth every 8 (eight) hours as  needed for nausea or vomiting. Patient not taking: Reported on 08/01/2019 11/04/18   Shade Flood, MD    Allergies  Allergen Reactions  . Darvocet [Propoxyphene N-Acetaminophen] Nausea And Vomiting    Patient Active Problem List   Diagnosis Date Noted  . Dyslipidemia 08/01/2019  . Degenerative arthritis of thumb 09/04/2015  . Vitamin D deficiency 05/26/2012  . Hypothyroidism 02/26/2012  . Hyperlipidemia with target LDL less than 100 02/26/2012  . HTN (hypertension) 02/26/2012  . Obesity (BMI 30.0-34.9) 02/26/2012  . Bulging lumbar disc 02/26/2012  . GERD (gastroesophageal reflux disease) 02/26/2012    Past Medical History:  Diagnosis Date  . Allergy   . GERD (gastroesophageal reflux disease)   . Hypertension   . PONV (postoperative nausea and vomiting)   . Thyroid disease     Past Surgical History:  Procedure Laterality Date  . ABDOMINAL HYSTERECTOMY  late 1980s   Fibroids were indication.  pt says also had SPO bil.    . APPENDECTOMY  late 1980s   done at time of hysto.    Marland Kitchen BREAST SURGERY     Right breast mass  . ESOPHAGOGASTRODUODENOSCOPY (EGD) WITH PROPOFOL N/A 11/08/2018   Procedure: ESOPHAGOGASTRODUODENOSCOPY (EGD) WITH PROPOFOL;  Surgeon: Rachael Fee, MD;  Location: [facility]COPY;  Service: Endoscopy;  Laterality: N/A;  . Hammer toes    . TONSILLECTOMY      Social History   Socioeconomic History  . Marital status: Married    Spouse name:  Not on file  . Number of children: Not on file  . Years of education: Not on file  . Highest education level: Not on file  Occupational History  . Not on file  Tobacco Use  . Smoking status: Current Every Day Smoker    Last attempt to quit: 08/22/1997    Years since quitting: 22.6  . Smokeless tobacco: Never Used  Vaping Use  . Vaping Use: Never used  Substance and Sexual Activity  . Alcohol use: No  . Drug use: No  . Sexual activity: Not on file  Other Topics Concern  . Not on file  Social History  Narrative  . Not on file   Social Determinants of Health   Financial Resource Strain:   . Difficulty of Paying Living Expenses:   Food Insecurity:   . Worried About Charity fundraiser in the Last Year:   . Arboriculturist in the Last Year:   Transportation Needs:   . Film/video editor (Medical):   Marland Kitchen Lack of Transportation (Non-Medical):   Physical Activity:   . Days of Exercise per Week:   . Minutes of Exercise per Session:   Stress:   . Feeling of Stress :   Social Connections:   . Frequency of Communication with Friends and Family:   . Frequency of Social Gatherings with Friends and Family:   . Attends Religious Services:   . Active Member of Clubs or Organizations:   . Attends Archivist Meetings:   Marland Kitchen Marital Status:   Intimate Partner Violence:   . Fear of Current or Ex-Partner:   . Emotionally Abused:   Marland Kitchen Physically Abused:   . Sexually Abused:     Family History  Problem Relation Age of Onset  . Heart disease Mother      Review of Systems  Constitutional: Negative.  Negative for chills and fever.  HENT: Negative.  Negative for congestion and sore throat.   Respiratory: Negative.  Negative for cough and shortness of breath.   Cardiovascular: Negative.  Negative for chest pain and palpitations.  Gastrointestinal: Negative.  Negative for abdominal pain, diarrhea, nausea and vomiting.  Genitourinary: Negative.  Negative for dysuria and hematuria.  Musculoskeletal: Positive for joint pain.  Skin: Negative.  Negative for rash.  Neurological: Negative.  Negative for dizziness and headaches.  All other systems reviewed and are negative.  Today's Vitals   04/24/20 1537 04/24/20 1544  BP: (!) 175/77 (!) 146/80  Pulse: 94   Temp: 98.3 F (36.8 C)   TempSrc: Temporal   SpO2: 96%   Weight: 206 lb 3.2 oz (93.5 kg)   Height: '5\' 6"'$  (1.676 m)    Body mass index is 33.28 kg/m.   Physical Exam Vitals reviewed.  Constitutional:      Appearance:  Normal appearance.  HENT:     Head: Normocephalic.  Eyes:     Extraocular Movements: Extraocular movements intact.     Conjunctiva/sclera: Conjunctivae normal.     Pupils: Pupils are equal, round, and reactive to light.  Cardiovascular:     Rate and Rhythm: Normal rate and regular rhythm.     Pulses: Normal pulses.     Heart sounds: Normal heart sounds.  Pulmonary:     Effort: Pulmonary effort is normal.     Breath sounds: Normal breath sounds.  Abdominal:     General: There is no distension.     Palpations: Abdomen is soft.     Tenderness: There  is no abdominal tenderness.  Musculoskeletal:        General: No tenderness or deformity. Normal range of motion.     Cervical back: Normal range of motion and neck supple.     Right lower leg: No edema.     Left lower leg: No edema.  Skin:    General: Skin is warm and dry.     Capillary Refill: Capillary refill takes less than 2 seconds.  Neurological:     General: No focal deficit present.     Mental Status: She is alert and oriented to person, place, and time.  Psychiatric:        Mood and Affect: Mood normal.        Behavior: Behavior normal.    A total of 30 minutes was spent with the patient, greater than 50% of which was in counseling/coordination of care regarding multiple chronic medical conditions and cardiovascular risks associated with these, review of all medications, review of most recent office visit notes, review of most recent blood work results, advised about chronic kidney disease and use of NSAIDs, importance of hydration and proper nutrition, prognosis and need for follow-up.   ASSESSMENT & PLAN: Najat was seen today for arthritis, hypertension and thyroid. med mangement.  Diagnoses and all orders for this visit:  Stage 3b chronic kidney disease  Hypothyroidism, unspecified type -     levothyroxine (SYNTHROID) 100 MCG tablet; Take 1 tablet (100 mcg total) by mouth daily with breakfast. -     Thyroid Panel  With TSH  Essential hypertension -     lisinopril-hydrochlorothiazide (ZESTORETIC) 20-12.5 MG tablet; Take 1 tablet by mouth daily. -     CMP14+EGFR -     Hemoglobin A1c  Dyslipidemia -     rosuvastatin (CRESTOR) 20 MG tablet; Take 1 tablet (20 mg total) by mouth daily. -     Lipid panel  Gastroesophageal reflux disease without esophagitis -     omeprazole (PRILOSEC) 20 MG capsule; Take 1 capsule (20 mg total) by mouth daily.    Patient Instructions       If you have lab work done today you will be contacted with your lab results within the next 2 weeks.  If you have not heard from [place] then please contact us. The fastest way to get your results is to register for My Chart.   IF you received an x-ray today, you will receive an invoice from [facility]. Please contact [facility] at [phone] with questions or concerns regarding your invoice.   IF you received labwork today, you will receive an invoice from [facility]. Please contact [facility] at [phone] with questions or concerns regarding your invoice.   Our billing staff will not be able to assist you with questions regarding bills from these companies.  You will be contacted with the lab results as soon as they are available. The fastest way to get your results is to activate your My Chart account. Instructions are located on the last page of this paperwork. If you have not heard from [place] regarding the results in 2 weeks, please contact this office.      Health Maintenance, Female Adopting a healthy lifestyle and getting preventive care are important in promoting health and wellness. Ask your health care provider about:  The right schedule for you to have regular tests and exams.  Things you can do on your own to prevent diseases and keep yourself healthy. What should I know about diet, weight, and  exercise? Eat a healthy diet   Eat a diet that includes plenty of vegetables, fruits, low-fat dairy  products, and lean protein.  Do not eat a lot of foods that are high in solid fats, added sugars, or sodium. Maintain a healthy weight Body mass index (BMI) is used to identify weight problems. It estimates body fat based on height and weight. Your health care provider can help determine your BMI and help you achieve or maintain a healthy weight. Get regular exercise Get regular exercise. This is one of the most important things you can do for your health. Most adults should:  Exercise for at least 150 minutes each week. The exercise should increase your heart rate and make you sweat (moderate-intensity exercise).  Do strengthening exercises at least twice a week. This is in addition to the moderate-intensity exercise.  Spend less time sitting. Even light physical activity can be beneficial. Watch cholesterol and blood lipids Have your blood tested for lipids and cholesterol at 64 years of age, then have this test every 5 years. Have your cholesterol levels checked more often if:  Your lipid or cholesterol levels are high.  You are older than 64 years of age.  You are at high risk for heart disease. What should I know about cancer screening? Depending on your health history and family history, you may need to have cancer screening at various ages. This may include screening for:  Breast cancer.  Cervical cancer.  Colorectal cancer.  Skin cancer.  Lung cancer. What should I know about heart disease, diabetes, and high blood pressure? Blood pressure and heart disease  High blood pressure causes heart disease and increases the risk of stroke. This is more likely to develop in people who have high blood pressure readings, are of African descent, or are overweight.  Have your blood pressure checked: ? Every 3-5 years if you are 64-76 years of age. ? Every year if you are 41 years old or older. Diabetes Have regular diabetes screenings. This checks your fasting blood sugar level.  Have the screening done:  Once every three years after age 37 if you are at a normal weight and have a low risk for diabetes.  More often and at a younger age if you are overweight or have a high risk for diabetes. What should I know about preventing infection? Hepatitis B If you have a higher risk for hepatitis B, you should be screened for this virus. Talk with your health care provider to find out if you are at risk for hepatitis B infection. Hepatitis C Testing is recommended for:  Everyone born from [birth year] through 1965.  Anyone with known risk factors for hepatitis C. Sexually transmitted infections (STIs)  Get screened for STIs, including gonorrhea and chlamydia, if: ? You are sexually active and are younger than 64 years of age. ? You are older than 64 years of age and your health care provider tells you that you are at risk for this type of infection. ? Your sexual activity has changed since you were last screened, and you are at increased risk for chlamydia or gonorrhea. Ask your health care provider if you are at risk.  Ask your health care provider about whether you are at high risk for HIV. Your health care provider may recommend a prescription medicine to help prevent HIV infection. If you choose to take medicine to prevent HIV, you should first get tested for HIV. You should then be tested every 3 months for  as long as you are taking the medicine. Pregnancy  If you are about to stop having your period (premenopausal) and you may become pregnant, seek counseling before you get pregnant.  Take 400 to 800 micrograms (mcg) of folic acid every day if you become pregnant.  Ask for birth control (contraception) if you want to prevent pregnancy. Osteoporosis and menopause Osteoporosis is a disease in which the bones lose minerals and strength with aging. This can result in bone fractures. If you are 38 years old or older, or if you are at risk for osteoporosis and fractures, ask  your health care provider if you should:  Be screened for bone loss.  Take a calcium or vitamin D supplement to lower your risk of fractures.  Be given hormone replacement therapy (HRT) to treat symptoms of menopause. Follow these instructions at home: Lifestyle  Do not use any products that contain nicotine or tobacco, such as cigarettes, e-cigarettes, and chewing tobacco. If you need help quitting, ask your health care provider.  Do not use street drugs.  Do not share needles.  Ask your health care provider for help if you need support or information about quitting drugs. Alcohol use  Do not drink alcohol if: ? Your health care provider tells you not to drink. ? You are pregnant, may be pregnant, or are planning to become pregnant.  If you drink alcohol: ? Limit how much you use to 0-1 drink a day. ? Limit intake if you are breastfeeding.  Be aware of how much alcohol is in your drink. In the [place] one drink equals one 12 oz bottle of beer (355 mL), one 5 oz glass of wine (148 mL), or one 1 oz glass of hard liquor (44 mL). General instructions  Schedule regular health, dental, and eye exams.  Stay current with your vaccines.  Tell your health care provider if: ? You often feel depressed. ? You have ever been abused or do not feel safe at home. Summary  Adopting a healthy lifestyle and getting preventive care are important in promoting health and wellness.  Follow your health care provider's instructions about healthy diet, exercising, and getting tested or screened for diseases.  Follow your health care provider's instructions on monitoring your cholesterol and blood pressure. This information is not intended to replace advice given to you by your health care provider. Make sure you discuss any questions you have with your health care provider. Document Revised: 10/05/2018 Document Reviewed: 10/05/2018 Elsevier Patient Education  [place].  Hypertension,  Adult High blood pressure (hypertension) is when the force of blood pumping through the arteries is too strong. The arteries are the blood vessels that carry blood from the heart throughout the body. Hypertension forces the heart to work harder to pump blood and may cause arteries to become narrow or stiff. Untreated or uncontrolled hypertension can cause a heart attack, heart failure, a stroke, kidney disease, and other problems. A blood pressure reading consists of a higher number over a lower number. Ideally, your blood pressure should be below 120/80. The first ("top") number is called the systolic pressure. It is a measure of the pressure in your arteries as your heart beats. The second ("bottom") number is called the diastolic pressure. It is a measure of the pressure in your arteries as the heart relaxes. What are the causes? The exact cause of this condition is not known. There are some conditions that result in or are related to high blood pressure. What  increases the risk? Some risk factors for high blood pressure are under your control. The following factors may make you more likely to develop this condition:  Smoking.  Having type 2 diabetes mellitus, high cholesterol, or both.  Not getting enough exercise or physical activity.  Being overweight.  Having too much fat, sugar, calories, or salt (sodium) in your diet.  Drinking too much alcohol. Some risk factors for high blood pressure may be difficult or impossible to change. Some of these factors include:  Having chronic kidney disease.  Having a family history of high blood pressure.  Age. Risk increases with age.  Race. You may be at higher risk if you are African American.  Gender. Men are at higher risk than women before age 34. After age 53, women are at higher risk than men.  Having obstructive sleep apnea.  Stress. What are the signs or symptoms? High blood pressure may not cause symptoms. Very high blood  pressure (hypertensive crisis) may cause:  Headache.  Anxiety.  Shortness of breath.  Nosebleed.  Nausea and vomiting.  Vision changes.  Severe chest pain.  Seizures. How is this diagnosed? This condition is diagnosed by measuring your blood pressure while you are seated, with your arm resting on a flat surface, your legs uncrossed, and your feet flat on the floor. The cuff of the blood pressure monitor will be placed directly against the skin of your upper arm at the level of your heart. It should be measured at least twice using the same arm. Certain conditions can cause a difference in blood pressure between your right and left arms. Certain factors can cause blood pressure readings to be lower or higher than normal for a short period of time:  When your blood pressure is higher when you are in a health care provider's office than when you are at home, this is called white coat hypertension. Most people with this condition do not need medicines.  When your blood pressure is higher at home than when you are in a health care provider's office, this is called masked hypertension. Most people with this condition may need medicines to control blood pressure. If you have a high blood pressure reading during one visit or you have normal blood pressure with other risk factors, you may be asked to:  Return on a different day to have your blood pressure checked again.  Monitor your blood pressure at home for 1 week or longer. If you are diagnosed with hypertension, you may have other blood or imaging tests to help your health care provider understand your overall risk for other conditions. How is this treated? This condition is treated by making healthy lifestyle changes, such as eating healthy foods, exercising more, and reducing your alcohol intake. Your health care provider may prescribe medicine if lifestyle changes are not enough to get your blood pressure under control, and if:  Your  systolic blood pressure is above 130.  Your diastolic blood pressure is above 80. Your personal target blood pressure may vary depending on your medical conditions, your age, and other factors. Follow these instructions at home: Eating and drinking   Eat a diet that is high in fiber and potassium, and low in sodium, added sugar, and fat. An example eating plan is called the DASH (Dietary Approaches to Stop Hypertension) diet. To eat this way: ? Eat plenty of fresh fruits and vegetables. Try to fill one half of your plate at each meal with fruits and vegetables. ?  Eat whole grains, such as whole-wheat pasta, brown rice, or whole-grain bread. Fill about one fourth of your plate with whole grains. ? Eat or drink low-fat dairy products, such as skim milk or low-fat yogurt. ? Avoid fatty cuts of meat, processed or cured meats, and poultry with skin. Fill about one fourth of your plate with lean proteins, such as fish, chicken without skin, beans, eggs, or tofu. ? Avoid pre-made and processed foods. These tend to be higher in sodium, added sugar, and fat.  Reduce your daily sodium intake. Most people with hypertension should eat less than 1,500 mg of sodium a day.  Do not drink alcohol if: ? Your health care provider tells you not to drink. ? You are pregnant, may be pregnant, or are planning to become pregnant.  If you drink alcohol: ? Limit how much you use to:  0-1 drink a day for women.  0-2 drinks a day for men. ? Be aware of how much alcohol is in your drink. In the [place] one drink equals one 12 oz bottle of beer (355 mL), one 5 oz glass of wine (148 mL), or one 1 oz glass of hard liquor (44 mL). Lifestyle   Work with your health care provider to maintain a healthy body weight or to lose weight. Ask what an ideal weight is for you.  Get at least 30 minutes of exercise most days of the week. Activities may include walking, swimming, or biking.  Include exercise to strengthen your  muscles (resistance exercise), such as Pilates or lifting weights, as part of your weekly exercise routine. Try to do these types of exercises for 30 minutes at least 3 days a week.  Do not use any products that contain nicotine or tobacco, such as cigarettes, e-cigarettes, and chewing tobacco. If you need help quitting, ask your health care provider.  Monitor your blood pressure at home as told by your health care provider.  Keep all follow-up visits as told by your health care provider. This is important. Medicines  Take over-the-counter and prescription medicines only as told by your health care provider. Follow directions carefully. Blood pressure medicines must be taken as prescribed.  Do not skip doses of blood pressure medicine. Doing this puts you at risk for problems and can make the medicine less effective.  Ask your health care provider about side effects or reactions to medicines that you should watch for. Contact a health care provider if you:  Think you are having a reaction to a medicine you are taking.  Have headaches that keep coming back (recurring).  Feel dizzy.  Have swelling in your ankles.  Have trouble with your vision. Get help right away if you:  Develop a severe headache or confusion.  Have unusual weakness or numbness.  Feel faint.  Have severe pain in your chest or abdomen.  Vomit repeatedly.  Have trouble breathing. Summary  Hypertension is when the force of blood pumping through your arteries is too strong. If this condition is not controlled, it may put you at risk for serious complications.  Your personal target blood pressure may vary depending on your medical conditions, your age, and other factors. For most people, a normal blood pressure is less than 120/80.  Hypertension is treated with lifestyle changes, medicines, or a combination of both. Lifestyle changes include losing weight, eating a healthy, low-sodium diet, exercising more, and  limiting alcohol. This information is not intended to replace advice given to you by your health  care provider. Make sure you discuss any questions you have with your health care provider. Document Revised: 06/22/2018 Document Reviewed: 06/22/2018 Elsevier Patient Education  2020 [employer] Inc[employer]      Agustina Caroli, MD [facility]

## 2020-04-24 NOTE — Patient Instructions (Addendum)
   If you have lab work done today you will be contacted with your lab results within the next 2 weeks.  If you have not heard from us then please contact us. The fastest way to get your results is to register for My Chart.   IF you received an x-ray today, you will receive an invoice from [facility]. Please contact [facility] at [phone] with questions or concerns regarding your invoice.   IF you received labwork today, you will receive an invoice from [facility]. Please contact [facility] at [phone] with questions or concerns regarding your invoice.   Our billing staff will not be able to assist you with questions regarding bills from these companies.  You will be contacted with the lab results as soon as they are available. The fastest way to get your results is to activate your My Chart account. Instructions are located on the last page of this paperwork. If you have not heard from us regarding the results in 2 weeks, please contact this office.      Health Maintenance, Female Adopting a healthy lifestyle and getting preventive care are important in promoting health and wellness. Ask your health care provider about:  The right schedule for you to have regular tests and exams.  Things you can do on your own to prevent diseases and keep yourself healthy. What should I know about diet, weight, and exercise? Eat a healthy diet   Eat a diet that includes plenty of vegetables, fruits, low-fat dairy products, and lean protein.  Do not eat a lot of foods that are high in solid fats, added sugars, or sodium. Maintain a healthy weight Body mass index (BMI) is used to identify weight problems. It estimates body fat based on height and weight. Your health care provider can help determine your BMI and help you achieve or maintain a healthy weight. Get regular exercise Get regular exercise. This is one of the most important things you can do for your health. Most  adults should:  Exercise for at least 150 minutes each week. The exercise should increase your heart rate and make you sweat (moderate-intensity exercise).  Do strengthening exercises at least twice a week. This is in addition to the moderate-intensity exercise.  Spend less time sitting. Even light physical activity can be beneficial. Watch cholesterol and blood lipids Have your blood tested for lipids and cholesterol at 64 years of age, then have this test every 5 years. Have your cholesterol levels checked more often if:  Your lipid or cholesterol levels are high.  You are older than 64 years of age.  You are at high risk for heart disease. What should I know about cancer screening? Depending on your health history and family history, you may need to have cancer screening at various ages. This may include screening for:  Breast cancer.  Cervical cancer.  Colorectal cancer.  Skin cancer.  Lung cancer. What should I know about heart disease, diabetes, and high blood pressure? Blood pressure and heart disease  High blood pressure causes heart disease and increases the risk of stroke. This is more likely to develop in people who have high blood pressure readings, are of African descent, or are overweight.  Have your blood pressure checked: ? Every 3-5 years if you are 18-39 years of age. ? Every year if you are 40 years old or older. Diabetes Have regular diabetes screenings. This checks your fasting blood sugar level. Have the screening done:  Once every   three years after age 40 if you are at a normal weight and have a low risk for diabetes.  More often and at a younger age if you are overweight or have a high risk for diabetes. What should I know about preventing infection? Hepatitis B If you have a higher risk for hepatitis B, you should be screened for this virus. Talk with your health care provider to find out if you are at risk for hepatitis B infection. Hepatitis  C Testing is recommended for:  Everyone born from 1945 through 1965.  Anyone with known risk factors for hepatitis C. Sexually transmitted infections (STIs)  Get screened for STIs, including gonorrhea and chlamydia, if: ? You are sexually active and are younger than 64 years of age. ? You are older than 64 years of age and your health care provider tells you that you are at risk for this type of infection. ? Your sexual activity has changed since you were last screened, and you are at increased risk for chlamydia or gonorrhea. Ask your health care provider if you are at risk.  Ask your health care provider about whether you are at high risk for HIV. Your health care provider may recommend a prescription medicine to help prevent HIV infection. If you choose to take medicine to prevent HIV, you should first get tested for HIV. You should then be tested every 3 months for as long as you are taking the medicine. Pregnancy  If you are about to stop having your period (premenopausal) and you may become pregnant, seek counseling before you get pregnant.  Take 400 to 800 micrograms (mcg) of folic acid every day if you become pregnant.  Ask for birth control (contraception) if you want to prevent pregnancy. Osteoporosis and menopause Osteoporosis is a disease in which the bones lose minerals and strength with aging. This can result in bone fractures. If you are 65 years old or older, or if you are at risk for osteoporosis and fractures, ask your health care provider if you should:  Be screened for bone loss.  Take a calcium or vitamin D supplement to lower your risk of fractures.  Be given hormone replacement therapy (HRT) to treat symptoms of menopause. Follow these instructions at home: Lifestyle  Do not use any products that contain nicotine or tobacco, such as cigarettes, e-cigarettes, and chewing tobacco. If you need help quitting, ask your health care provider.  Do not use street  drugs.  Do not share needles.  Ask your health care provider for help if you need support or information about quitting drugs. Alcohol use  Do not drink alcohol if: ? Your health care provider tells you not to drink. ? You are pregnant, may be pregnant, or are planning to become pregnant.  If you drink alcohol: ? Limit how much you use to 0-1 drink a day. ? Limit intake if you are breastfeeding.  Be aware of how much alcohol is in your drink. In the [place] one drink equals one 12 oz bottle of beer (355 mL), one 5 oz glass of wine (148 mL), or one 1 oz glass of hard liquor (44 mL). General instructions  Schedule regular health, dental, and eye exams.  Stay current with your vaccines.  Tell your health care provider if: ? You often feel depressed. ? You have ever been abused or do not feel safe at home. Summary  Adopting a healthy lifestyle and getting preventive care are important in promoting health and wellness.    Follow your health care provider's instructions about healthy diet, exercising, and getting tested or screened for diseases.  Follow your health care provider's instructions on monitoring your cholesterol and blood pressure. This information is not intended to replace advice given to you by your health care provider. Make sure you discuss any questions you have with your health care provider. Document Revised: 10/05/2018 Document Reviewed: 10/05/2018 Elsevier Patient Education  [place].  Hypertension, Adult High blood pressure (hypertension) is when the force of blood pumping through the arteries is too strong. The arteries are the blood vessels that carry blood from the heart throughout the body. Hypertension forces the heart to work harder to pump blood and may cause arteries to become narrow or stiff. Untreated or uncontrolled hypertension can cause a heart attack, heart failure, a stroke, kidney disease, and other problems. A blood pressure reading  consists of a higher number over a lower number. Ideally, your blood pressure should be below 120/80. The first ("top") number is called the systolic pressure. It is a measure of the pressure in your arteries as your heart beats. The second ("bottom") number is called the diastolic pressure. It is a measure of the pressure in your arteries as the heart relaxes. What are the causes? The exact cause of this condition is not known. There are some conditions that result in or are related to high blood pressure. What increases the risk? Some risk factors for high blood pressure are under your control. The following factors may make you more likely to develop this condition:  Smoking.  Having type 2 diabetes mellitus, high cholesterol, or both.  Not getting enough exercise or physical activity.  Being overweight.  Having too much fat, sugar, calories, or salt (sodium) in your diet.  Drinking too much alcohol. Some risk factors for high blood pressure may be difficult or impossible to change. Some of these factors include:  Having chronic kidney disease.  Having a family history of high blood pressure.  Age. Risk increases with age.  Race. You may be at higher risk if you are African American.  Gender. Men are at higher risk than women before age 71. After age 65, women are at higher risk than men.  Having obstructive sleep apnea.  Stress. What are the signs or symptoms? High blood pressure may not cause symptoms. Very high blood pressure (hypertensive crisis) may cause:  Headache.  Anxiety.  Shortness of breath.  Nosebleed.  Nausea and vomiting.  Vision changes.  Severe chest pain.  Seizures. How is this diagnosed? This condition is diagnosed by measuring your blood pressure while you are seated, with your arm resting on a flat surface, your legs uncrossed, and your feet flat on the floor. The cuff of the blood pressure monitor will be placed directly against the skin of  your upper arm at the level of your heart. It should be measured at least twice using the same arm. Certain conditions can cause a difference in blood pressure between your right and left arms. Certain factors can cause blood pressure readings to be lower or higher than normal for a short period of time:  When your blood pressure is higher when you are in a health care provider's office than when you are at home, this is called white coat hypertension. Most people with this condition do not need medicines.  When your blood pressure is higher at home than when you are in a health care provider's office, this is called masked hypertension.  Most people with this condition may need medicines to control blood pressure. If you have a high blood pressure reading during one visit or you have normal blood pressure with other risk factors, you may be asked to:  Return on a different day to have your blood pressure checked again.  Monitor your blood pressure at home for 1 week or longer. If you are diagnosed with hypertension, you may have other blood or imaging tests to help your health care provider understand your overall risk for other conditions. How is this treated? This condition is treated by making healthy lifestyle changes, such as eating healthy foods, exercising more, and reducing your alcohol intake. Your health care provider may prescribe medicine if lifestyle changes are not enough to get your blood pressure under control, and if:  Your systolic blood pressure is above 130.  Your diastolic blood pressure is above 80. Your personal target blood pressure may vary depending on your medical conditions, your age, and other factors. Follow these instructions at home: Eating and drinking   Eat a diet that is high in fiber and potassium, and low in sodium, added sugar, and fat. An example eating plan is called the DASH (Dietary Approaches to Stop Hypertension) diet. To eat this way: ? Eat plenty  of fresh fruits and vegetables. Try to fill one half of your plate at each meal with fruits and vegetables. ? Eat whole grains, such as whole-wheat pasta, brown rice, or whole-grain bread. Fill about one fourth of your plate with whole grains. ? Eat or drink low-fat dairy products, such as skim milk or low-fat yogurt. ? Avoid fatty cuts of meat, processed or cured meats, and poultry with skin. Fill about one fourth of your plate with lean proteins, such as fish, chicken without skin, beans, eggs, or tofu. ? Avoid pre-made and processed foods. These tend to be higher in sodium, added sugar, and fat.  Reduce your daily sodium intake. Most people with hypertension should eat less than 1,500 mg of sodium a day.  Do not drink alcohol if: ? Your health care provider tells you not to drink. ? You are pregnant, may be pregnant, or are planning to become pregnant.  If you drink alcohol: ? Limit how much you use to:  0-1 drink a day for women.  0-2 drinks a day for men. ? Be aware of how much alcohol is in your drink. In the [place] one drink equals one 12 oz bottle of beer (355 mL), one 5 oz glass of wine (148 mL), or one 1 oz glass of hard liquor (44 mL). Lifestyle   Work with your health care provider to maintain a healthy body weight or to lose weight. Ask what an ideal weight is for you.  Get at least 30 minutes of exercise most days of the week. Activities may include walking, swimming, or biking.  Include exercise to strengthen your muscles (resistance exercise), such as Pilates or lifting weights, as part of your weekly exercise routine. Try to do these types of exercises for 30 minutes at least 3 days a week.  Do not use any products that contain nicotine or tobacco, such as cigarettes, e-cigarettes, and chewing tobacco. If you need help quitting, ask your health care provider.  Monitor your blood pressure at home as told by your health care provider.  Keep all follow-up visits as told  by your health care provider. This is important. Medicines  Take over-the-counter and prescription medicines only as told by  your health care provider. Follow directions carefully. Blood pressure medicines must be taken as prescribed.  Do not skip doses of blood pressure medicine. Doing this puts you at risk for problems and can make the medicine less effective.  Ask your health care provider about side effects or reactions to medicines that you should watch for. Contact a health care provider if you:  Think you are having a reaction to a medicine you are taking.  Have headaches that keep coming back (recurring).  Feel dizzy.  Have swelling in your ankles.  Have trouble with your vision. Get help right away if you:  Develop a severe headache or confusion.  Have unusual weakness or numbness.  Feel faint.  Have severe pain in your chest or abdomen.  Vomit repeatedly.  Have trouble breathing. Summary  Hypertension is when the force of blood pumping through your arteries is too strong. If this condition is not controlled, it may put you at risk for serious complications.  Your personal target blood pressure may vary depending on your medical conditions, your age, and other factors. For most people, a normal blood pressure is less than 120/80.  Hypertension is treated with lifestyle changes, medicines, or a combination of both. Lifestyle changes include losing weight, eating a healthy, low-sodium diet, exercising more, and limiting alcohol. This information is not intended to replace advice given to you by your health care provider. Make sure you discuss any questions you have with your health care provider. Document Revised: 06/22/2018 Document Reviewed: 06/22/2018 Elsevier Patient Education  2020 [employer].

## 2020-04-25 LAB — THYROID PANEL WITH TSH
Free Thyroxine Index: 2.1 (ref 1.2–4.9)
T3 Uptake Ratio: 29 % (ref 24–39)
T4, Total: 7.1 ug/dL (ref 4.5–12.0)
TSH: 3.91 u[IU]/mL (ref 0.450–4.500)

## 2020-04-25 LAB — CMP14+EGFR
ALT: 9 IU/L (ref 0–32)
AST: 10 IU/L (ref 0–40)
Albumin/Globulin Ratio: 1.7 (ref 1.2–2.2)
Albumin: 4.3 g/dL (ref 3.8–4.8)
Alkaline Phosphatase: 49 IU/L (ref 48–121)
BUN/Creatinine Ratio: 16 (ref 12–28)
BUN: 17 mg/dL (ref 8–27)
Bilirubin Total: 0.5 mg/dL (ref 0.0–1.2)
CO2: 23 mmol/L (ref 20–29)
Calcium: 9.5 mg/dL (ref 8.7–10.3)
Chloride: 105 mmol/L (ref 96–106)
Creatinine, Ser: 1.09 mg/dL — ABNORMAL HIGH (ref 0.57–1.00)
GFR calc Af Amer: 62 mL/min/{1.73_m2} (ref >59)
GFR calc non Af Amer: 54 mL/min/{1.73_m2} — ABNORMAL LOW (ref >59)
Globulin, Total: 2.5 g/dL (ref 1.5–4.5)
Glucose: 85 mg/dL (ref 65–99)
Potassium: 3.3 mmol/L — ABNORMAL LOW (ref 3.5–5.2)
Sodium: 141 mmol/L (ref 134–144)
Total Protein: 6.8 g/dL (ref 6.0–8.5)

## 2020-04-25 LAB — HEMOGLOBIN A1C
Est. average glucose Bld gHb Est-mCnc: 134 mg/dL
Hgb A1c MFr Bld: 6.3 % — ABNORMAL HIGH (ref 4.8–5.6)

## 2020-04-25 LAB — LIPID PANEL
Chol/HDL Ratio: 3 ratio (ref 0.0–4.4)
Cholesterol, Total: 137 mg/dL (ref 100–199)
HDL: 46 mg/dL (ref >39)
LDL Chol Calc (NIH): 61 mg/dL (ref 0–99)
Triglycerides: 178 mg/dL — ABNORMAL HIGH (ref 0–149)
VLDL Cholesterol Cal: 30 mg/dL (ref 5–40)

## 2020-07-19 ENCOUNTER — Telehealth: Payer: Self-pay | Admitting: *Deleted

## 2020-07-19 NOTE — Telephone Encounter (Signed)
Schedule a mammogram [facility] 07-22-2020

## 2020-07-22 ENCOUNTER — Other Ambulatory Visit: Payer: Self-pay

## 2020-07-22 ENCOUNTER — Other Ambulatory Visit: Payer: Self-pay | Admitting: Emergency Medicine

## 2020-07-22 ENCOUNTER — Ambulatory Visit
Admission: RE | Admit: 2020-07-22 | Discharge: 2020-07-22 | Disposition: A | Discharge status: Home / Self Care | Payer: Managed Care, Other (non HMO) | Source: Ambulatory Visit | Attending: Emergency Medicine | Admitting: Emergency Medicine

## 2020-07-22 DIAGNOSIS — Z1231 Encounter for screening mammogram for malignant neoplasm of breast: Secondary | ICD-10-CM

## 2020-10-24 ENCOUNTER — Ambulatory Visit: Payer: Managed Care, Other (non HMO) | Admitting: Emergency Medicine

## 2020-10-24 ENCOUNTER — Other Ambulatory Visit: Payer: Self-pay

## 2020-10-24 ENCOUNTER — Other Ambulatory Visit: Payer: Self-pay | Admitting: Emergency Medicine

## 2020-10-24 DIAGNOSIS — E785 Hyperlipidemia, unspecified: Secondary | ICD-10-CM

## 2020-10-24 DIAGNOSIS — R112 Nausea with vomiting, unspecified: Secondary | ICD-10-CM

## 2020-10-24 DIAGNOSIS — I10 Essential (primary) hypertension: Secondary | ICD-10-CM

## 2020-10-24 DIAGNOSIS — K219 Gastro-esophageal reflux disease without esophagitis: Secondary | ICD-10-CM

## 2020-10-24 DIAGNOSIS — E039 Hypothyroidism, unspecified: Secondary | ICD-10-CM

## 2020-10-24 MED ORDER — ROSUVASTATIN CALCIUM 20 MG PO TABS: 20.0000 mg | ORAL_TABLET | Freq: Every day | ORAL | 1 refills | Status: DC

## 2020-10-24 MED ORDER — LEVOTHYROXINE SODIUM 100 MCG PO TABS: 100.0000 ug | ORAL_TABLET | Freq: Every day | ORAL | 1 refills | Status: DC

## 2020-10-24 MED ORDER — OMEPRAZOLE 20 MG PO CPDR: 20.0000 mg | DELAYED_RELEASE_CAPSULE | Freq: Every day | ORAL | 1 refills | Status: DC

## 2020-10-24 MED ORDER — LISINOPRIL-HYDROCHLOROTHIAZIDE 20-12.5 MG PO TABS: 1.0000 | ORAL_TABLET | Freq: Every day | ORAL | 1 refills | Status: DC

## 2020-10-24 NOTE — Telephone Encounter (Signed)
Pt is needing some of her medications refilled until hes can get into the office one 10/29/20 due to her provider being out of the office on her original appt on 12/30.She stated she is needing all her medications refilled. Please advise.   levothyroxine (SYNTHROID) 100 MCG tablet [[id number]]   lisinopril-hydrochlorothiazide (ZESTORETIC) 20-12.5 MG tablet [[id number]]   omeprazole (PRILOSEC) 20 MG capsule [[id number]]   ondansetron (ZOFRAN ODT) 4 MG disintegrating tablet [[id number]]   rosuvastatin (CRESTOR) 20 MG tablet [[id number]]

## 2020-10-24 NOTE — Telephone Encounter (Signed)
Called pt LVM at preferred number let her know to call us and reschedule her visit for today as Provider not available

## 2020-10-25 MED ORDER — ONDANSETRON 4 MG PO TBDP: 4.0000 mg | ORAL_TABLET | Freq: Three times a day (TID) | ORAL | 0 refills | Status: DC | PRN

## 2020-10-29 ENCOUNTER — Other Ambulatory Visit: Payer: Self-pay

## 2020-10-29 ENCOUNTER — Ambulatory Visit: Payer: Managed Care, Other (non HMO) | Admitting: Registered Nurse

## 2020-10-29 ENCOUNTER — Encounter: Payer: Self-pay | Admitting: Registered Nurse

## 2020-10-29 VITALS — BP 136/76 | HR 102 | Temp 98.0°F | Resp 18 | Ht 66.0 in | Wt 199.8 lb

## 2020-10-29 DIAGNOSIS — E039 Hypothyroidism, unspecified: Secondary | ICD-10-CM | POA: Diagnosis not present

## 2020-10-29 DIAGNOSIS — M2550 Pain in unspecified joint: Secondary | ICD-10-CM

## 2020-10-29 DIAGNOSIS — R7303 Prediabetes: Secondary | ICD-10-CM

## 2020-10-29 DIAGNOSIS — I10 Essential (primary) hypertension: Secondary | ICD-10-CM

## 2020-10-29 DIAGNOSIS — M791 Myalgia, unspecified site: Secondary | ICD-10-CM

## 2020-10-29 DIAGNOSIS — Z862 Personal history of diseases of the blood and blood-forming organs and certain disorders involving the immune mechanism: Secondary | ICD-10-CM

## 2020-10-29 DIAGNOSIS — N1832 Chronic kidney disease, stage 3b: Secondary | ICD-10-CM

## 2020-10-29 NOTE — Progress Notes (Signed)
Established Patient Office Visit  Subjective:  Patient ID: Erika Cervantes, female    DOB: 11/17/1955  Age: 65 y.o. MRN: [id number]  CC:  Chief Complaint  Patient presents with  . Follow-up    Patient states she is here for a 6 months follow up on medical conditions.     HPI Erika Cervantes presents for 6 mo follow up on htn and hypothyroidism  Hypertension: Patient Currently taking: lisinopril-hctz 20-12.5mg  PO qd.  Good effect. No AEs. Denies CV symptoms including: chest pain, shob, doe, headache, visual changes, fatigue, claudication, and dependent edema.   Previous readings and labs: BP Readings from Last 3 Encounters:  10/29/20 136/76  04/24/20 (!) 146/80  08/01/19 (!) 152/73   Lab Results  Component Value Date   CREATININE 1.09 (H) 04/24/2020   Thyroid Taking levothyroxine PO qd. Good compliance. No AEs No symptoms typical of thyroid disease at this time Hopes to continue.  Aches: Does note some diffuse aches in joints and muscles. Unclear how long this has gone on for. Unsure if this is related to crestor. Would like to take steps to address. Notes fam hx of RA. Notes that she has used diclofenac in the past with good effect. No other changes. No longer uses diclofenac - discussed that this is not advisable to use in setting of CKD.  Otherwise feeling well.   Past Medical History:  Diagnosis Date  . Allergy   . GERD (gastroesophageal reflux disease)   . Hypertension   . PONV (postoperative nausea and vomiting)   . Thyroid disease     Past Surgical History:  Procedure Laterality Date  . ABDOMINAL HYSTERECTOMY  late 1980s   Fibroids were indication.  pt says also had SPO bil.    . APPENDECTOMY  late 1980s   done at time of hysto.    [facility] BREAST SURGERY     Right breast mass  . ESOPHAGOGASTRODUODENOSCOPY (EGD) WITH PROPOFOL N/A 11/08/2018   Procedure: ESOPHAGOGASTRODUODENOSCOPY (EGD) WITH PROPOFOL;  Surgeon: Rachael Fee, MD;  Location[facility] ENDOSCOPY;   Service: Endoscopy;  Laterality: N/A;  . Hammer toes    . TONSILLECTOMY      Family History  Problem Relation Age of Onset  . Heart disease Mother     Social History   Socioeconomic History  . Marital status: Married    Spouse name: Not on file  . Number of children: Not on file  . Years of education: Not on file  . Highest education level: Not on file  Occupational History  . Not on file  Tobacco Use  . Smoking status: Current Every Day Smoker    Last attempt to quit: 08/22/1997    Years since quitting: 23.2  . Smokeless tobacco: Never Used  Vaping Use  . Vaping Use: Never used  Substance and Sexual Activity  . Alcohol use: No  . Drug use: No  . Sexual activity: Not on file  Other Topics Concern  . Not on file  Social History Narrative  . Not on file   Social Determinants of Health   Financial Resource Strain: Not on file  Food Insecurity: Not on file  Transportation Needs: Not on file  Physical Activity: Not on file  Stress: Not on file  Social Connections: Not on file  Intimate Partner Violence: Not on file    Outpatient Medications Prior to Visit  Medication Sig Dispense Refill  . levothyroxine (SYNTHROID) 100 MCG tablet Take 1 tablet (100 mcg total) by  mouth daily with breakfast. 90 tablet 1  . lisinopril-hydrochlorothiazide (ZESTORETIC) 20-12.5 MG tablet Take 1 tablet by mouth daily. 90 tablet 1  . omeprazole (PRILOSEC) 20 MG capsule Take 1 capsule (20 mg total) by mouth daily. 90 capsule 1  . rosuvastatin (CRESTOR) 20 MG tablet Take 1 tablet (20 mg total) by mouth daily. 90 tablet 1  . acetaminophen (TYLENOL) 650 MG CR tablet Take 650 mg by mouth every 8 (eight) hours as needed for pain. (Patient not taking: Reported on 10/29/2020)    . albuterol (PROVENTIL HFA;VENTOLIN HFA) 108 (90 Base) MCG/ACT inhaler Inhale 2 puffs into the lungs every 6 (six) hours as needed. (Patient not taking: No sig reported) 1 Inhaler 1  . ondansetron (ZOFRAN ODT) 4 MG  disintegrating tablet Take 1 tablet (4 mg total) by mouth every 8 (eight) hours as needed for nausea or vomiting. (Patient not taking: Reported on 10/29/2020) 10 tablet 0   No facility-administered medications prior to visit.    Allergies  Allergen Reactions  . Darvocet [Propoxyphene N-Acetaminophen] Nausea And Vomiting    ROS Review of Systems  Constitutional: Negative.   HENT: Negative.   Eyes: Negative.   Respiratory: Negative.   Cardiovascular: Negative.   Gastrointestinal: Negative.   Genitourinary: Negative.   Musculoskeletal: Negative.   Skin: Negative.   Neurological: Negative.   Psychiatric/Behavioral: Negative.   All other systems reviewed and are negative.     Objective:    Physical Exam Vitals and nursing note reviewed.  Constitutional:      General: She is not in acute distress.    Appearance: Normal appearance. She is normal weight. She is not ill-appearing, toxic-appearing or diaphoretic.  Cardiovascular:     Rate and Rhythm: Normal rate and regular rhythm.     Heart sounds: Normal heart sounds. No murmur heard. No friction rub. No gallop.   Pulmonary:     Effort: Pulmonary effort is normal. No respiratory distress.     Breath sounds: Normal breath sounds. No stridor. No wheezing, rhonchi or rales.  Chest:     Chest wall: No tenderness.  Skin:    General: Skin is warm and dry.  Neurological:     General: No focal deficit present.     Mental Status: She is alert and oriented to person, place, and time. Mental status is at baseline.  Psychiatric:        Mood and Affect: Mood normal.        Behavior: Behavior normal.        Thought Content: Thought content normal.        Judgment: Judgment normal.     BP (!) 164/83   Pulse (!) 102   Temp 98 F (36.7 C) (Temporal)   Resp 18   Ht 5\' 6"  (1.676 m)   Wt 199 lb 12.8 oz (90.6 kg)   SpO2 97%   BMI 32.25 kg/m  Wt Readings from Last 3 Encounters:  10/29/20 199 lb 12.8 oz (90.6 kg)  04/24/20 206 lb  3.2 oz (93.5 kg)  08/01/19 200 lb (90.7 kg)     There are no preventive care reminders to display for this patient.  There are no preventive care reminders to display for this patient.  Lab Results  Component Value Date   TSH 3.910 04/24/2020   Lab Results  Component Value Date   WBC 7.1 08/01/2019   HGB 12.2 08/01/2019   HCT 37.3 08/01/2019   MCV 82 08/01/2019   PLT 309 08/01/2019  Lab Results  Component Value Date   NA 141 04/24/2020   K 3.3 (L) 04/24/2020   CO2 23 04/24/2020   GLUCOSE 85 04/24/2020   BUN 17 04/24/2020   CREATININE 1.09 (H) 04/24/2020   BILITOT 0.5 04/24/2020   ALKPHOS 49 04/24/2020   AST 10 04/24/2020   ALT 9 04/24/2020   PROT 6.8 04/24/2020   ALBUMIN 4.3 04/24/2020   CALCIUM 9.5 04/24/2020   ANIONGAP 6 11/07/2018   Lab Results  Component Value Date   CHOL 137 04/24/2020   Lab Results  Component Value Date   HDL 46 04/24/2020   Lab Results  Component Value Date   LDLCALC 61 04/24/2020   Lab Results  Component Value Date   TRIG 178 (H) 04/24/2020   Lab Results  Component Value Date   CHOLHDL 3.0 04/24/2020   Lab Results  Component Value Date   HGBA1C 6.3 (H) 04/24/2020      Assessment & Plan:   Problem List Items Addressed This Visit      Endocrine   Hypothyroidism   Relevant Orders   TSH    Other Visit Diagnoses    Prediabetes    -  Primary   Relevant Orders   Lipid panel   Hemoglobin A1c   Essential hypertension       Relevant Orders   Lipid panel   Comprehensive metabolic panel   CBC   Stage 3b chronic kidney disease (HCC)       Relevant Orders   Lipid panel   Comprehensive metabolic panel   History of anemia       Relevant Orders   CBC   Arthralgia, unspecified joint       Relevant Orders   CK   Vitamin D, 25-hydroxy   C-reactive protein   Sedimentation Rate   Myalgia       Relevant Orders   CK   Vitamin D, 25-hydroxy   C-reactive protein   Sedimentation Rate      No orders of the defined  types were placed in this encounter.   Follow-up: No follow-ups on file.   PLAN  bp wnl, continue regimen  Labs collected. Will follow up with the patient as warranted.  Collected ck, crp, sed rate, vit d to investigate diffuse pain  Return in 3 or 6 mos pending lab results  Patient encouraged to call clinic with any questions, comments, or concerns.  Maximiano Coss, NP

## 2020-10-29 NOTE — Patient Instructions (Signed)
° ° ° °  If you have lab work done today you will be contacted with your lab results within the next 2 weeks.  If you have not heard from us then please contact us. The fastest way to get your results is to register for My Chart. ° ° °IF you received an x-ray today, you will receive an invoice from [facility]. Please contact [facility] at [phone] with questions or concerns regarding your invoice.  ° °IF you received labwork today, you will receive an invoice from [facility]. Please contact [facility] at [phone] with questions or concerns regarding your invoice.  ° °Our billing staff will not be able to assist you with questions regarding bills from these companies. ° °You will be contacted with the lab results as soon as they are available. The fastest way to get your results is to activate your My Chart account. Instructions are located on the last page of this paperwork. If you have not heard from us regarding the results in 2 weeks, please contact this office. °  ° ° ° °

## 2020-10-30 LAB — CBC
Hematocrit: 37 % (ref 34.0–46.6)
Hemoglobin: 12.4 g/dL (ref 11.1–15.9)
MCH: 26.8 pg (ref 26.6–33.0)
MCHC: 33.5 g/dL (ref 31.5–35.7)
MCV: 80 fL (ref 79–97)
Platelets: 324 10*3/uL (ref 150–450)
RBC: 4.63 x10E6/uL (ref 3.77–5.28)
RDW: 13 % (ref 11.7–15.4)
WBC: 7.5 10*3/uL (ref 3.4–10.8)

## 2020-10-30 LAB — COMPREHENSIVE METABOLIC PANEL
ALT: 15 IU/L (ref 0–32)
AST: 22 IU/L (ref 0–40)
Albumin/Globulin Ratio: 1.4 (ref 1.2–2.2)
Albumin: 4.3 g/dL (ref 3.8–4.8)
Alkaline Phosphatase: 49 IU/L (ref 44–121)
BUN/Creatinine Ratio: 14 (ref 12–28)
BUN: 16 mg/dL (ref 8–27)
Bilirubin Total: 0.4 mg/dL (ref 0.0–1.2)
CO2: 22 mmol/L (ref 20–29)
Calcium: 9.9 mg/dL (ref 8.7–10.3)
Chloride: 104 mmol/L (ref 96–106)
Creatinine, Ser: 1.11 mg/dL — ABNORMAL HIGH (ref 0.57–1.00)
GFR calc Af Amer: 61 mL/min/{1.73_m2} (ref >59)
GFR calc non Af Amer: 53 mL/min/{1.73_m2} — ABNORMAL LOW (ref >59)
Globulin, Total: 3 g/dL (ref 1.5–4.5)
Glucose: 123 mg/dL — ABNORMAL HIGH (ref 65–99)
Potassium: 3.9 mmol/L (ref 3.5–5.2)
Sodium: 141 mmol/L (ref 134–144)
Total Protein: 7.3 g/dL (ref 6.0–8.5)

## 2020-10-30 LAB — TSH: TSH: 3.67 u[IU]/mL (ref 0.450–4.500)

## 2020-10-30 LAB — LIPID PANEL
Chol/HDL Ratio: 3.4 ratio (ref 0.0–4.4)
Cholesterol, Total: 140 mg/dL (ref 100–199)
HDL: 41 mg/dL (ref >39)
LDL Chol Calc (NIH): 70 mg/dL (ref 0–99)
Triglycerides: 171 mg/dL — ABNORMAL HIGH (ref 0–149)
VLDL Cholesterol Cal: 29 mg/dL (ref 5–40)

## 2020-10-30 LAB — HEMOGLOBIN A1C
Est. average glucose Bld gHb Est-mCnc: 137 mg/dL
Hgb A1c MFr Bld: 6.4 % — ABNORMAL HIGH (ref 4.8–5.6)

## 2020-10-30 LAB — C-REACTIVE PROTEIN: CRP: 3 mg/L (ref 0–10)

## 2020-10-30 LAB — SEDIMENTATION RATE: Sed Rate: 39 mm/hr (ref 0–40)

## 2020-10-30 LAB — CK: Total CK: 213 U/L — ABNORMAL HIGH (ref 32–182)

## 2020-10-30 LAB — VITAMIN D 25 HYDROXY (VIT D DEFICIENCY, FRACTURES): Vit D, 25-Hydroxy: 31.2 ng/mL (ref 30.0–100.0)

## 2021-04-16 ENCOUNTER — Other Ambulatory Visit: Payer: Self-pay | Admitting: Emergency Medicine

## 2021-04-16 DIAGNOSIS — I10 Essential (primary) hypertension: Secondary | ICD-10-CM

## 2021-04-16 DIAGNOSIS — E039 Hypothyroidism, unspecified: Secondary | ICD-10-CM

## 2021-04-16 DIAGNOSIS — K219 Gastro-esophageal reflux disease without esophagitis: Secondary | ICD-10-CM

## 2021-07-08 ENCOUNTER — Ambulatory Visit (INDEPENDENT_AMBULATORY_CARE_PROVIDER_SITE_OTHER): Payer: Managed Care, Other (non HMO) | Admitting: Emergency Medicine

## 2021-07-08 ENCOUNTER — Encounter: Payer: Self-pay | Admitting: Emergency Medicine

## 2021-07-08 ENCOUNTER — Other Ambulatory Visit: Payer: Self-pay

## 2021-07-08 VITALS — BP 134/74 | HR 60 | Temp 98.6°F | Ht 66.0 in | Wt 195.0 lb

## 2021-07-08 DIAGNOSIS — I10 Essential (primary) hypertension: Secondary | ICD-10-CM | POA: Diagnosis not present

## 2021-07-08 DIAGNOSIS — Z23 Encounter for immunization: Secondary | ICD-10-CM

## 2021-07-08 DIAGNOSIS — E785 Hyperlipidemia, unspecified: Secondary | ICD-10-CM | POA: Diagnosis not present

## 2021-07-08 DIAGNOSIS — E039 Hypothyroidism, unspecified: Secondary | ICD-10-CM | POA: Diagnosis not present

## 2021-07-08 DIAGNOSIS — M8949 Other hypertrophic osteoarthropathy, multiple sites: Secondary | ICD-10-CM

## 2021-07-08 DIAGNOSIS — M2550 Pain in unspecified joint: Secondary | ICD-10-CM

## 2021-07-08 DIAGNOSIS — Z1211 Encounter for screening for malignant neoplasm of colon: Secondary | ICD-10-CM | POA: Diagnosis not present

## 2021-07-08 DIAGNOSIS — M159 Polyosteoarthritis, unspecified: Secondary | ICD-10-CM

## 2021-07-08 LAB — COMPREHENSIVE METABOLIC PANEL
ALT: 11 U/L (ref 0–35)
AST: 18 U/L (ref 0–37)
Albumin: 4 g/dL (ref 3.5–5.2)
Alkaline Phosphatase: 45 U/L (ref 39–117)
BUN: 16 mg/dL (ref 6–23)
CO2: 25 mEq/L (ref 19–32)
Calcium: 9.8 mg/dL (ref 8.4–10.5)
Chloride: 105 mEq/L (ref 96–112)
Creatinine, Ser: 1.18 mg/dL (ref 0.40–1.20)
GFR: 48.63 mL/min — ABNORMAL LOW (ref >60.00)
Glucose, Bld: 92 mg/dL (ref 70–99)
Potassium: 4.1 mEq/L (ref 3.5–5.1)
Sodium: 138 mEq/L (ref 135–145)
Total Bilirubin: 0.4 mg/dL (ref 0.2–1.2)
Total Protein: 7.7 g/dL (ref 6.0–8.3)

## 2021-07-08 LAB — LIPID PANEL
Cholesterol: 209 mg/dL — ABNORMAL HIGH (ref 0–200)
HDL: 41.7 mg/dL (ref >39.00)
LDL Cholesterol: 139 mg/dL — ABNORMAL HIGH (ref 0–99)
NonHDL: 166.93
Total CHOL/HDL Ratio: 5
Triglycerides: 141 mg/dL (ref 0.0–149.0)
VLDL: 28.2 mg/dL (ref 0.0–40.0)

## 2021-07-08 LAB — CBC WITH DIFFERENTIAL/PLATELET
Basophils Absolute: 0.1 10*3/uL (ref 0.0–0.1)
Basophils Relative: 1.1 % (ref 0.0–3.0)
Eosinophils Absolute: 0.3 10*3/uL (ref 0.0–0.7)
Eosinophils Relative: 4.5 % (ref 0.0–5.0)
HCT: 37 % (ref 36.0–46.0)
Hemoglobin: 12.3 g/dL (ref 12.0–15.0)
Lymphocytes Relative: 25.7 % (ref 12.0–46.0)
Lymphs Abs: 1.8 10*3/uL (ref 0.7–4.0)
MCHC: 33.2 g/dL (ref 30.0–36.0)
MCV: 81.9 fl (ref 78.0–100.0)
Monocytes Absolute: 0.4 10*3/uL (ref 0.1–1.0)
Monocytes Relative: 6.4 % (ref 3.0–12.0)
Neutro Abs: 4.3 10*3/uL (ref 1.4–7.7)
Neutrophils Relative %: 62.3 % (ref 43.0–77.0)
Platelets: 296 10*3/uL (ref 150.0–400.0)
RBC: 4.52 Mil/uL (ref 3.87–5.11)
RDW: 13.9 % (ref 11.5–15.5)
WBC: 6.9 10*3/uL (ref 4.0–10.5)

## 2021-07-08 LAB — TSH: TSH: 4.08 u[IU]/mL (ref 0.35–5.50)

## 2021-07-08 LAB — HEMOGLOBIN A1C: Hgb A1c MFr Bld: 6.2 % (ref 4.6–6.5)

## 2021-07-08 LAB — FERRITIN: Ferritin: 32.5 ng/mL (ref 10.0–291.0)

## 2021-07-08 LAB — SEDIMENTATION RATE: Sed Rate: 55 mm/hr — ABNORMAL HIGH (ref 0–30)

## 2021-07-08 MED ORDER — LISINOPRIL-HYDROCHLOROTHIAZIDE 20-12.5 MG PO TABS: 1.0000 | ORAL_TABLET | Freq: Every day | ORAL | 3 refills | Status: DC

## 2021-07-08 MED ORDER — LEVOTHYROXINE SODIUM 100 MCG PO TABS: 100.0000 ug | ORAL_TABLET | Freq: Every day | ORAL | 3 refills | Status: DC

## 2021-07-08 NOTE — Assessment & Plan Note (Signed)
Clinically euthyroid.  Continue Synthroid 100 mcg daily. 

## 2021-07-08 NOTE — Progress Notes (Signed)
Erika Cervantes 65 y.o.   Chief Complaint  Patient presents with   Hypertension    Pt states that her hands are more stiff than usual, pt states she has joint swelling.   Medication Refill    Refill of levothyroxine, lisinopril-hyrochlo.    HISTORY OF PRESENT ILLNESS: This is a 65 y.o. female with history of hypertension here for follow-up and medication refill.  Presently on Zestoretic 20-12.5 mg daily. Also has history of hypothyroidism on levothyroxine 100 mcg daily. Also has history of osteoarthritis complaining of increased joint swelling and stiffness for the past couple of months. No other complaints or medical concerns today.  Hypertension Pertinent negatives include no chest pain, headaches, palpitations or shortness of breath.  Medication Refill Pertinent negatives include no abdominal pain, chest pain, chills, congestion, coughing, fever, headaches, nausea, rash, sore throat or vomiting.    Prior to Admission medications   Medication Sig Start Date End Date Taking? Authorizing Provider  acetaminophen (TYLENOL) 650 MG CR tablet Take 650 mg by mouth every 8 (eight) hours as needed for pain.   Yes [provider]  albuterol (PROVENTIL HFA;VENTOLIN HFA) 108 (90 Base) MCG/ACT inhaler Inhale 2 puffs into the lungs every 6 (six) hours as needed. 12/07/17  Yes Jaynee Eagles, PA-C  levothyroxine (SYNTHROID) 100 MCG tablet TAKE 1 TABLET BY MOUTH DAILY WITH BREAKFAST 04/16/21  Yes Kelsi Benham, Ines Bloomer, MD  lisinopril-hydrochlorothiazide (ZESTORETIC) 20-12.5 MG tablet TAKE ONE TABLET BY MOUTH DAILY 04/16/21  Yes Shawntee Mainwaring, Ines Bloomer, MD  omeprazole (PRILOSEC) 20 MG capsule TAKE ONE CAPSULE BY MOUTH DAILY 04/16/21  Yes Horald Pollen, MD  ondansetron (ZOFRAN ODT) 4 MG disintegrating tablet Take 1 tablet (4 mg total) by mouth every 8 (eight) hours as needed for nausea or vomiting. Patient not taking: No sig reported 10/25/20   Horald Pollen, MD  rosuvastatin (CRESTOR) 20  MG tablet Take 1 tablet (20 mg total) by mouth daily. Patient not taking: Reported on 07/08/2021 10/24/20   Horald Pollen, MD    Allergies  Allergen Reactions   Darvocet [Propoxyphene N-Acetaminophen] Nausea And Vomiting    Patient Active Problem List   Diagnosis Date Noted   Dyslipidemia 08/01/2019   Degenerative arthritis of thumb 09/04/2015   Vitamin D deficiency 05/26/2012   Hypothyroidism 02/26/2012   Hyperlipidemia with target LDL less than 100 02/26/2012   HTN (hypertension) 02/26/2012   Obesity (BMI 30.0-34.9) 02/26/2012   Bulging lumbar disc 02/26/2012   GERD (gastroesophageal reflux disease) 02/26/2012    Past Medical History:  Diagnosis Date   Allergy    GERD (gastroesophageal reflux disease)    Hypertension    PONV (postoperative nausea and vomiting)    Thyroid disease     Past Surgical History:  Procedure Laterality Date   ABDOMINAL HYSTERECTOMY  late 1980s   Fibroids were indication.  pt says also had SPO bil.     APPENDECTOMY  late 1980s   done at time of hysto.     BREAST SURGERY     Right breast mass   ESOPHAGOGASTRODUODENOSCOPY (EGD) WITH PROPOFOL N/A 11/08/2018   Procedure: ESOPHAGOGASTRODUODENOSCOPY (EGD) WITH PROPOFOL;  Surgeon: Milus Banister, MD;  Location: [facility] ENDOSCOPY;  Service: Endoscopy;  Laterality: N/A;   Hammer toes     TONSILLECTOMY      Social History   Socioeconomic History   Marital status: Married    Spouse name: Not on file   Number of children: Not on file   Years of education: Not on  file   Highest education level: Not on file  Occupational History   Not on file  Tobacco Use   Smoking status: Every Day    Types: Cigarettes    Last attempt to quit: 08/22/1997    Years since quitting: 23.8   Smokeless tobacco: Never  Vaping Use   Vaping Use: Never used  Substance and Sexual Activity   Alcohol use: No   Drug use: No   Sexual activity: Not on file  Other Topics Concern   Not on file  Social History  Narrative   Not on file   Social Determinants of Health   Financial Resource Strain: Not on file  Food Insecurity: Not on file  Transportation Needs: Not on file  Physical Activity: Not on file  Stress: Not on file  Social Connections: Not on file  Intimate Partner Violence: Not on file    Family History  Problem Relation Age of Onset   Heart disease Mother      Review of Systems  Constitutional: Negative.  Negative for chills and fever.  HENT: Negative.  Negative for congestion and sore throat.   Respiratory: Negative.  Negative for cough and shortness of breath.   Cardiovascular: Negative.  Negative for chest pain and palpitations.  Gastrointestinal:  Negative for abdominal pain, diarrhea, nausea and vomiting.  Genitourinary: Negative.  Negative for dysuria and hematuria.  Musculoskeletal:  Positive for joint pain.  Skin: Negative.  Negative for rash.  Neurological:  Negative for dizziness and headaches.  All other systems reviewed and are negative.  Today's Vitals   07/08/21 1007  BP: (!) 142/84  Pulse: 60  Temp: 98.6 F (37 C)  TempSrc: Oral  SpO2: 98%  Weight: 195 lb (88.5 kg)  Height: '5\' 6"'$  (1.676 m)   Body mass index is 31.47 kg/m. Wt Readings from Last 3 Encounters:  07/08/21 195 lb (88.5 kg)  10/29/20 199 lb 12.8 oz (90.6 kg)  04/24/20 206 lb 3.2 oz (93.5 kg)    Physical Exam Vitals reviewed.  Constitutional:      Appearance: Normal appearance.  HENT:     Head: Normocephalic.  Eyes:     Extraocular Movements: Extraocular movements intact.     Conjunctiva/sclera: Conjunctivae normal.     Pupils: Pupils are equal, round, and reactive to light.  Cardiovascular:     Rate and Rhythm: Normal rate and regular rhythm.     Pulses: Normal pulses.     Heart sounds: Normal heart sounds.  Pulmonary:     Effort: Pulmonary effort is normal.  Musculoskeletal:     Cervical back: Normal range of motion and neck supple.     Comments: Hands: Mild swelling and  tenderness to PIP and DIP joints bilaterally  Skin:    General: Skin is warm and dry.     Capillary Refill: Capillary refill takes less than 2 seconds.  Neurological:     General: No focal deficit present.     Mental Status: She is alert and oriented to person, place, and time.  Psychiatric:        Mood and Affect: Mood normal.        Behavior: Behavior normal.     ASSESSMENT & PLAN: HTN (hypertension) Well-controlled hypertension with normal blood pressure readings at home. Continue Zestoretic 20-12.5 mg daily. Dietary approaches to stop hypertension discussed.  Hypothyroidism Clinically euthyroid.  Continue Synthroid 100 mcg daily.  Hyperlipidemia with target LDL less than 100 Diet and nutrition discussed.  Presently not taking  statin due to side effects. Lipid profile done today. The 10-year ASCVD risk score (Arnett DK, et al., 2019) is: 15.2%   Values used to calculate the score:     Age: 71 years     Sex: Female     Is Non-Hispanic African American: Yes     Diabetic: No     Tobacco smoker: Yes     Systolic Blood Pressure: Q000111Q mmHg     Is BP treated: Yes     HDL Cholesterol: 41 mg/dL     Total Cholesterol: 140 mg/dL   Primary osteoarthritis involving multiple joints Currently active and affecting quality of life.  Advised to take NSAIDs sparingly and only as needed.  Will refer to rheumatologist for evaluation.  Zendayah was seen today for hypertension and medication refill.  Diagnoses and all orders for this visit:  Essential hypertension -     lisinopril-hydrochlorothiazide (ZESTORETIC) 20-12.5 MG tablet; Take 1 tablet by mouth daily. -     Comprehensive metabolic panel -     Lipid panel -     Hemoglobin A1c  Need for influenza vaccination -     Flu Vaccine QUAD High Dose(Fluad)  Hypothyroidism, unspecified type -     levothyroxine (SYNTHROID) 100 MCG tablet; Take 1 tablet (100 mcg total) by mouth daily with breakfast. -     TSH  Primary osteoarthritis  involving multiple joints -     Ambulatory referral to Rheumatology  Arthralgia of multiple joints -     ANA,IFA RA Diag Pnl w/rflx Tit/Patn -     Sedimentation rate -     Ferritin -     CBC with Differential/Platelet  Dyslipidemia -     Lipid panel -     Hemoglobin A1c  Hyperlipidemia with target LDL less than 100  Colon cancer screening -     Cologuard    Patient Instructions  Health Maintenance After Age 22 After age 36, you are at a higher risk for certain long-term diseases and infections as well as injuries from falls. Falls are a major cause of broken bones and head injuries in people who are older than age 13. Getting regular preventive care can help to keep you healthy and well. Preventive care includes getting regular testing and making lifestyle changes as recommended by your health care provider. Talk with your health care provider about: Which screenings and tests you should have. A screening is a test that checks for a disease when you have no symptoms. A diet and exercise plan that is right for you. What should I know about screenings and tests to prevent falls? Screening and testing are the best ways to find a health problem early. Early diagnosis and treatment give you the best chance of managing medical conditions that are common after age 66. Certain conditions and lifestyle choices may make you more likely to have a fall. Your health care provider may recommend: Regular vision checks. Poor vision and conditions such as cataracts can make you more likely to have a fall. If you wear glasses, make sure to get your prescription updated if your vision changes. Medicine review. Work with your health care provider to regularly review all of the medicines you are taking, including over-the-counter medicines. Ask your health care provider about any side effects that may make you more likely to have a fall. Tell your health care provider if any medicines that you take make you  feel dizzy or sleepy. Osteoporosis screening. Osteoporosis is a condition  that causes the bones to get weaker. This can make the bones weak and cause them to break more easily. Blood pressure screening. Blood pressure changes and medicines to control blood pressure can make you feel dizzy. Strength and balance checks. Your health care provider may recommend certain tests to check your strength and balance while standing, walking, or changing positions. Foot health exam. Foot pain and numbness, as well as not wearing proper footwear, can make you more likely to have a fall. Depression screening. You may be more likely to have a fall if you have a fear of falling, feel emotionally low, or feel unable to do activities that you used to do. Alcohol use screening. Using too much alcohol can affect your balance and may make you more likely to have a fall. What actions can I take to lower my risk of falls? General instructions Talk with your health care provider about your risks for falling. Tell your health care provider if: You fall. Be sure to tell your health care provider about all falls, even ones that seem minor. You feel dizzy, sleepy, or off-balance. Take over-the-counter and prescription medicines only as told by your health care provider. These include any supplements. Eat a healthy diet and maintain a healthy weight. A healthy diet includes low-fat dairy products, low-fat (lean) meats, and fiber from whole grains, beans, and lots of fruits and vegetables. Home safety Remove any tripping hazards, such as rugs, cords, and clutter. Install safety equipment such as grab bars in bathrooms and safety rails on stairs. Keep rooms and walkways well-lit. Activity  Follow a regular exercise program to stay fit. This will help you maintain your balance. Ask your health care provider what types of exercise are appropriate for you. If you need a cane or walker, use it as recommended by your health care  provider. Wear supportive shoes that have nonskid soles. Lifestyle Do not drink alcohol if your health care provider tells you not to drink. If you drink alcohol, limit how much you have: 0-1 drink a day for women. 0-2 drinks a day for men. Be aware of how much alcohol is in your drink. In the [place] one drink equals one typical bottle of beer (12 oz), one-half glass of wine (5 oz), or one shot of hard liquor (1 oz). Do not use any products that contain nicotine or tobacco, such as cigarettes and e-cigarettes. If you need help quitting, ask your health care provider. Summary Having a healthy lifestyle and getting preventive care can help to protect your health and wellness after age 42. Screening and testing are the best way to find a health problem early and help you avoid having a fall. Early diagnosis and treatment give you the best chance for managing medical conditions that are more common for people who are older than age 63. Falls are a major cause of broken bones and head injuries in people who are older than age 82. Take precautions to prevent a fall at home. Work with your health care provider to learn what changes you can make to improve your health and wellness and to prevent falls. This information is not intended to replace advice given to you by your health care provider. Make sure you discuss any questions you have with your health care provider. Document Revised: 12/20/2020 Document Reviewed: 09/27/2020 Elsevier Patient Education  2022 Valinda, MD [facility]

## 2021-07-08 NOTE — Assessment & Plan Note (Signed)
Well-controlled hypertension with normal blood pressure readings at home. Continue Zestoretic 20-12.5 mg daily. Dietary approaches to stop hypertension discussed.

## 2021-07-08 NOTE — Patient Instructions (Signed)

## 2021-07-08 NOTE — Assessment & Plan Note (Signed)
Currently active and affecting quality of life.  Advised to take NSAIDs sparingly and only as needed.  Will refer to rheumatologist for evaluation.

## 2021-07-08 NOTE — Assessment & Plan Note (Addendum)
Diet and nutrition discussed.  Presently not taking statin due to side effects. Lipid profile done today. The 10-year ASCVD risk score (Arnett DK, et al., 2019) is: 15.2%   Values used to calculate the score:     Age: 65 years     Sex: Female     Is Non-Hispanic African American: Yes     Diabetic: No     Tobacco smoker: Yes     Systolic Blood Pressure: Q000111Q mmHg     Is BP treated: Yes     HDL Cholesterol: 41 mg/dL     Total Cholesterol: 140 mg/dL

## 2021-07-09 ENCOUNTER — Telehealth: Payer: Self-pay | Admitting: *Deleted

## 2021-07-09 NOTE — Chronic Care Management (AMB) (Signed)
  Chronic Care Management   Note  07/09/2021 Name: Erika Cervantes MRN: [id number] DOB: 10-05-1956  Erika Cervantes is a 65 y.o. year old female who is a primary care patient of Mitchel Honour, Ines Bloomer, MD. I reached out to [name] by phone today in response to a referral sent by Ms. [facility], [place], MD     Ms. Lattner was given information about Chronic Care Management services today including:  CCM service includes personalized support from designated clinical staff supervised by her physician, including individualized plan of care and coordination with other care providers 24/7 contact phone numbers for assistance for urgent and routine care needs. Service will only be billed when office clinical staff spend 20 minutes or more in a month to coordinate care. Only one practitioner may furnish and bill the service in a calendar month. The patient may stop CCM services at any time (effective at the end of the month) by phone call to the office staff. The patient will be responsible for cost sharing (co-pay) of up to 20% of the service fee (after annual deductible is met).  Patient agreed to services and verbal consent obtained.   Follow up plan: Telephone appointment with care management team member scheduled for:07/25/21  [place] [facility]  Direct Dial: [phone]

## 2021-07-10 LAB — ANA,IFA RA DIAG PNL W/RFLX TIT/PATN
Anti Nuclear Antibody (ANA): POSITIVE — AB
Cyclic Citrullin Peptide Ab: <16 UNITS
Rheumatoid fact SerPl-aCnc: <14 IU/mL (ref <14)

## 2021-07-10 LAB — ANTI-NUCLEAR AB-TITER (ANA TITER): ANA Titer 1: 1:80 {titer} — ABNORMAL HIGH

## 2021-07-15 ENCOUNTER — Telehealth: Payer: Self-pay

## 2021-07-15 NOTE — Telephone Encounter (Signed)
-----   Message from [facility], MD sent at 07/11/2021  3:05 PM EDT ----- Call patient please.  ANA blood test is positive raising possibility of a connective tissue disease.  Patient was referred to rheumatologist for further evaluation.  Thanks.

## 2021-07-15 NOTE — Telephone Encounter (Signed)
Patient aware of results and recommendations. °

## 2021-07-18 DIAGNOSIS — Z1211 Encounter for screening for malignant neoplasm of colon: Secondary | ICD-10-CM | POA: Diagnosis not present

## 2021-07-23 ENCOUNTER — Other Ambulatory Visit: Payer: Self-pay | Admitting: Emergency Medicine

## 2021-07-23 DIAGNOSIS — R195 Other fecal abnormalities: Secondary | ICD-10-CM

## 2021-07-23 LAB — COLOGUARD: Cologuard: POSITIVE — AB

## 2021-07-25 ENCOUNTER — Ambulatory Visit (INDEPENDENT_AMBULATORY_CARE_PROVIDER_SITE_OTHER): Payer: Managed Care, Other (non HMO) | Admitting: *Deleted

## 2021-07-25 DIAGNOSIS — I10 Essential (primary) hypertension: Secondary | ICD-10-CM

## 2021-07-25 DIAGNOSIS — E785 Hyperlipidemia, unspecified: Secondary | ICD-10-CM | POA: Diagnosis not present

## 2021-07-25 NOTE — Patient Instructions (Signed)
Visit Information   Erika Cervantes, it was nice talking with you today.    I look forward to talking to you again for an update on Friday, January 09, 2021 at 10:100 am- please be listening out for my call that day.  I will call as close to 10:00 am as possible.   If you need to cancel or re-schedule our telephone visit, please call [phone] and one of our care guides will be happy to assist you.   I look forward to hearing about your progress.   Please don't hesitate to contact me if I can be of assistance to you before our next scheduled telephone appointment.   Oneta Rack, RN, BSN, [facility] RN Care Coordination- [place] [phone]: direct office [phone]: mobile   PATIENT GOALS:   Goals Addressed             This Visit's Progress    Patient Self-Care Activities   On track    Timeframe:  Long-Range Goal Priority:  Medium Start Date:       07/25/21                      Expected End Date:   07/25/22                    Patient will self administer medications as prescribed as evidenced by self report/primary caregiver report  Patient will attend all scheduled provider appointments as evidenced by clinician review of documented attendance to scheduled appointments and patient/caregiver report Patient will call pharmacy for medication refills as evidenced by patient report and review of pharmacy fill history as appropriate Patient will call provider office for new concerns or questions as evidenced by review of documented incoming telephone call notes and patient report Patient will continue to follow heart healthy, low salt, low cholesterol diet; patient will consider beginning to limit carbohydrates/ sugar in her diet Patient will remain active and get regular weekly exercise Patient will continue to monitor blood pressures at home weekly       Cholesterol Content in Foods Cholesterol is a waxy, fat-like substance that helps to carry fat  in the blood. The body needs cholesterol in small amounts, but too much cholesterol can cause damage to the arteries and heart. Most people should eat less than 200 milligrams (mg) of cholesterol a day. Foods with cholesterol Cholesterol is found in animal-based foods, such as meat, seafood, and dairy. Generally, low-fat dairy and lean meats have less cholesterol than full-fat dairy and fatty meats. The milligrams of cholesterol per serving (mg per serving) of common cholesterol-containing foods are listed below. Meat and other proteins Egg -- one large whole egg has 186 mg. Veal shank -- 4 oz has 141 mg. Lean ground [place] (93% lean) -- 4 oz has 118 mg. Fat-trimmed lamb loin -- 4 oz has 106 mg. Lean ground beef (90% lean) -- 4 oz has 100 mg. Lobster -- 3.5 oz has 90 mg. Pork loin chops -- 4 oz has 86 mg. Canned salmon -- 3.5 oz has 83 mg. Fat-trimmed beef top loin -- 4 oz has 78 mg. Frankfurter -- 1 frank (3.5 oz) has 77 mg. Crab -- 3.5 oz has 71 mg. Roasted chicken without skin, white meat -- 4 oz has 66 mg. Light bologna -- 2 oz has 45 mg. Deli-cut [place] -- 2 oz has 31 mg. Canned tuna -- 3.5 oz has 31 mg. Berniece Salines -- 1  oz has 29 mg. Oysters and mussels (raw) -- 3.5 oz has 25 mg. Mackerel -- 1 oz has 22 mg. Trout -- 1 oz has 20 mg. Pork sausage -- 1 link (1 oz) has 17 mg. Salmon -- 1 oz has 16 mg. Tilapia -- 1 oz has 14 mg. Dairy Soft-serve ice cream --  cup (4 oz) has 103 mg. Whole-milk yogurt -- 1 cup (8 oz) has 29 mg. Cheddar cheese -- 1 oz has 28 mg. American cheese -- 1 oz has 28 mg. Whole milk -- 1 cup (8 oz) has 23 mg. 2% milk -- 1 cup (8 oz) has 18 mg. Cream cheese -- 1 tablespoon (Tbsp) has 15 mg. Cottage cheese --  cup (4 oz) has 14 mg. Low-fat (1%) milk -- 1 cup (8 oz) has 10 mg. Sour cream -- 1 Tbsp has 8.5 mg. Low-fat yogurt -- 1 cup (8 oz) has 8 mg. Nonfat Greek yogurt -- 1 cup (8 oz) has 7 mg. Half-and-half cream -- 1 Tbsp has 5 mg. Fats and oils Cod liver  oil -- 1 tablespoon (Tbsp) has 82 mg. Butter -- 1 Tbsp has 15 mg. Lard -- 1 Tbsp has 14 mg. Bacon grease -- 1 Tbsp has 14 mg. Mayonnaise -- 1 Tbsp has 5-10 mg. Margarine -- 1 Tbsp has 3-10 mg. Exact amounts of cholesterol in these foods may vary depending on specific ingredients and brands. Foods without cholesterol Most plant-based foods do not have cholesterol unless you combine them with a food that has cholesterol. Foods without cholesterol include: Grains and cereals. Vegetables. Fruits. Vegetable oils, such as olive, canola, and sunflower oil. Legumes, such as peas, beans, and lentils. Nuts and seeds. Egg whites. Summary The body needs cholesterol in small amounts, but too much cholesterol can cause damage to the arteries and heart. Most people should eat less than 200 milligrams (mg) of cholesterol a day. This information is not intended to replace advice given to you by your health care provider. Make sure you discuss any questions you have with your health care provider. Document Revised: 01/23/2020 Document Reviewed: 03/04/2020 Elsevier Patient Education  [place].  Colonoscopy, Adult A colonoscopy is a procedure to look at the entire large intestine. This procedure is done using a long, thin, flexible tube that has a camera on the end. You may have a colonoscopy: As a part of normal colorectal screening. If you have certain symptoms, such as: A low number of red blood cells in your blood (anemia). Diarrhea that does not go away. Pain in your abdomen. Blood in your stool. A colonoscopy can help screen for and diagnose medical problems, including: Tumors. Extra tissue that grows where mucus forms (polyps). Inflammation. Areas of bleeding. Tell your health care provider about: Any allergies you have. All medicines you are taking, including vitamins, herbs, eye drops, creams, and over-the-counter medicines. Any problems you or family members have had with  anesthetic medicines. Any blood disorders you have. Any surgeries you have had. Any medical conditions you have. Any problems you have had with having bowel movements. Whether you are pregnant or may be pregnant. What are the risks? Generally, this is a safe procedure. However, problems may occur, including: Bleeding. Damage to your intestine. Allergic reactions to medicines given during the procedure. Infection. This is rare. What happens before the procedure? Eating and drinking restrictions Follow instructions from your health care provider about eating or drinking restrictions, which may include: A few days before the procedure: Follow a low-fiber  diet. Avoid nuts, seeds, dried fruit, raw fruits, and vegetables. 1-3 days before the procedure: Eat only gelatin dessert or ice pops. Drink only clear liquids, such as water, clear juice, clear broth or bouillon, black coffee or tea, or clear soft drinks or sports drinks. Avoid liquids that contain red or purple dye. The day of the procedure: Do not eat solid foods. You may continue to drink clear liquids until up to 2 hours before the procedure. Do not eat or drink anything starting 2 hours before the procedure, or within the time period that your health care provider recommends. Bowel prep If you were prescribed a bowel prep to take by mouth (orally) to clean out your colon: Take it as told by your health care provider. Starting the day before your procedure, you will need to drink a large amount of liquid medicine. The liquid will cause you to have many bowel movements of loose stool until your stool becomes almost clear or light green. If your skin or the opening between the buttocks (anus) gets irritated from diarrhea, you may relieve the irritation using: Wipes with medicine in them, such as adult wet wipes with aloe and vitamin E. A product to soothe skin, such as petroleum jelly. If you vomit while drinking the bowel prep: Take  a break for up to 60 minutes. Begin the bowel prep again. Call your health care provider if you keep vomiting or you cannot take the bowel prep without vomiting. To clean out your colon, you may also be given: Laxative medicines. These help you have a bowel movement. Instructions for enema use. An enema is liquid medicine injected into your rectum. Medicines Ask your health care provider about: Changing or stopping your regular medicines or supplements. This is especially important if you are taking iron supplements, diabetes medicines, or blood thinners. Taking medicines such as aspirin and ibuprofen. These medicines can thin your blood. Do not take these medicines unless your health care provider tells you to take them. Taking over-the-counter medicines, vitamins, herbs, and supplements. General instructions Ask your health care provider what steps will be taken to help prevent infection. These may include washing skin with a germ-killing soap. Plan to have someone take you home from the hospital or clinic. What happens during the procedure?  An IV will be inserted into one of your veins. You may be given one or more of the following: A medicine to help you relax (sedative). A medicine to numb the area (local anesthetic). A medicine to make you fall asleep (general anesthetic). This is rarely needed. You will lie on your side with your knees bent. The tube will: Have oil or gel put on it (be lubricated). Be inserted into your anus. Be gently eased through all parts of your large intestine. Air will be sent into your colon to keep it open. This may cause some pressure or cramping. Images will be taken with the camera and will appear on a screen. A small tissue sample may be removed to be looked at under a microscope (biopsy). The tissue may be sent to a lab for testing if any signs of problems are found. If small polyps are found, they may be removed and checked for cancer cells. When  the procedure is finished, the tube will be removed. The procedure may vary among health care providers and hospitals. What happens after the procedure? Your blood pressure, heart rate, breathing rate, and blood oxygen level will be monitored until you leave the hospital  or clinic. You may have a small amount of blood in your stool. You may pass gas and have mild cramping or bloating in your abdomen. This is caused by the air that was used to open your colon during the exam. Do not drive for 24 hours after the procedure. It is up to you to get the results of your procedure. Ask your health care provider, or the department that is doing the procedure, when your results will be ready. Summary A colonoscopy is a procedure to look at the entire large intestine. Follow instructions from your health care provider about eating and drinking before the procedure. If you were prescribed an oral bowel prep to clean out your colon, take it as told by your health care provider. During the colonoscopy, a flexible tube with a camera on its end is inserted into the anus and then passed into the other parts of the large intestine. This information is not intended to replace advice given to you by your health care provider. Make sure you discuss any questions you have with your health care provider. Document Revised: 05/05/2019 Document Reviewed: 05/05/2019 Elsevier Patient Education  2022 [employer].  Consent to CCM Services: Erika Cervantes was given information about Chronic Care Management services including:  CCM service includes personalized support from designated clinical staff supervised by her physician, including individualized plan of care and coordination with other care providers 24/7 contact phone numbers for assistance for urgent and routine care needs. Service will only be billed when office clinical staff spend 20 minutes or more in a month to coordinate care. Only one practitioner may furnish and  bill the service in a calendar month. The patient may stop CCM services at any time (effective at the end of the month) by phone call to the office staff. The patient will be responsible for cost sharing (co-pay) of up to 20% of the service fee (after annual deductible is met).  Patient agreed to services and verbal consent obtained.   The patient verbalized understanding of instructions, educational materials, and care plan provided today and agreed to receive a mailed copy of patient instructions, educational materials, and care plan Telephone follow up appointment with care management team member scheduled for:  Friday, January 09, 2021 at 10:100 am The patient has been provided with contact information for the care management team and has been advised to call with any health related questions or concerns  Oneta Rack, RN, BSN, [place] [phone]: direct office [phone]: mobile   CLINICAL CARE PLAN: Patient Care Plan: RN Care Manager Plan of Care     Problem Identified: Chronic Disease Management Needs   Priority: Medium     Long-Range Goal: Development of plan of care for long term chronic disease management   Start Date: 07/25/2021  Expected End Date: 07/25/2022  Priority: Medium  Note:   Current Barriers:  Chronic Disease Management support and education needs related to HTN and HLD  RNCM Clinical Goal(s):  Patient will demonstrate ongoing health management independence HTN; HLD  through collaboration with RN Care manager, provider, and care team.   Interventions: 1:1 collaboration with primary care provider regarding development and update of comprehensive plan of care as evidenced by provider attestation and co-signature Inter-disciplinary care team collaboration (see longitudinal plan of care) Evaluation of current treatment plan related to  self management and patient's adherence to plan as  established by provider  Hyperlipidemia:  (Status:  New goal.) Lab Results  Component Value Date   CHOL 209 (H) 07/08/2021   HDL 41.70 07/08/2021   LDLCALC 139 (H) 07/08/2021   LDLDIRECT 181 (H) 03/12/2014   TRIG 141.0 07/08/2021   CHOLHDL 5 07/08/2021    Medication review performed; medication list updated in electronic medical record.  Provider established cholesterol goals reviewed; Counseled on importance of regular laboratory monitoring as prescribed; Provided HLD educational materials; Reviewed role and benefits of statin for ASCVD risk reduction; Reviewed importance of limiting foods high in cholesterol; Reviewed exercise goals and target of 150 minutes per week; Screening for signs and symptoms of depression related to chronic disease state;  Assessed social determinant of health barriers;   Hypertension: (Status: New goal.) Last practice recorded BP readings:  BP Readings from Last 3 Encounters:  07/08/21 134/74  10/29/20 136/76  04/24/20 (!) 146/80  Most recent eGFR/CrCl: No results found for: EGFR  No components found for: CRCL  Evaluation of current treatment plan related to hypertension self management and patient's adherence to plan as established by provider;   Reviewed prescribed diet low salt, heart healthy, low cholesterol Reviewed medications with patient and discussed importance of compliance;  Counseled on the importance of exercise goals with target of 150 minutes per week Discussed plans with patient for ongoing care management follow up and provided patient with direct contact information for care management team; Discussed complications of poorly controlled blood pressure such as heart disease, stroke, circulatory complications, vision complications, kidney impairment, sexual dysfunction;  Medication review completed and medication list in EHR updated according to patient report: patient reports good general understanding of the purpose, dosing, and  scheduling of prescribed medications: no concerns or discrepancies identified as a result of medication review today Reviewed with patient recent PCP office visit 07/08/21: patient verbalizes good understanding of post- office visit instructions Discussed value of limiting carbohydrates/ sugar in diet for prevention of DM Confirmed patient has scheduled office visit with rheumatology provider for OA- bilateral hands, with plans to attend as scheduled- 08/19/21 Confirmed patient understands to listen for call to schedule colonoscopy- has not yet heard from scheduler Confirmed patient is active on a regular basis- walks, works out at [employer], rides stationary bike at home: this was encouraged/ positive reinforcement provided Confirmed patient monitors blood pressures at home weekly: we discussed general target blood pressure- she reports all blood pressures at home "in target," if she is at rest- acknowledges that BP will rise with activity- I discussed that this is normal, as long as it decreases to target/ goal after completion of activity- she confirms it does; does not have recorded blood pressures form home to review in detail Confirmed patient obtained flu vaccine for 2022-23 flu season at time of PCP office visit 07/08/21- positive reinforcement provided  Patient Goals/Self-Care Activities: Patient will self administer medications as prescribed as evidenced by self report/primary caregiver report  Patient will attend all scheduled provider appointments as evidenced by clinician review of documented attendance to scheduled appointments and patient/caregiver report Patient will call pharmacy for medication refills as evidenced by patient report and review of pharmacy fill history as appropriate Patient will call provider office for new concerns or questions as evidenced by review of documented incoming telephone call notes and patient report Patient will continue to follow heart healthy, low salt, low  cholesterol diet; patient will consider beginning to limit carbohydrates/ sugar in her diet Patient will remain active and get regular weekly exercise Patient will continue to monitor blood pressures  at home weekly

## 2021-07-25 NOTE — Chronic Care Management (AMB) (Signed)
Chronic Care Management   CCM RN Visit Note  07/25/2021 Name: Erika Cervantes MRN: [id number] DOB: May 03, 1956  Subjective: Cindie Rajagopalan is a 65 y.o. year old female who is a primary care patient of Mitchel Honour, Ines Bloomer, MD. The care management team was consulted for assistance with disease management and care coordination needs.    Engaged with patient by telephone for initial visit in response to provider referral for case management and/or care coordination services.   Consent to Services:  The patient was given information about Chronic Care Management services, agreed to services, and gave verbal consent 07/09/21 prior to initiation of services.  Please see initial visit note for detailed documentation.  Patient agreed to services and verbal consent obtained.   Assessment: Review of patient past medical history, allergies, medications, health status, including review of consultants reports, laboratory and other test data, was performed as part of comprehensive evaluation and provision of chronic care management services.   SDOH (Social Determinants of Health) assessments and interventions performed:  SDOH Interventions    Flowsheet Row Most Recent Value  SDOH Interventions   Food Insecurity Interventions Intervention Not Indicated  Housing Interventions Intervention Not Indicated  Transportation Interventions Intervention Not Indicated  [Patient drives self]       CCM Care Plan Allergies  Allergen Reactions   Darvocet [Propoxyphene N-Acetaminophen] Nausea And Vomiting   Outpatient Encounter Medications as of 07/25/2021  Medication Sig Note   acetaminophen (TYLENOL) 650 MG CR tablet Take 650 mg by mouth every 8 (eight) hours as needed for pain.    albuterol (PROVENTIL HFA;VENTOLIN HFA) 108 (90 Base) MCG/ACT inhaler Inhale 2 puffs into the lungs every 6 (six) hours as needed. 07/25/2021: 07/25/21-- reports has not needed recently   levothyroxine (SYNTHROID) 100 MCG tablet Take 1 tablet  (100 mcg total) by mouth daily with breakfast.    lisinopril-hydrochlorothiazide (ZESTORETIC) 20-12.5 MG tablet Take 1 tablet by mouth daily.    omeprazole (PRILOSEC) 20 MG capsule TAKE ONE CAPSULE BY MOUTH DAILY    ondansetron (ZOFRAN ODT) 4 MG disintegrating tablet Take 1 tablet (4 mg total) by mouth every 8 (eight) hours as needed for nausea or vomiting. (Patient not taking: No sig reported)    rosuvastatin (CRESTOR) 20 MG tablet Take 1 tablet (20 mg total) by mouth daily. (Patient not taking: No sig reported)    No facility-administered encounter medications on file as of 07/25/2021.   Patient Active Problem List   Diagnosis Date Noted   Primary osteoarthritis involving multiple joints 07/08/2021   Dyslipidemia 08/01/2019   Degenerative arthritis of thumb 09/04/2015   Vitamin D deficiency 05/26/2012   Hypothyroidism 02/26/2012   Hyperlipidemia with target LDL less than 100 02/26/2012   HTN (hypertension) 02/26/2012   Obesity (BMI 30.0-34.9) 02/26/2012   Bulging lumbar disc 02/26/2012   GERD (gastroesophageal reflux disease) 02/26/2012   Conditions to be addressed/monitored:  HTN and HLD  Care Plan : RN Care Manager Plan of Care  Updates made by Knox Royalty, RN since 07/25/2021 12:00 AM     Problem: Chronic Disease Management Needs   Priority: Medium     Long-Range Goal: Development of plan of care for long term chronic disease management   Start Date: 07/25/2021  Expected End Date: 07/25/2022  Priority: Medium  Note:   Current Barriers:  Chronic Disease Management support and education needs related to HTN and HLD  RNCM Clinical Goal(s):  Patient will demonstrate ongoing health management independence HTN; HLD  through collaboration with RN Care  manager, provider, and care team.   Interventions: 1:1 collaboration with primary care provider regarding development and update of comprehensive plan of care as evidenced by provider attestation and  co-signature Inter-disciplinary care team collaboration (see longitudinal plan of care) Evaluation of current treatment plan related to  self management and patient's adherence to plan as established by provider  Hyperlipidemia:  (Status: New goal.) Lab Results  Component Value Date   CHOL 209 (H) 07/08/2021   HDL 41.70 07/08/2021   LDLCALC 139 (H) 07/08/2021   LDLDIRECT 181 (H) 03/12/2014   TRIG 141.0 07/08/2021   CHOLHDL 5 07/08/2021    Medication review performed; medication list updated in electronic medical record.  Provider established cholesterol goals reviewed; Counseled on importance of regular laboratory monitoring as prescribed; Provided HLD educational materials; Reviewed role and benefits of statin for ASCVD risk reduction; Reviewed importance of limiting foods high in cholesterol; Reviewed exercise goals and target of 150 minutes per week; Screening for signs and symptoms of depression related to chronic disease state;  Assessed social determinant of health barriers;   Hypertension: (Status: New goal.) Last practice recorded BP readings:  BP Readings from Last 3 Encounters:  07/08/21 134/74  10/29/20 136/76  04/24/20 (!) 146/80  Most recent eGFR/CrCl: No results found for: EGFR  No components found for: CRCL  Evaluation of current treatment plan related to hypertension self management and patient's adherence to plan as established by provider;   Reviewed prescribed diet low salt, heart healthy, low cholesterol Reviewed medications with patient and discussed importance of compliance;  Counseled on the importance of exercise goals with target of 150 minutes per week Discussed plans with patient for ongoing care management follow up and provided patient with direct contact information for care management team; Discussed complications of poorly controlled blood pressure such as heart disease, stroke, circulatory complications, vision complications, kidney impairment,  sexual dysfunction;  Medication review completed and medication list in EHR updated according to patient report: patient reports good general understanding of the purpose, dosing, and scheduling of prescribed medications: no concerns or discrepancies identified as a result of medication review today Reviewed with patient recent PCP office visit 07/08/21: patient verbalizes good understanding of post- office visit instructions Discussed value of limiting carbohydrates/ sugar in diet for prevention of DM Confirmed patient has scheduled office visit with rheumatology provider for OA- bilateral hands, with plans to attend as scheduled- 08/19/21 Confirmed patient understands to listen for call to schedule colonoscopy- has not yet heard from scheduler Confirmed patient is active on a regular basis- walks, works out at [employer], rides stationary bike at home: this was encouraged/ positive reinforcement provided Confirmed patient monitors blood pressures at home weekly: we discussed general target blood pressure- she reports all blood pressures at home "in target," if she is at rest- acknowledges that BP will rise with activity- I discussed that this is normal, as long as it decreases to target/ goal after completion of activity- she confirms it does; does not have recorded blood pressures form home to review in detail Confirmed patient obtained flu vaccine for 2022-23 flu season at time of PCP office visit 07/08/21- positive reinforcement provided  Patient Goals/Self-Care Activities: Patient will self administer medications as prescribed as evidenced by self report/primary caregiver report  Patient will attend all scheduled provider appointments as evidenced by clinician review of documented attendance to scheduled appointments and patient/caregiver report Patient will call pharmacy for medication refills as evidenced by patient report and review of pharmacy fill history as appropriate  Patient will call provider  office for new concerns or questions as evidenced by review of documented incoming telephone call notes and patient report Patient will continue to follow heart healthy, low salt, low cholesterol diet; patient will consider beginning to limit carbohydrates/ sugar in her diet Patient will remain active and get regular weekly exercise Patient will continue to monitor blood pressures at home weekly       Plan: Telephone follow up appointment with care management team member scheduled for:  Friday, January 09, 2021 at 10:100 am The patient has been provided with contact information for the care management team and has been advised to call with any health related questions or concerns  Oneta Rack, RN, BSN, [place] [phone]: direct office [phone]: mobile

## 2021-08-18 NOTE — Progress Notes (Signed)
Office Visit Note  Patient: Erika Cervantes             Date of Birth: Feb 09, 1956           MRN: [id number]             PCP: Horald Pollen, MD Referring: Horald Pollen, * Visit Date: 08/19/2021  Subjective:  New Patient (Initial Visit) (Total body pain,bil hand swelling and stiffness, abnormal labs)   History of Present Illness: Erika Cervantes is a 65 y.o. female here for arthritis of bilateral hands with increase pain and swelling particularly at PIP and DIP joints.  She has had chronic osteoarthritis symptoms in multiple sites particularly in the lumbar spine and the left thumb in the past also some knee stiffness and crepitus.  But for the last about 5 months has increased hand pain morning stiffness and intermittent swelling.  Symptoms have forced her to stop wearing her rings most of the time due to swelling.  Worst affected is her left ring finger with swelling and hyperpigmentation at the PIP joint.  She weeks ago with some morning numbness has previously had carpal tunnel syndrome that improved with conservative management including wrist braces.  Outside of the hand pain she has had some increase in pain with the right shoulder particularly lying on her side in bed and increased clicking or popping of her knee use with sitting standing and when walking.  She has taken NSAIDs for this problem but limited use recommended due to mild renal function impairment and due to GERD also has positive Cologuard study. Lab studies in PCP office last month showing low positive ANA and elevated sedimentation rate, negative rheumatoid factor and CCP.  Labs reviewed 06/2021 ANA 1:80 speckled RF neg CCP neg CBC wnl ESR 55 CMP eGFR 48.63  Activities of Daily Living:  Patient reports morning stiffness for 1 hour.   Patient Reports nocturnal pain.  Difficulty dressing/grooming: Reports Difficulty climbing stairs: Reports Difficulty getting out of chair: Reports Difficulty using hands for  taps, buttons, cutlery, and/or writing: Reports  Review of Systems  Constitutional:  Positive for fatigue.  HENT:  Negative for mouth dryness.   Eyes:  Positive for dryness.  Respiratory:  Positive for shortness of breath.   Cardiovascular:  Negative for swelling in legs/feet.  Gastrointestinal:  Positive for constipation.  Endocrine: Positive for cold intolerance.  Genitourinary:  Negative for difficulty urinating.  Musculoskeletal:  Positive for joint pain, joint pain, joint swelling, muscle weakness and morning stiffness.  Skin:  Positive for rash and nodules/bumps.  Allergic/Immunologic: Negative for susceptible to infections.  Neurological:  Positive for numbness and weakness.  Hematological:  Negative for bruising/bleeding tendency.  Psychiatric/Behavioral:  Negative for sleep disturbance.    PMFS History:  Patient Active Problem List   Diagnosis Date Noted   Primary osteoarthritis involving multiple joints 07/08/2021   Dyslipidemia 08/01/2019   Degenerative arthritis of thumb 09/04/2015   Vitamin D deficiency 05/26/2012   Hypothyroidism 02/26/2012   Hyperlipidemia with target LDL less than 100 02/26/2012   HTN (hypertension) 02/26/2012   Obesity (BMI 30.0-34.9) 02/26/2012   Bulging lumbar disc 02/26/2012   GERD (gastroesophageal reflux disease) 02/26/2012    Past Medical History:  Diagnosis Date   Allergy    GERD (gastroesophageal reflux disease)    Hypertension    PONV (postoperative nausea and vomiting)    Thyroid disease     Family History  Problem Relation Age of Onset   Heart disease Mother  Rheum arthritis Mother    Past Surgical History:  Procedure Laterality Date   ABDOMINAL HYSTERECTOMY  late 1980s   Fibroids were indication.  pt says also had SPO bil.     APPENDECTOMY  late 1980s   done at time of hysto.     BACK SURGERY     BREAST SURGERY     Right breast mass   ESOPHAGOGASTRODUODENOSCOPY (EGD) WITH PROPOFOL N/A 11/08/2018   Procedure:  ESOPHAGOGASTRODUODENOSCOPY (EGD) WITH PROPOFOL;  Surgeon: Milus Banister, MD;  Location[facility] ENDOSCOPY;  Service: Endoscopy;  Laterality: N/A;   Hammer toes     TONSILLECTOMY     Social History   Social History Narrative   Not on file   Immunization History  Administered Date(s) Administered   Fluad Quad(high Dose 65+) 07/08/2021   Influenza Split 08/28/2011   Influenza,inj,Quad PF,6+ Mos 10/06/2013, 07/10/2015, 07/15/2016, 10/24/2018, 08/01/2019   PFIZER(Purple Top)SARS-COV-2 Vaccination 01/09/2020, 01/29/2020, 09/03/2020   Pneumococcal Polysaccharide-23 08/01/2019   Td 10/26/2002   Tdap 05/26/2012     Objective: Vital Signs: BP (!) 158/73 (BP Location: Right Arm, Patient Position: Sitting, Cuff Size: Normal)   Pulse 80   Resp 16   Ht _0  (1.651 m)   Wt 199 lb (90.3 kg)   BMI 33.12 kg/m    Physical Exam Constitutional:      Appearance: She is obese.  HENT:     Right Ear: External ear normal.     Left Ear: External ear normal.     Mouth/Throat:     Mouth: Mucous membranes are moist.     Pharynx: Oropharynx is clear.  Eyes:     Conjunctiva/sclera: Conjunctivae normal.  Cardiovascular:     Rate and Rhythm: Normal rate and regular rhythm.  Pulmonary:     Effort: Pulmonary effort is normal.     Breath sounds: Normal breath sounds.  Musculoskeletal:     Right lower leg: No edema.     Left lower leg: No edema.  Skin:    General: Skin is warm and dry.     Findings: No rash.  Neurological:     Mental Status: She is alert.     Deep Tendon Reflexes: Reflexes normal.  Psychiatric:        Mood and Affect: Mood normal.     Musculoskeletal Exam:  Neck full ROM no tenderness Shoulders full ROM, right shoulder pain with full overhead abduction Elbows slightly hyperextensible, no tenderness or swelling Wrists full ROM no tenderness or swelling Fingers full ROM no tenderness, palpable joint swelling in left 4th PIP with overlying hyperpigmentation, limited ultrasound  inspection showing synovial hypertrophy without effusion or color doppler enhancement, heberdon's nodes of other finger joints Knees full ROM no tenderness or swelling, patellofemoral crepitus present Ankles full ROM no tenderness or swelling   Investigation: No additional findings.  Imaging: XR Hand 2 View Left  Result Date: 08/19/2021 X-ray of left hand 2 views Degenerative arthritis of the first [facility] joint more advanced as compared to right thumb imaging several subchondral cystic changes.  Previously described synovial chondromatosis changes are not well demonstrated with in the current imaging.  MCP joint spaces appear preserved early osteophyte changes at the second MCP on ulnar margin.  Some periarticular calcification seen at second third PIPs no obvious abnormality around the enlarged fourth PIP.  PIP joints mild degenerative change worse at the fifth DIP in mild flexion deformity.  Bone mineralization appears normal. Impression Moderately advanced first CMC joint osteoarthritis with partial joint  subluxation milder osteoarthritis changes in distal finger joints, no erosions or focal bone demineralization, no osseus change underlying observed 4th PIP joint changes  XR Hand 2 View Right  Result Date: 08/19/2021 X-ray right hand 2 views Radiocarpal joint space appears normal.  Moderate degenerative arthritis changes in the first [facility] joint.  MCP joint spaces are preserved early ulnar side osteophyte formation at second MCP.  Mild asymmetric joint space narrowing and early lateral osteophytes at PIP and DIP joints.  Fifth DIP joint is fused.  Bone mineralization appears normal. Impression Moderate osteoarthritis in the thumb milder disease in the distal finger joints, no erosive disease changes   Recent Labs: Lab Results  Component Value Date   WBC 6.9 07/08/2021   HGB 12.3 07/08/2021   PLT 296.0 07/08/2021   NA 138 07/08/2021   K 4.1 07/08/2021   CL 105 07/08/2021   CO2 25 07/08/2021    GLUCOSE 92 07/08/2021   BUN 16 07/08/2021   CREATININE 1.18 07/08/2021   BILITOT 0.4 07/08/2021   ALKPHOS 45 07/08/2021   AST 18 07/08/2021   ALT 11 07/08/2021   PROT 7.7 07/08/2021   ALBUMIN 4.0 07/08/2021   CALCIUM 9.8 07/08/2021   GFRAA 61 10/29/2020    Speciality Comments: No specialty comments available.  Procedures:  No procedures performed Allergies: Darvocet [propoxyphene n-acetaminophen]   Assessment / Plan:     Visit Diagnoses: Primary osteoarthritis involving multiple joints - Plan: XR Hand 2 View Right, XR Hand 2 View Left  Symptoms appear most consistent with primary osteoarthritis of bilateral hands.  Negative autoimmune serology not clear why she had a mildly elevated sedimentation rate on prior labs.  There is chronic synovial hypertrophy of the left fourth PIP with no active inflammation changes appreciable.  Bilateral hand x-rays obtained show degenerative arthritis change at multiple sites most advanced in the base of the thumb and worse on the left. Discussed hand osteoarthritis treatment options including oral NSAIDs, oral supplements, topical analgesics or NSAIDs, compressive wrist gloves, or if symptoms fail to improve with these conservative measures could refer to occupational therapy or trial of local steroid joint injection. Prior NSAID use limited by gastritis but also mild renal function impairment.  Orders: Orders Placed This Encounter  Procedures   XR Hand 2 View Right   XR Hand 2 View Left    No orders of the defined types were placed in this encounter.    Follow-Up Instructions: Return if symptoms worsen or fail to improve.   Collier Salina, MD  Note - This record has been created using [employer].  Chart creation errors have been sought, but may not always  have been located. Such creation errors do not reflect on  the standard of medical care.

## 2021-08-19 ENCOUNTER — Ambulatory Visit: Payer: Medicare HMO | Admitting: Internal Medicine

## 2021-08-19 ENCOUNTER — Other Ambulatory Visit: Payer: Self-pay

## 2021-08-19 ENCOUNTER — Encounter: Payer: Self-pay | Admitting: Internal Medicine

## 2021-08-19 ENCOUNTER — Ambulatory Visit: Payer: Self-pay

## 2021-08-19 ENCOUNTER — Encounter (INDEPENDENT_AMBULATORY_CARE_PROVIDER_SITE_OTHER): Payer: Self-pay

## 2021-08-19 VITALS — BP 158/73 | HR 80 | Resp 16 | Ht 65.0 in | Wt 199.0 lb

## 2021-08-19 DIAGNOSIS — M79642 Pain in left hand: Secondary | ICD-10-CM | POA: Diagnosis not present

## 2021-08-19 DIAGNOSIS — M1811 Unilateral primary osteoarthritis of first carpometacarpal joint, right hand: Secondary | ICD-10-CM

## 2021-08-19 DIAGNOSIS — M159 Polyosteoarthritis, unspecified: Secondary | ICD-10-CM | POA: Diagnosis not present

## 2021-08-19 DIAGNOSIS — M1812 Unilateral primary osteoarthritis of first carpometacarpal joint, left hand: Secondary | ICD-10-CM

## 2021-08-19 DIAGNOSIS — M79641 Pain in right hand: Secondary | ICD-10-CM

## 2021-08-19 NOTE — Patient Instructions (Addendum)
We can try a procedure such as local steroid injection but I would recommend trial of conservative treatment first. If no improvement within a month or 2 we can see you back and try this.   For osteoarthritis of the hand several treatments may be beneficial: - Topical antiinflammatory medicine such as diclofenac or Voltaren can be applied to  affected area as needed but may be less effective than oral antiinflammatory medicine. Topical analgesics containing CBD, menthol, or lidocaine can be tried. Capsaicin containing treatments are recommended against for the hand.  - Oral nonsteroidal antiinflammatory medication such as ibuprofen, aleve, celebrex, or mobic are very helpful for osteoarthritis but can cause side effects such as stomach ulcers or hypertension with prolonged use. These should be taken intermittently or as needed, and always taken with food.  - Other oral supplements such as glucosamine chondroitin containing OTC treatments such as osteo bi-flex or other brands do not have strong data supporting effectiveness but can be helpful for some individuals and have no major side effects. Turmeric has some antiinflammatory effect similar to NSAID medications and may help, if taken as a supplement should not be taken above recommended doses.  - Compressive gloves can be helpful to support the thumb joint especially if hurting with certain activities.  - Occupational therapy referral can discuss exercises or activity modification to improve symptoms or strength if needed.  - Local steroid injection is an option if symptoms become worse and not controlled by the above options.

## 2021-08-28 ENCOUNTER — Other Ambulatory Visit: Payer: Self-pay

## 2021-08-28 ENCOUNTER — Ambulatory Visit: Payer: Medicare HMO | Admitting: Internal Medicine

## 2021-08-28 ENCOUNTER — Encounter: Payer: Self-pay | Admitting: Internal Medicine

## 2021-08-28 VITALS — BP 129/77 | HR 93 | Ht 65.0 in | Wt 197.8 lb

## 2021-08-28 DIAGNOSIS — R768 Other specified abnormal immunological findings in serum: Secondary | ICD-10-CM

## 2021-08-28 DIAGNOSIS — M79641 Pain in right hand: Secondary | ICD-10-CM

## 2021-08-28 DIAGNOSIS — M1990 Unspecified osteoarthritis, unspecified site: Secondary | ICD-10-CM | POA: Insufficient documentation

## 2021-08-28 DIAGNOSIS — M79642 Pain in left hand: Secondary | ICD-10-CM

## 2021-08-28 DIAGNOSIS — M1380 Other specified arthritis, unspecified site: Secondary | ICD-10-CM

## 2021-08-28 MED ORDER — METHYLPREDNISOLONE 4 MG PO TABS: ORAL_TABLET | ORAL | 0 refills | Status: DC

## 2021-08-28 NOTE — Progress Notes (Signed)
Office Visit Note  Patient: Erika Cervantes             Date of Birth: Mar 06, 1956           MRN: [id number]             PCP: Georgina Quint, MD Referring: Georgina Quint, * Visit Date: 08/28/2021   Subjective:  Other (Bilateral hand pain and swelling, left worse than right. )   History of Present Illness: Erika Cervantes is a 65 y.o. female here for follow up with hand pain and swelling at initial visit chronic appearing left 4th PIP joint changes seen and otherwise osteoarthritis in multiple sites but no specific inflammation to correlate with the previous high ESR. I recommened conservative treatment for now and can take another look for increased symptoms.  Since her visit she is experiencing worsening hand pain and swelling.  Particularly the left fourth digit is painful and enlarged with dark coloration.  She also cannot tightly close MCP and PIP joints of both hands.  Previous HPI 08/19/21 Erika Cervantes is a 65 y.o. female here for arthritis of bilateral hands with increase pain and swelling particularly at PIP and DIP joints.  She has had chronic osteoarthritis symptoms in multiple sites particularly in the lumbar spine and the left thumb in the past also some knee stiffness and crepitus.  But for the last about 5 months has increased hand pain morning stiffness and intermittent swelling.  Symptoms have forced her to stop wearing her rings most of the time due to swelling.  Worst affected is her left ring finger with swelling and hyperpigmentation at the PIP joint.  She weeks ago with some morning numbness has previously had carpal tunnel syndrome that improved with conservative management including wrist braces.  Outside of the hand pain she has had some increase in pain with the right shoulder particularly lying on her side in bed and increased clicking or popping of her knee use with sitting standing and when walking.  She has taken NSAIDs for this problem but limited use recommended  due to mild renal function impairment and due to GERD also has positive Cologuard study. Lab studies in PCP office last month showing low positive ANA and elevated sedimentation rate, negative rheumatoid factor and CCP.   Labs reviewed 06/2021 ANA 1:80 speckled RF neg CCP neg CBC wnl ESR 55 CMP eGFR 48.63   Review of Systems  Constitutional:  Positive for fatigue.  HENT:  Negative for mouth sores, mouth dryness and nose dryness.   Eyes:  Positive for dryness. Negative for pain and itching.  Respiratory:  Negative for shortness of breath and difficulty breathing.   Cardiovascular:  Negative for chest pain and palpitations.  Gastrointestinal:  Negative for blood in stool, constipation and diarrhea.  Endocrine: Negative for increased urination.  Genitourinary:  Negative for difficulty urinating.  Musculoskeletal:  Positive for joint pain, joint pain, joint swelling and morning stiffness. Negative for myalgias, muscle tenderness and myalgias.  Skin:  Negative for rash and redness.  Allergic/Immunologic: Negative for susceptible to infections.  Neurological:  Positive for numbness. Negative for dizziness, headaches and memory loss.  Hematological:  Negative for bruising/bleeding tendency.  Psychiatric/Behavioral:  Negative for confusion.    PMFS History:  Patient Active Problem List   Diagnosis Date Noted   Bilateral hand pain 08/28/2021   Positive ANA (antinuclear antibody) 08/28/2021   Inflammatory arthritis 08/28/2021   Primary osteoarthritis involving multiple joints 07/08/2021   Dyslipidemia 08/01/2019  Degenerative arthritis of thumb 09/04/2015   Vitamin D deficiency 05/26/2012   Hypothyroidism 02/26/2012   Hyperlipidemia with target LDL less than 100 02/26/2012   HTN (hypertension) 02/26/2012   Obesity (BMI 30.0-34.9) 02/26/2012   Bulging lumbar disc 02/26/2012   GERD (gastroesophageal reflux disease) 02/26/2012    Past Medical History:  Diagnosis Date   Allergy     GERD (gastroesophageal reflux disease)    Hypertension    PONV (postoperative nausea and vomiting)    Thyroid disease     Family History  Problem Relation Age of Onset   Heart disease Mother    Rheum arthritis Mother    Past Surgical History:  Procedure Laterality Date   ABDOMINAL HYSTERECTOMY  late 1980s   Fibroids were indication.  pt says also had SPO bil.     APPENDECTOMY  late 1980s   done at time of hysto.     BACK SURGERY     BREAST SURGERY     Right breast mass   ESOPHAGOGASTRODUODENOSCOPY (EGD) WITH PROPOFOL N/A 11/08/2018   Procedure: ESOPHAGOGASTRODUODENOSCOPY (EGD) WITH PROPOFOL;  Surgeon: Milus Banister, MD;  Location: [facility] ENDOSCOPY;  Service: Endoscopy;  Laterality: N/A;   Hammer toes     TONSILLECTOMY     Social History   Social History Narrative   Not on file   Immunization History  Administered Date(s) Administered   Fluad Quad(high Dose 65+) 07/08/2021   Influenza Split 08/28/2011   Influenza,inj,Quad PF,6+ Mos 10/06/2013, 07/10/2015, 07/15/2016, 10/24/2018, 08/01/2019   PFIZER(Purple Top)SARS-COV-2 Vaccination 01/09/2020, 01/29/2020, 09/03/2020   Pneumococcal Polysaccharide-23 08/01/2019   Td 10/26/2002   Tdap 05/26/2012     Objective: Vital Signs: BP 129/77 (BP Location: Right Arm, Patient Position: Sitting, Cuff Size: Large)   Pulse 93   Ht $R'5\' 5"'yr$  (1.651 m)   Wt 197 lb 12.8 oz (89.7 kg)   BMI 32.92 kg/m    Physical Exam Cardiovascular:     Rate and Rhythm: Normal rate and regular rhythm.  Pulmonary:     Effort: Pulmonary effort is normal.     Breath sounds: Normal breath sounds.  Skin:    General: Skin is warm and dry.  Neurological:     Mental Status: She is alert.  Psychiatric:        Mood and Affect: Mood normal.     Musculoskeletal Exam:  Shoulders full ROM no tenderness or swelling Elbows full ROM no tenderness or swelling Wrists full ROM no tenderness or swelling Unable to fully close finger grip tightly bilaterally,  tenderness and swelling of the left fourth PIP some tenderness in other proximal finger joints without palpable swelling Knees full ROM no tenderness or swelling   Investigation: No additional findings.  Imaging: No results found.  Recent Labs: Lab Results  Component Value Date   WBC 6.9 07/08/2021   HGB 12.3 07/08/2021   PLT 296.0 07/08/2021   NA 138 07/08/2021   K 4.1 07/08/2021   CL 105 07/08/2021   CO2 25 07/08/2021   GLUCOSE 92 07/08/2021   BUN 16 07/08/2021   CREATININE 1.18 07/08/2021   BILITOT 0.4 07/08/2021   ALKPHOS 45 07/08/2021   AST 18 07/08/2021   ALT 11 07/08/2021   PROT 7.7 07/08/2021   ALBUMIN 4.0 07/08/2021   CALCIUM 9.8 07/08/2021   GFRAA 61 10/29/2020    Speciality Comments: No specialty comments available.  Procedures:  No procedures performed Allergies: Darvocet [propoxyphene n-acetaminophen]   Assessment / Plan:     Visit Diagnoses: Bilateral  hand pain  Positive ANA (antinuclear antibody) - Plan: RNP Antibody, Anti-Smith antibody, Sjogrens syndrome-A extractable nuclear antibody, Anti-DNA antibody, double-stranded, C3 and C4, Sedimentation rate  Now with recurring joint pain definite inflammatory changes on exam today positive ANA otherwise normal baseline labs. Mostly left ring finger with objective changes.  We will check specific antibody markers serum complement and sedimentation rate today.  Seronegative inflammatory arthritis   For current symptoms Medrol Dosepak for 6 days see degree of symptom responsiveness we will follow-up after getting above lab work to recommend longer term treatment or DMARD.  Orders: Orders Placed This Encounter  Procedures   RNP Antibody   Anti-Smith antibody   Sjogrens syndrome-A extractable nuclear antibody   Anti-DNA antibody, double-stranded   C3 and C4   Sedimentation rate    Meds ordered this encounter  Medications   DISCONTD: methylPREDNISolone (MEDROL) 4 MG tablet    Sig: Take by mouth once  daily- day 1 6 tablets, day 2 5 tablets, day 3 4 tablets, day 4 3 tablets, day 5 2 tablets, and on day 6 1 tablet    Dispense:  21 tablet    Refill:  0      Follow-Up Instructions: Return in about 4 weeks (around 09/25/2021).   Collier Salina, MD  Note - This record has been created using [employer].  Chart creation errors have been sought, but may not always  have been located. Such creation errors do not reflect on  the standard of medical care.

## 2021-08-29 LAB — ANTI-SMITH ANTIBODY: ENA SM Ab Ser-aCnc: <1 AI

## 2021-08-29 LAB — SJOGRENS SYNDROME-A EXTRACTABLE NUCLEAR ANTIBODY: SSA (Ro) (ENA) Antibody, IgG: <1 AI

## 2021-08-29 LAB — ANTI-DNA ANTIBODY, DOUBLE-STRANDED: ds DNA Ab: 1 IU/mL

## 2021-08-29 LAB — RNP ANTIBODY: Ribonucleic Protein(ENA) Antibody, IgG: 1.2 AI — AB

## 2021-08-29 LAB — SEDIMENTATION RATE: Sed Rate: 34 mm/h — ABNORMAL HIGH (ref 0–30)

## 2021-08-29 LAB — C3 AND C4
C3 Complement: 163 mg/dL (ref 83–193)
C4 Complement: 44 mg/dL (ref 15–57)

## 2021-09-24 ENCOUNTER — Other Ambulatory Visit: Payer: Self-pay

## 2021-09-24 ENCOUNTER — Encounter: Payer: Self-pay | Admitting: Internal Medicine

## 2021-09-24 ENCOUNTER — Ambulatory Visit: Payer: Medicare HMO | Admitting: Internal Medicine

## 2021-09-24 VITALS — BP 164/77 | HR 103 | Resp 15 | Ht 65.0 in | Wt 201.0 lb

## 2021-09-24 DIAGNOSIS — M79642 Pain in left hand: Secondary | ICD-10-CM

## 2021-09-24 DIAGNOSIS — M1990 Unspecified osteoarthritis, unspecified site: Secondary | ICD-10-CM

## 2021-09-24 DIAGNOSIS — M159 Polyosteoarthritis, unspecified: Secondary | ICD-10-CM

## 2021-09-24 DIAGNOSIS — M79641 Pain in right hand: Secondary | ICD-10-CM | POA: Diagnosis not present

## 2021-09-24 DIAGNOSIS — M1380 Other specified arthritis, unspecified site: Secondary | ICD-10-CM

## 2021-09-24 MED ORDER — METHYLPREDNISOLONE 4 MG PO TABS: ORAL_TABLET | ORAL | 0 refills | Status: DC

## 2021-09-24 MED ORDER — COLCHICINE 0.6 MG PO TABS: 0.6000 mg | ORAL_TABLET | Freq: Every day | ORAL | 1 refills | Status: DC

## 2021-09-24 NOTE — Patient Instructions (Addendum)
I recommend stopping any advil or aleve medications while taking the medrol and the colchicine.

## 2021-09-24 NOTE — Progress Notes (Signed)
Office Visit Note  Patient: Erika Cervantes             Date of Birth: 02-19-1956           MRN: [id number]             PCP: Horald Pollen, MD Referring: Horald Pollen, * Visit Date: 09/24/2021   Subjective:  Pain of the Left Hand (Swelling)    History of Present Illness: Erika Cervantes is a 65 y.o. female here for follow up for hand pain and swelling left ring finger discoloration and swelling improved with oral steroids but symptoms quickly returned after stopping the medication. She also has stiffness and difficulty fully closing her grip with other finger joints. Right hand is affected less severely than left. Overall about 3 months total of off and on symptoms she is having a lot of stress due to pain and difficulty doing her regular tasks.  Previous HPI 08/28/21 Erika Cervantes is a 65 y.o. female here for follow up with hand pain and swelling at initial visit chronic appearing left 4th PIP joint changes seen and otherwise osteoarthritis in multiple sites but no specific inflammation to correlate with the previous high ESR. I recommened conservative treatment for now and can take another look for increased symptoms.  Since her visit she is experiencing worsening hand pain and swelling.  Particularly the left fourth digit is painful and enlarged with dark coloration.  She also cannot tightly close MCP and PIP joints of both hands.    Previous HPI 08/19/21 Erika Cervantes is a 65 y.o. female here for arthritis of bilateral hands with increase pain and swelling particularly at PIP and DIP joints.  She has had chronic osteoarthritis symptoms in multiple sites particularly in the lumbar spine and the left thumb in the past also some knee stiffness and crepitus.  But for the last about 5 months has increased hand pain morning stiffness and intermittent swelling.  Symptoms have forced her to stop wearing her rings most of the time due to swelling.  Worst affected is her left ring finger with  swelling and hyperpigmentation at the PIP joint.  She weeks ago with some morning numbness has previously had carpal tunnel syndrome that improved with conservative management including wrist braces.  Outside of the hand pain she has had some increase in pain with the right shoulder particularly lying on her side in bed and increased clicking or popping of her knee use with sitting standing and when walking.  She has taken NSAIDs for this problem but limited use recommended due to mild renal function impairment and due to GERD also has positive Cologuard study. Lab studies in PCP office last month showing low positive ANA and elevated sedimentation rate, negative rheumatoid factor and CCP.   Review of Systems  Constitutional:  Positive for fatigue.  HENT:  Negative for mouth dryness.   Eyes:  Positive for dryness.  Respiratory:  Positive for shortness of breath.   Cardiovascular:  Negative for swelling in legs/feet.  Gastrointestinal:  Negative for constipation.  Endocrine: Negative for increased urination.  Genitourinary:  Negative for difficulty urinating.  Musculoskeletal:  Positive for joint pain, joint pain, joint swelling, muscle weakness, morning stiffness and muscle tenderness.  Skin:  Negative for rash.  Allergic/Immunologic: Negative for susceptible to infections.  Neurological:  Positive for numbness and weakness.  Hematological:  Negative for bruising/bleeding tendency.  Psychiatric/Behavioral:  Positive for sleep disturbance.    PMFS History:  Patient Active Problem  List   Diagnosis Date Noted   Bilateral hand pain 08/28/2021   Positive ANA (antinuclear antibody) 08/28/2021   Inflammatory arthritis 08/28/2021   Primary osteoarthritis involving multiple joints 07/08/2021   Dyslipidemia 08/01/2019   Degenerative arthritis of thumb 09/04/2015   Vitamin D deficiency 05/26/2012   Hypothyroidism 02/26/2012   Hyperlipidemia with target LDL less than 100 02/26/2012   HTN  (hypertension) 02/26/2012   Obesity (BMI 30.0-34.9) 02/26/2012   Bulging lumbar disc 02/26/2012   GERD (gastroesophageal reflux disease) 02/26/2012    Past Medical History:  Diagnosis Date   Allergy    GERD (gastroesophageal reflux disease)    Hypertension    PONV (postoperative nausea and vomiting)    Thyroid disease     Family History  Problem Relation Age of Onset   Heart disease Mother    Rheum arthritis Mother    Past Surgical History:  Procedure Laterality Date   ABDOMINAL HYSTERECTOMY  late 1980s   Fibroids were indication.  pt says also had SPO bil.     APPENDECTOMY  late 1980s   done at time of hysto.     BACK SURGERY     BREAST SURGERY     Right breast mass   ESOPHAGOGASTRODUODENOSCOPY (EGD) WITH PROPOFOL N/A 11/08/2018   Procedure: ESOPHAGOGASTRODUODENOSCOPY (EGD) WITH PROPOFOL;  Surgeon: Milus Banister, MD;  Location: [facility] ENDOSCOPY;  Service: Endoscopy;  Laterality: N/A;   Hammer toes     TONSILLECTOMY     Social History   Social History Narrative   Not on file   Immunization History  Administered Date(s) Administered   Fluad Quad(high Dose 65+) 07/08/2021   Influenza Split 08/28/2011   Influenza,inj,Quad PF,6+ Mos 10/06/2013, 07/10/2015, 07/15/2016, 10/24/2018, 08/01/2019   PFIZER(Purple Top)SARS-COV-2 Vaccination 01/09/2020, 01/29/2020, 09/03/2020   Pneumococcal Polysaccharide-23 08/01/2019   Td 10/26/2002   Tdap 05/26/2012     Objective: Vital Signs: BP (!) 164/77 (BP Location: Left Arm, Patient Position: Sitting, Cuff Size: Normal)   Pulse (!) 103   Resp 15   Ht $R'5\' 5"'zs$  (1.651 m)   Wt 201 lb (91.2 kg)   BMI 33.45 kg/m    Physical Exam Cardiovascular:     Rate and Rhythm: Regular rhythm. Tachycardia present.  Pulmonary:     Effort: Pulmonary effort is normal.     Breath sounds: Normal breath sounds.  Musculoskeletal:     Right lower leg: No edema.     Left lower leg: No edema.  Skin:    General: Skin is warm and dry.  Neurological:      Mental Status: She is alert.  Psychiatric:        Mood and Affect: Mood normal.     Musculoskeletal Exam:  Elbows full ROM no tenderness or swelling Wrists full ROM no tenderness or swelling Fingers not able to tightly grip with either hand left is worse, left 4th PIP swollen and hyperpigmented with very reduced flexion ROM Knees full ROM no tenderness or swelling Ankles full ROM no tenderness or swelling    Investigation: No additional findings.  Imaging: No results found.  Recent Labs: Lab Results  Component Value Date   WBC 6.9 07/08/2021   HGB 12.3 07/08/2021   PLT 296.0 07/08/2021   NA 138 07/08/2021   K 4.1 07/08/2021   CL 105 07/08/2021   CO2 25 07/08/2021   GLUCOSE 92 07/08/2021   BUN 16 07/08/2021   CREATININE 1.18 07/08/2021   BILITOT 0.4 07/08/2021   ALKPHOS 45 07/08/2021  AST 18 07/08/2021   ALT 11 07/08/2021   PROT 7.7 07/08/2021   ALBUMIN 4.0 07/08/2021   CALCIUM 9.8 07/08/2021   GFRAA 61 10/29/2020    Speciality Comments: No specialty comments available.  Procedures:  No procedures performed Allergies: Darvocet [propoxyphene n-acetaminophen]   Assessment / Plan:     Visit Diagnoses: Inflammatory arthritis  Seronegative inflammatory arthritis - Plan: methylPREDNISolone (MEDROL) 4 MG tablet, colchicine 0.6 MG tablet  Definite joint inflammation present on exam again today labs mostly unremarkable mildly high sed rate and positive ANA. Xray suggestive for some periarticular calcification but no erosions no chondrocalcinosis. Will try starting daily colchicine after initial steroid taper possible calcific periarthritis process.  Primary osteoarthritis involving multiple joints  There is mild OA of bilateral hands I don't suspect this explains current symptoms does not appear consistent with erosive OA of the hands either.   Orders: No orders of the defined types were placed in this encounter.   Meds ordered this encounter  Medications    methylPREDNISolone (MEDROL) 4 MG tablet    Sig: Take by mouth once daily- day 1 6 tablets, day 2 5 tablets, day 3 4 tablets, day 4 3 tablets, day 5 2 tablets, and on day 6 1 tablet    Dispense:  21 tablet    Refill:  0   colchicine 0.6 MG tablet    Sig: Take 1 tablet (0.6 mg total) by mouth daily.    Dispense:  30 tablet    Refill:  1      Follow-Up Instructions: Return in about 6 weeks (around 11/05/2021) for ?Calcific periarthritis Colchicine/medrol f/u 2mos.   Collier Salina, MD  Note - This record has been created using [employer].  Chart creation errors have been sought, but may not always  have been located. Such creation errors do not reflect on  the standard of medical care.

## 2021-10-13 ENCOUNTER — Telehealth: Payer: Self-pay | Admitting: *Deleted

## 2021-10-13 NOTE — Telephone Encounter (Signed)
Patient contacted the office stating she is having swelling in her fingers in her left hand again. Patient the fingers got tight last night, patient states it was swollen and tight this morning. Patient states she was given a prescription for Colchicine. Patient states she took that this morning and has been taking it daily since completing the Medrol. Patient would like to know what you would like her to do regarding this. Please advise.

## 2021-10-15 NOTE — Telephone Encounter (Signed)
FYI- I spoke with Ms. Nolden she improved on the medrol again but now taking just the colchicine once daily she is having improvement each time but develops more swelling and stiffness again overnight and in the morning. She has not noticed any GI side effects. I recommend she try increasing to 1 tablet twice daily and see if there is more improvement. She has mild renal impairment, so we will need to recheck labs in follow up early next month for monitoring.

## 2021-11-04 NOTE — Progress Notes (Signed)
Office Visit Note  Patient: Erika Cervantes             Date of Birth: [birth year]           MRN: [id number]             PCP: Horald Pollen, MD Referring: Horald Pollen, * Visit Date: 11/05/2021   Subjective:  Other (Patient reports bilateral hand pain, stiffness and "tightness" )   History of Present Illness: Erika Cervantes is a 66 y.o. female here for follow up for hand pain and swelling trying treatment with colchicine. She had initial response with medrol taper and then felt partially better on colchicine. She tried increasing colchicine to 0.6 mg twice daily due to ongoing symptoms and good improvement in hand pain and swelling. She continues to have a lot of stiffness both in her hands and all over, worst first thing in the mornings. She notices some catching or locking movement in the right middle finger and now has more symptoms on the right instead of the left.  Previous HPI 09/24/21 Erika Cervantes is a 66 y.o. female here for follow up for hand pain and swelling left ring finger discoloration and swelling improved with oral steroids but symptoms quickly returned after stopping the medication. She also has stiffness and difficulty fully closing her grip with other finger joints. Right hand is affected less severely than left. Overall about 3 months total of off and on symptoms she is having a lot of stress due to pain and difficulty doing her regular tasks.   Previous HPI 08/19/21 Erika Cervantes is a 66 y.o. female here for arthritis of bilateral hands with increase pain and swelling particularly at PIP and DIP joints.  She has had chronic osteoarthritis symptoms in multiple sites particularly in the lumbar spine and the left thumb in the past also some knee stiffness and crepitus.  But for the last about 5 months has increased hand pain morning stiffness and intermittent swelling.  Symptoms have forced her to stop wearing her rings most of the time due to swelling.  Worst affected is  her left ring finger with swelling and hyperpigmentation at the PIP joint.  She weeks ago with some morning numbness has previously had carpal tunnel syndrome that improved with conservative management including wrist braces.  Outside of the hand pain she has had some increase in pain with the right shoulder particularly lying on her side in bed and increased clicking or popping of her knee use with sitting standing and when walking. She has taken NSAIDs for this problem but limited use recommended due to mild renal function impairment and due to GERD also has positive Cologuard study. Lab studies in PCP office last month showing low positive ANA and elevated sedimentation rate, negative rheumatoid factor and CCP.   Review of Systems  Constitutional:  Positive for fatigue.  HENT:  Negative for mouth sores, mouth dryness and nose dryness.   Eyes:  Positive for pain, itching and dryness.  Respiratory:  Negative for shortness of breath and difficulty breathing.   Cardiovascular:  Negative for chest pain and palpitations.  Gastrointestinal:  Negative for blood in stool, constipation and diarrhea.  Endocrine: Negative for increased urination.  Genitourinary:  Negative for difficulty urinating.  Musculoskeletal:  Positive for joint pain, joint pain and morning stiffness. Negative for joint swelling, myalgias, muscle tenderness and myalgias.  Skin:  Negative for color change, rash and redness.  Allergic/Immunologic: Negative for susceptible to infections.  Neurological:  Positive for numbness. Negative for dizziness, headaches, memory loss and weakness.  Hematological:  Negative for bruising/bleeding tendency.  Psychiatric/Behavioral:  Negative for confusion.    PMFS History:  Patient Active Problem List   Diagnosis Date Noted   Bilateral hand pain 08/28/2021   Positive ANA (antinuclear antibody) 08/28/2021   Inflammatory arthritis 08/28/2021   Primary osteoarthritis involving multiple joints  07/08/2021   Dyslipidemia 08/01/2019   Degenerative arthritis of thumb 09/04/2015   Vitamin D deficiency 05/26/2012   Hypothyroidism 02/26/2012   Hyperlipidemia with target LDL less than 100 02/26/2012   HTN (hypertension) 02/26/2012   Obesity (BMI 30.0-34.9) 02/26/2012   Bulging lumbar disc 02/26/2012   GERD (gastroesophageal reflux disease) 02/26/2012    Past Medical History:  Diagnosis Date   Allergy    GERD (gastroesophageal reflux disease)    Hypertension    PONV (postoperative nausea and vomiting)    Thyroid disease     Family History  Problem Relation Age of Onset   Heart disease Mother    Rheum arthritis Mother    Past Surgical History:  Procedure Laterality Date   ABDOMINAL HYSTERECTOMY  late 1980s   Fibroids were indication.  pt says also had SPO bil.     APPENDECTOMY  late 1980s   done at time of hysto.     BACK SURGERY     BREAST SURGERY     Right breast mass   ESOPHAGOGASTRODUODENOSCOPY (EGD) WITH PROPOFOL N/A 11/08/2018   Procedure: ESOPHAGOGASTRODUODENOSCOPY (EGD) WITH PROPOFOL;  Surgeon: Milus Banister, MD;  Location: [facility] ENDOSCOPY;  Service: Endoscopy;  Laterality: N/A;   Hammer toes     TONSILLECTOMY     Social History   Social History Narrative   Not on file   Immunization History  Administered Date(s) Administered   Fluad Quad(high Dose 65+) 07/08/2021   Influenza Split 08/28/2011   Influenza,inj,Quad PF,6+ Mos 10/06/2013, 07/10/2015, 07/15/2016, 10/24/2018, 08/01/2019   PFIZER(Purple Top)SARS-COV-2 Vaccination 01/09/2020, 01/29/2020, 09/03/2020   Pneumococcal Polysaccharide-23 08/01/2019   Td 10/26/2002   Tdap 05/26/2012     Objective: Vital Signs: BP 138/80 (BP Location: Right Arm, Patient Position: Sitting, Cuff Size: Large)    Pulse 85    Ht _0  (1.651 m)    Wt 198 lb 3.2 oz (89.9 kg)    BMI 32.98 kg/m    Physical Exam Cardiovascular:     Rate and Rhythm: Normal rate and regular rhythm.  Pulmonary:     Effort: Pulmonary effort  is normal.     Breath sounds: Normal breath sounds.  Skin:    General: Skin is warm and dry.  Neurological:     General: No focal deficit present.     Mental Status: She is alert.  Psychiatric:        Mood and Affect: Mood normal.     Musculoskeletal Exam:  Neck full ROM no tenderness Shoulders full ROM no tenderness or swelling Elbows full ROM no tenderness or swelling Wrists full ROM no tenderness or swelling Fingers with mild heberdon's nodes, no palpable contractures or flexor tendon nodules, left 4th PIP with hyperpigmentation no tenderness or swelling Knees full ROM no tenderness or swelling  Investigation: No additional findings.  Imaging: No results found.  Recent Labs: Lab Results  Component Value Date   WBC 6.9 07/08/2021   HGB 12.3 07/08/2021   PLT 296.0 07/08/2021   NA 141 11/05/2021   K 4.0 11/05/2021   CL 105 11/05/2021   CO2 30 11/05/2021   GLUCOSE  81 11/05/2021   BUN 15 11/05/2021   CREATININE 1.12 (H) 11/05/2021   BILITOT 0.6 11/05/2021   ALKPHOS 45 07/08/2021   AST 20 11/05/2021   ALT 13 11/05/2021   PROT 7.1 11/05/2021   ALBUMIN 4.0 07/08/2021   CALCIUM 9.8 11/05/2021   GFRAA 61 10/29/2020    Speciality Comments: No specialty comments available.  Procedures:  No procedures performed Allergies: Darvocet [propoxyphene n-acetaminophen]   Assessment / Plan:     Visit Diagnoses: Bilateral hand pain Inflammatory arthritis - Plan: Sedimentation rate, COMPLETE METABOLIC PANEL WITH GFR  Symptoms are improved significantly but still having daily stiffness. Swelling is much improved though. No obvious new signs or symptoms for a systemic inflammatory problem. Will recheck ESR to see degree if improvement and checking CMP for medication toxicity monitoring before continuing the colchicine for longer.  Primary osteoarthritis involving multiple joints  Also with mostly mild chronic degenerative joint changes. The overall stiffness may be related to  this problem if tests show less inflammation.  Positive ANA (antinuclear antibody)  Very low positive RNP Abs last yeah again with no weakness, no raynaud's, no skin lesions plan to continue antiinflammatory medications no new DMARD treatment at this time.   Orders: Orders Placed This Encounter  Procedures   Sedimentation rate   COMPLETE METABOLIC PANEL WITH GFR   Meds ordered this encounter  Medications   colchicine 0.6 MG tablet    Sig: Take 1 tablet (0.6 mg total) by mouth 2 (two) times daily.    Dispense:  60 tablet    Refill:  2     Follow-Up Instructions: Return in about 3 months (around 02/03/2022) for Hard arthritis colchicine f/u 51mo.   CCollier Salina MD  Note - This record has been created using [employer]  Chart creation errors have been sought, but may not always  have been located. Such creation errors do not reflect on  the standard of medical care.

## 2021-11-05 ENCOUNTER — Ambulatory Visit: Payer: Medicare HMO | Admitting: Internal Medicine

## 2021-11-05 ENCOUNTER — Encounter: Payer: Self-pay | Admitting: Internal Medicine

## 2021-11-05 ENCOUNTER — Other Ambulatory Visit: Payer: Self-pay

## 2021-11-05 VITALS — BP 138/80 | HR 85 | Ht 65.0 in | Wt 198.2 lb

## 2021-11-05 DIAGNOSIS — M79642 Pain in left hand: Secondary | ICD-10-CM | POA: Diagnosis not present

## 2021-11-05 DIAGNOSIS — R768 Other specified abnormal immunological findings in serum: Secondary | ICD-10-CM

## 2021-11-05 DIAGNOSIS — M1990 Unspecified osteoarthritis, unspecified site: Secondary | ICD-10-CM

## 2021-11-05 DIAGNOSIS — M79641 Pain in right hand: Secondary | ICD-10-CM | POA: Diagnosis not present

## 2021-11-05 DIAGNOSIS — M159 Polyosteoarthritis, unspecified: Secondary | ICD-10-CM | POA: Diagnosis not present

## 2021-11-05 LAB — COMPLETE METABOLIC PANEL WITH GFR
AG Ratio: 1.4 (calc) (ref 1.0–2.5)
ALT: 13 U/L (ref 6–29)
AST: 20 U/L (ref 10–35)
Albumin: 4.2 g/dL (ref 3.6–5.1)
Alkaline phosphatase (APISO): 40 U/L (ref 37–153)
BUN/Creatinine Ratio: 13 (calc) (ref 6–22)
BUN: 15 mg/dL (ref 7–25)
CO2: 30 mmol/L (ref 20–32)
Calcium: 9.8 mg/dL (ref 8.6–10.4)
Chloride: 105 mmol/L (ref 98–110)
Creat: 1.12 mg/dL — ABNORMAL HIGH (ref 0.50–1.05)
Globulin: 2.9 g/dL (calc) (ref 1.9–3.7)
Glucose, Bld: 81 mg/dL (ref 65–99)
Potassium: 4 mmol/L (ref 3.5–5.3)
Sodium: 141 mmol/L (ref 135–146)
Total Bilirubin: 0.6 mg/dL (ref 0.2–1.2)
Total Protein: 7.1 g/dL (ref 6.1–8.1)
eGFR: 55 mL/min/{1.73_m2} — ABNORMAL LOW (ref >=60)

## 2021-11-05 LAB — SEDIMENTATION RATE: Sed Rate: 22 mm/h (ref 0–30)

## 2021-11-06 MED ORDER — COLCHICINE 0.6 MG PO TABS: 0.6000 mg | ORAL_TABLET | Freq: Two times a day (BID) | ORAL | 2 refills | Status: DC

## 2021-11-06 NOTE — Progress Notes (Signed)
Lab test shows no change or problem with kidney function on the current colchicine. She can continue taking this 1 tablet twice daily as long as she doesn't have problems with it. Sedimentation rate is back down to normal indicating improvement in inflammation.

## 2021-11-19 ENCOUNTER — Other Ambulatory Visit: Payer: Self-pay | Admitting: Internal Medicine

## 2021-11-19 DIAGNOSIS — M1990 Unspecified osteoarthritis, unspecified site: Secondary | ICD-10-CM

## 2021-11-19 DIAGNOSIS — M79641 Pain in right hand: Secondary | ICD-10-CM

## 2021-11-24 ENCOUNTER — Other Ambulatory Visit: Payer: Self-pay

## 2021-11-24 ENCOUNTER — Encounter: Payer: Self-pay | Admitting: Emergency Medicine

## 2021-11-24 ENCOUNTER — Ambulatory Visit (INDEPENDENT_AMBULATORY_CARE_PROVIDER_SITE_OTHER): Payer: Medicare HMO | Admitting: Emergency Medicine

## 2021-11-24 VITALS — BP 132/78 | HR 100 | Temp 98.4°F | Ht 65.0 in | Wt 198.0 lb

## 2021-11-24 DIAGNOSIS — R051 Acute cough: Secondary | ICD-10-CM

## 2021-11-24 DIAGNOSIS — R0989 Other specified symptoms and signs involving the circulatory and respiratory systems: Secondary | ICD-10-CM

## 2021-11-24 DIAGNOSIS — J22 Unspecified acute lower respiratory infection: Secondary | ICD-10-CM | POA: Diagnosis not present

## 2021-11-24 DIAGNOSIS — R062 Wheezing: Secondary | ICD-10-CM | POA: Diagnosis not present

## 2021-11-24 DIAGNOSIS — R0981 Nasal congestion: Secondary | ICD-10-CM

## 2021-11-24 MED ORDER — PREDNISONE 20 MG PO TABS: 20.0000 mg | ORAL_TABLET | Freq: Every day | ORAL | 0 refills | Status: AC

## 2021-11-24 MED ORDER — DM-GUAIFENESIN ER 30-600 MG PO TB12: 1.0000 | ORAL_TABLET | Freq: Two times a day (BID) | ORAL | 1 refills | Status: DC

## 2021-11-24 MED ORDER — AZITHROMYCIN 250 MG PO TABS: ORAL_TABLET | ORAL | 0 refills | Status: DC

## 2021-11-24 NOTE — Patient Instructions (Signed)

## 2021-11-24 NOTE — Progress Notes (Signed)
Erika Cervantes 66 y.o.   Chief Complaint  Patient presents with   Cough    Sinus pressure and nasal congestion, x 1 week    HISTORY OF PRESENT ILLNESS: Acute problem visit today. This is a 66 y.o. female complaining of 1 week history of sinus pressure and nasal congestion with chest congestion and productive cough progressively getting worse.  Denies difficulty breathing but noticed some wheezing.  Denies chest pain.  Able to eat and drink.  Denies nausea or vomiting.  Denies abdominal pain or diarrhea.  Non-smoker. No other associated symptoms.  Cough Associated symptoms include wheezing. Pertinent negatives include no chest pain, chills, fever, headaches, hemoptysis, rash, sore throat or shortness of breath.    Prior to Admission medications   Medication Sig Start Date End Date Taking? Authorizing Provider  acetaminophen (TYLENOL) 650 MG CR tablet Take 650 mg by mouth every 8 (eight) hours as needed for pain.   Yes [provider]  albuterol (PROVENTIL HFA;VENTOLIN HFA) 108 (90 Base) MCG/ACT inhaler Inhale 2 puffs into the lungs every 6 (six) hours as needed. 12/07/17  Yes Jaynee Eagles, PA-C  colchicine 0.6 MG tablet TAKE ONE TABLET BY MOUTH DAILY 11/19/21  Yes Rice, Resa Miner, MD  levothyroxine (SYNTHROID) 100 MCG tablet Take 1 tablet (100 mcg total) by mouth daily with breakfast. 07/08/21 11/24/21 Yes [name], Ines Bloomer, MD  lisinopril-hydrochlorothiazide (ZESTORETIC) 20-12.5 MG tablet Take 1 tablet by mouth daily. 07/08/21 11/24/21 Yes Angus Amini, Ines Bloomer, MD  omeprazole (PRILOSEC) 20 MG capsule TAKE ONE CAPSULE BY MOUTH DAILY 04/16/21  Yes Horald Pollen, MD  ondansetron (ZOFRAN ODT) 4 MG disintegrating tablet Take 1 tablet (4 mg total) by mouth every 8 (eight) hours as needed for nausea or vomiting. 10/25/20  Yes Jilliann Subramanian, Ines Bloomer, MD  rosuvastatin (CRESTOR) 20 MG tablet Take 1 tablet (20 mg total) by mouth daily. 10/24/20  Yes Horald Pollen, MD     Allergies  Allergen Reactions   Darvocet [Propoxyphene N-Acetaminophen] Nausea And Vomiting    Patient Active Problem List   Diagnosis Date Noted   Bilateral hand pain 08/28/2021   Positive ANA (antinuclear antibody) 08/28/2021   Inflammatory arthritis 08/28/2021   Primary osteoarthritis involving multiple joints 07/08/2021   Dyslipidemia 08/01/2019   Degenerative arthritis of thumb 09/04/2015   Vitamin D deficiency 05/26/2012   Hypothyroidism 02/26/2012   Hyperlipidemia with target LDL less than 100 02/26/2012   HTN (hypertension) 02/26/2012   Obesity (BMI 30.0-34.9) 02/26/2012   Bulging lumbar disc 02/26/2012   GERD (gastroesophageal reflux disease) 02/26/2012    Past Medical History:  Diagnosis Date   Allergy    GERD (gastroesophageal reflux disease)    Hypertension    PONV (postoperative nausea and vomiting)    Thyroid disease     Past Surgical History:  Procedure Laterality Date   ABDOMINAL HYSTERECTOMY  late 1980s   Fibroids were indication.  pt says also had SPO bil.     APPENDECTOMY  late 1980s   done at time of hysto.     BACK SURGERY     BREAST SURGERY     Right breast mass   ESOPHAGOGASTRODUODENOSCOPY (EGD) WITH PROPOFOL N/A 11/08/2018   Procedure: ESOPHAGOGASTRODUODENOSCOPY (EGD) WITH PROPOFOL;  Surgeon: Milus Banister, MD;  Location: [facility] ENDOSCOPY;  Service: Endoscopy;  Laterality: N/A;   Hammer toes     TONSILLECTOMY      Social History   Socioeconomic History   Marital status: Married    Spouse name: Not on  file   Number of children: Not on file   Years of education: Not on file   Highest education level: Not on file  Occupational History   Not on file  Tobacco Use   Smoking status: Every Day    Packs/day: 0.10    Years: 22.00    Pack years: 2.20    Types: Cigarettes   Smokeless tobacco: Never   Tobacco comments:    3 cigs daiy  Vaping Use   Vaping Use: Never used  Substance and Sexual Activity   Alcohol use: No   Drug use: No    Sexual activity: Not on file  Other Topics Concern   Not on file  Social History Narrative   Not on file   Social Determinants of Health   Financial Resource Strain: Not on file  Food Insecurity: No Food Insecurity   Worried About Running Out of Food in the Last Year: Never true   Ran Out of Food in the Last Year: Never true  Transportation Needs: No Transportation Needs   Lack of Transportation (Medical): No   Lack of Transportation (Non-Medical): No  Physical Activity: Not on file  Stress: Not on file  Social Connections: Not on file  Intimate Partner Violence: Not on file    Family History  Problem Relation Age of Onset   Heart disease Mother    Rheum arthritis Mother      Review of Systems  Constitutional: Negative.  Negative for chills and fever.  HENT:  Positive for congestion. Negative for sore throat.   Respiratory:  Positive for cough, sputum production and wheezing. Negative for hemoptysis and shortness of breath.   Cardiovascular: Negative.  Negative for chest pain and palpitations.  Gastrointestinal:  Negative for abdominal pain, diarrhea, nausea and vomiting.  Skin: Negative.  Negative for rash.  Neurological: Negative.  Negative for dizziness and headaches.  All other systems reviewed and are negative. Today's Vitals   11/24/21 1502  BP: 132/78  Pulse: 100  Temp: 98.4 F (36.9 C)  TempSrc: Oral  SpO2: 95%  Weight: 198 lb (89.8 kg)  Height: 5\' 5"  (1.651 m)   Body mass index is 32.95 kg/m.   Physical Exam Vitals reviewed.  Constitutional:      Appearance: Normal appearance.  HENT:     Head: Normocephalic.  Eyes:     Extraocular Movements: Extraocular movements intact.     Conjunctiva/sclera: Conjunctivae normal.     Pupils: Pupils are equal, round, and reactive to light.  Cardiovascular:     Rate and Rhythm: Normal rate and regular rhythm.     Pulses: Normal pulses.     Heart sounds: Normal heart sounds.  Pulmonary:     Effort:  Pulmonary effort is normal.     Breath sounds: Wheezing present. No rales.  Musculoskeletal:     Cervical back: Normal range of motion and neck supple.  Skin:    General: Skin is warm and dry.     Capillary Refill: Capillary refill takes less than 2 seconds.  Neurological:     General: No focal deficit present.     Mental Status: She is alert and oriented to person, place, and time.  Psychiatric:        Mood and Affect: Mood normal.        Behavior: Behavior normal.     ASSESSMENT & PLAN: Clinically stable.  No red flag signs or symptoms. Viral respiratory infection complicated with secondary bacterial infection.  Will benefit from  antibiotics at this time. Bronchospasm on exam.  Will benefit from systemic corticosteroids.  Cough management discussed.  Advised to rest and stay well-hydrated.  Advised to contact the office if no better or worse during the next several days.  Problem List Items Addressed This Visit   None Visit Diagnoses     Lower respiratory infection    -  Primary   Relevant Medications   azithromycin (ZITHROMAX) 250 MG tablet   Acute cough       Relevant Medications   dextromethorphan-guaiFENesin (MUCINEX DM) 30-600 MG 12hr tablet   Sinus congestion       Relevant Medications   dextromethorphan-guaiFENesin (MUCINEX DM) 30-600 MG 12hr tablet   Chest congestion       Relevant Medications   dextromethorphan-guaiFENesin (MUCINEX DM) 30-600 MG 12hr tablet   Wheezing       Relevant Medications   predniSONE (DELTASONE) 20 MG tablet      Patient Instructions  Cough, Adult A cough helps to clear your throat and lungs. A cough may be a sign of an illness or another medical condition. An acute cough may only last 2-3 weeks, while a chronic cough may last 8 or more weeks. Many things can cause a cough. They include: Germs (viruses or bacteria) that attack the airway. Breathing in things that bother (irritate) your lungs. Allergies. Asthma. Mucus that runs  down the back of your throat (postnasal drip). Smoking. Acid backing up from the stomach into the tube that moves food from the mouth to the stomach (gastroesophageal reflux). Some medicines. Lung problems. Other medical conditions, such as heart failure or a blood clot in the lung (pulmonary embolism). Follow these instructions at home: Medicines Take over-the-counter and prescription medicines only as told by your doctor. Talk with your doctor before you take medicines that stop a cough (cough suppressants). Lifestyle  Do not smoke, and try not to be around smoke. Do not use any products that contain nicotine or tobacco, such as cigarettes, e-cigarettes, and chewing tobacco. If you need help quitting, ask your doctor. Drink enough fluid to keep your pee (urine) pale yellow. Avoid caffeine. Do not drink alcohol if your doctor tells you not to drink. General instructions  Watch for any changes in your cough. Tell your doctor about them. Always cover your mouth when you cough. Stay away from things that make you cough, such as perfume, candles, campfire smoke, or cleaning products. If the air is dry, use a cool mist vaporizer or humidifier in your home. If your cough is worse at night, try using extra pillows to raise your head up higher while you sleep. Rest as needed. Keep all follow-up visits as told by your doctor. This is important. Contact a doctor if: You have new symptoms. You cough up pus. Your cough does not get better after 2-3 weeks, or your cough gets worse. Cough medicine does not help your cough and you are not sleeping well. You have pain that gets worse or pain that is not helped with medicine. You have a fever. You are losing weight and you do not know why. You have night sweats. Get help right away if: You cough up blood. You have trouble breathing. Your heartbeat is very fast. These symptoms may be an emergency. Do not wait to see if the symptoms will go away.  Get medical help right away. Call your local emergency services (911 in the [place] Do not drive yourself to the hospital. Summary A cough helps to  clear your throat and lungs. Many things can cause a cough. Take over-the-counter and prescription medicines only as told by your doctor. Always cover your mouth when you cough. Contact a doctor if you have new symptoms or you have a cough that does not get better or gets worse. This information is not intended to replace advice given to you by your health care provider. Make sure you discuss any questions you have with your health care provider. Document Revised: 12/01/2019 Document Reviewed: 10/31/2018 Elsevier Patient Education  2022 [place], MD [facility]

## 2021-11-25 ENCOUNTER — Telehealth: Payer: Self-pay

## 2021-11-25 NOTE — Telephone Encounter (Signed)
Patient called requesting prescription refill of Colchicine to be sent to [place] [facility].

## 2021-11-25 NOTE — Telephone Encounter (Signed)
Left message to advise patient her prescription was sent to the pharmacy on 11/19/2021.

## 2021-12-02 ENCOUNTER — Other Ambulatory Visit: Payer: Self-pay | Admitting: Emergency Medicine

## 2021-12-02 ENCOUNTER — Telehealth: Payer: Self-pay | Admitting: Emergency Medicine

## 2021-12-02 DIAGNOSIS — J22 Unspecified acute lower respiratory infection: Secondary | ICD-10-CM

## 2021-12-02 MED ORDER — DOXYCYCLINE HYCLATE 100 MG PO TABS: 100.0000 mg | ORAL_TABLET | Freq: Two times a day (BID) | ORAL | 0 refills | Status: AC

## 2021-12-02 NOTE — Telephone Encounter (Signed)
Prescription for doxycycline sent to pharmacy of record.  Thanks.

## 2021-12-02 NOTE — Telephone Encounter (Signed)
Called and spoke with pt about Dr. Recommendations.

## 2021-12-02 NOTE — Telephone Encounter (Signed)
Pt states she saw provider on 1-30 for respiratory infection and cough, pt states the medication prescribed at visit did not help  Pt states she still has persistent cough, chest congestion, and wheezing  Pt states she took medication as prescribed. Pt requesting an alternative medication for symptoms  Please advise

## 2022-01-06 ENCOUNTER — Encounter: Payer: Self-pay | Admitting: Emergency Medicine

## 2022-01-06 ENCOUNTER — Ambulatory Visit (INDEPENDENT_AMBULATORY_CARE_PROVIDER_SITE_OTHER): Payer: Medicare HMO | Admitting: Emergency Medicine

## 2022-01-06 ENCOUNTER — Other Ambulatory Visit: Payer: Self-pay

## 2022-01-06 VITALS — BP 122/82 | HR 88 | Temp 98.2°F | Ht 65.0 in | Wt 196.0 lb

## 2022-01-06 DIAGNOSIS — E785 Hyperlipidemia, unspecified: Secondary | ICD-10-CM | POA: Diagnosis not present

## 2022-01-06 DIAGNOSIS — F17200 Nicotine dependence, unspecified, uncomplicated: Secondary | ICD-10-CM

## 2022-01-06 DIAGNOSIS — I10 Essential (primary) hypertension: Secondary | ICD-10-CM | POA: Diagnosis not present

## 2022-01-06 DIAGNOSIS — G9331 Postviral fatigue syndrome: Secondary | ICD-10-CM | POA: Diagnosis not present

## 2022-01-06 DIAGNOSIS — N1831 Chronic kidney disease, stage 3a: Secondary | ICD-10-CM | POA: Diagnosis not present

## 2022-01-06 DIAGNOSIS — E039 Hypothyroidism, unspecified: Secondary | ICD-10-CM

## 2022-01-06 DIAGNOSIS — R69 Illness, unspecified: Secondary | ICD-10-CM | POA: Diagnosis not present

## 2022-01-06 DIAGNOSIS — M159 Polyosteoarthritis, unspecified: Secondary | ICD-10-CM | POA: Diagnosis not present

## 2022-01-06 LAB — CBC WITH DIFFERENTIAL/PLATELET
Basophils Absolute: 0.1 10*3/uL (ref 0.0–0.1)
Basophils Relative: 1.9 % (ref 0.0–3.0)
Eosinophils Absolute: 0.6 10*3/uL (ref 0.0–0.7)
Eosinophils Relative: 7.8 % — ABNORMAL HIGH (ref 0.0–5.0)
HCT: 39.6 % (ref 36.0–46.0)
Hemoglobin: 13.1 g/dL (ref 12.0–15.0)
Lymphocytes Relative: 36.5 % (ref 12.0–46.0)
Lymphs Abs: 2.7 10*3/uL (ref 0.7–4.0)
MCHC: 33.1 g/dL (ref 30.0–36.0)
MCV: 81.6 fl (ref 78.0–100.0)
Monocytes Absolute: 0.5 10*3/uL (ref 0.1–1.0)
Monocytes Relative: 6.7 % (ref 3.0–12.0)
Neutro Abs: 3.5 10*3/uL (ref 1.4–7.7)
Neutrophils Relative %: 47.1 % (ref 43.0–77.0)
Platelets: 258 10*3/uL (ref 150.0–400.0)
RBC: 4.85 Mil/uL (ref 3.87–5.11)
RDW: 14.3 % (ref 11.5–15.5)
WBC: 7.4 10*3/uL (ref 4.0–10.5)

## 2022-01-06 LAB — COMPREHENSIVE METABOLIC PANEL
ALT: 14 U/L (ref 0–35)
AST: 22 U/L (ref 0–37)
Albumin: 4.4 g/dL (ref 3.5–5.2)
Alkaline Phosphatase: 43 U/L (ref 39–117)
BUN: 17 mg/dL (ref 6–23)
CO2: 25 mEq/L (ref 19–32)
Calcium: 10.1 mg/dL (ref 8.4–10.5)
Chloride: 104 mEq/L (ref 96–112)
Creatinine, Ser: 1.15 mg/dL (ref 0.40–1.20)
GFR: 49.98 mL/min — ABNORMAL LOW (ref >60.00)
Glucose, Bld: 94 mg/dL (ref 70–99)
Potassium: 3.9 mEq/L (ref 3.5–5.1)
Sodium: 138 mEq/L (ref 135–145)
Total Bilirubin: 0.5 mg/dL (ref 0.2–1.2)
Total Protein: 7.6 g/dL (ref 6.0–8.3)

## 2022-01-06 LAB — LIPID PANEL
Cholesterol: 210 mg/dL — ABNORMAL HIGH (ref 0–200)
HDL: 39.9 mg/dL (ref >39.00)
LDL Cholesterol: 132 mg/dL — ABNORMAL HIGH (ref 0–99)
NonHDL: 170.58
Total CHOL/HDL Ratio: 5
Triglycerides: 191 mg/dL — ABNORMAL HIGH (ref 0.0–149.0)
VLDL: 38.2 mg/dL (ref 0.0–40.0)

## 2022-01-06 LAB — TSH: TSH: 5.8 u[IU]/mL — ABNORMAL HIGH (ref 0.35–5.50)

## 2022-01-06 MED ORDER — LISINOPRIL-HYDROCHLOROTHIAZIDE 20-12.5 MG PO TABS: 1.0000 | ORAL_TABLET | Freq: Every day | ORAL | 3 refills | Status: DC

## 2022-01-06 MED ORDER — LEVOTHYROXINE SODIUM 100 MCG PO TABS: 100.0000 ug | ORAL_TABLET | Freq: Every day | ORAL | 3 refills | Status: DC

## 2022-01-06 MED ORDER — ROSUVASTATIN CALCIUM 20 MG PO TABS: 20.0000 mg | ORAL_TABLET | Freq: Every day | ORAL | 1 refills | Status: DC

## 2022-01-06 NOTE — Assessment & Plan Note (Signed)
Well-controlled hypertension. ?BP Readings from Last 3 Encounters:  ?01/06/22 122/82  ?11/24/21 132/78  ?11/05/21 138/80  ?Continue Zestoretic 20-12.5 mg daily. ?Dietary approaches to stop hypertension discussed. ? ?

## 2022-01-06 NOTE — Assessment & Plan Note (Signed)
Clinically euthyroid.  TSH done today. Continue levothyroxine 100 mcg daily. 

## 2022-01-06 NOTE — Progress Notes (Signed)
Erika Cervantes 66 y.o.   Chief Complaint  Patient presents with   Hypertension    F/u, pt states she is having congestion and feeling fatigue.   Medication Refill    Levothyroxine, lisinopril, and rosuvastatin    HISTORY OF PRESENT ILLNESS: This is a 66 y.o. female complaining of congestion and feeling fatigue since coming down with respiratory infection last June.  Seen by me on 11/24/2021 with a lower respiratory infection. Still having some congestion and fatigue but overall slowly feeling better. History of hypertension with normal blood pressure readings at home.  Needs medication refill. No other complaints or medical concerns today.  Hypertension Pertinent negatives include no chest pain, headaches, palpitations or shortness of breath.  Medication Refill Pertinent negatives include no abdominal pain, chest pain, chills, congestion, coughing, fever, headaches, nausea, rash, sore throat or vomiting.    Prior to Admission medications   Medication Sig Start Date End Date Taking? Authorizing Provider  levothyroxine (SYNTHROID) 100 MCG tablet Take 1 tablet (100 mcg total) by mouth daily with breakfast. 07/08/21 01/06/22 Yes Keyani Rigdon, Eilleen Kempf, MD  lisinopril-hydrochlorothiazide (ZESTORETIC) 20-12.5 MG tablet Take 1 tablet by mouth daily. 07/08/21 01/06/22 Yes Francesca Strome, Eilleen Kempf, MD  omeprazole (PRILOSEC) 20 MG capsule TAKE ONE CAPSULE BY MOUTH DAILY 04/16/21  Yes Sherica Paternostro, Eilleen Kempf, MD  rosuvastatin (CRESTOR) 20 MG tablet Take 1 tablet (20 mg total) by mouth daily. 10/24/20  Yes Achillies Buehl, Eilleen Kempf, MD  acetaminophen (TYLENOL) 650 MG CR tablet Take 650 mg by mouth every 8 (eight) hours as needed for pain.    [provider]  albuterol (PROVENTIL HFA;VENTOLIN HFA) 108 (90 Base) MCG/ACT inhaler Inhale 2 puffs into the lungs every 6 (six) hours as needed. 12/07/17   Wallis Bamberg, PA-C  colchicine 0.6 MG tablet TAKE ONE TABLET BY MOUTH DAILY 11/19/21   Rice, Jamesetta Orleans, MD   dextromethorphan-guaiFENesin [facility] DM) 30-600 MG 12hr tablet Take 1 tablet by mouth 2 (two) times daily. 11/24/21   Georgina Quint, MD    Allergies  Allergen Reactions   Darvocet [Propoxyphene N-Acetaminophen] Nausea And Vomiting    Patient Active Problem List   Diagnosis Date Noted   Bilateral hand pain 08/28/2021   Positive ANA (antinuclear antibody) 08/28/2021   Inflammatory arthritis 08/28/2021   Primary osteoarthritis involving multiple joints 07/08/2021   Dyslipidemia 08/01/2019   Degenerative arthritis of thumb 09/04/2015   Vitamin D deficiency 05/26/2012   Hypothyroidism 02/26/2012   Hyperlipidemia with target LDL less than 100 02/26/2012   HTN (hypertension) 02/26/2012   Obesity (BMI 30.0-34.9) 02/26/2012   Bulging lumbar disc 02/26/2012   GERD (gastroesophageal reflux disease) 02/26/2012    Past Medical History:  Diagnosis Date   Allergy    GERD (gastroesophageal reflux disease)    Hypertension    PONV (postoperative nausea and vomiting)    Thyroid disease     Past Surgical History:  Procedure Laterality Date   ABDOMINAL HYSTERECTOMY  late 1980s   Fibroids were indication.  pt says also had SPO bil.     APPENDECTOMY  late 1980s   done at time of hysto.     BACK SURGERY     BREAST SURGERY     Right breast mass   ESOPHAGOGASTRODUODENOSCOPY (EGD) WITH PROPOFOL N/A 11/08/2018   Procedure: ESOPHAGOGASTRODUODENOSCOPY (EGD) WITH PROPOFOL;  Surgeon: Rachael Fee, MD;  Location: [facility] ENDOSCOPY;  Service: Endoscopy;  Laterality: N/A;   Hammer toes     TONSILLECTOMY      Social History  Socioeconomic History   Marital status: Married    Spouse name: Not on file   Number of children: Not on file   Years of education: Not on file   Highest education level: Not on file  Occupational History   Not on file  Tobacco Use   Smoking status: Every Day    Packs/day: 0.10    Years: 22.00    Pack years: 2.20    Types: Cigarettes   Smokeless tobacco:  Never   Tobacco comments:    3 cigs daiy  Vaping Use   Vaping Use: Never used  Substance and Sexual Activity   Alcohol use: No   Drug use: No   Sexual activity: Not on file  Other Topics Concern   Not on file  Social History Narrative   Not on file   Social Determinants of Health   Financial Resource Strain: Not on file  Food Insecurity: No Food Insecurity   Worried About Running Out of Food in the Last Year: Never true   Ran Out of Food in the Last Year: Never true  Transportation Needs: No Transportation Needs   Lack of Transportation (Medical): No   Lack of Transportation (Non-Medical): No  Physical Activity: Not on file  Stress: Not on file  Social Connections: Not on file  Intimate Partner Violence: Not on file    Family History  Problem Relation Age of Onset   Heart disease Mother    Rheum arthritis Mother      Review of Systems  Constitutional: Negative.  Negative for chills and fever.  HENT: Negative.  Negative for congestion and sore throat.   Respiratory: Negative.  Negative for cough and shortness of breath.   Cardiovascular: Negative.  Negative for chest pain and palpitations.  Gastrointestinal:  Negative for abdominal pain, diarrhea, nausea and vomiting.  Genitourinary: Negative.  Negative for dysuria and hematuria.  Skin: Negative.  Negative for rash.  Neurological: Negative.  Negative for dizziness and headaches.  All other systems reviewed and are negative.  Today's Vitals   01/06/22 0955  BP: 122/82  Pulse: 88  Temp: 98.2 F (36.8 C)  TempSrc: Oral  SpO2: 99%  Weight: 196 lb (88.9 kg)  Height: 5\' 5"  (1.651 m)   Body mass index is 32.62 kg/m.  Physical Exam Vitals reviewed.  Constitutional:      Appearance: Normal appearance.  HENT:     Head: Normocephalic.     Mouth/Throat:     Mouth: Mucous membranes are moist.     Pharynx: Oropharynx is clear.  Eyes:     Extraocular Movements: Extraocular movements intact.      Conjunctiva/sclera: Conjunctivae normal.     Pupils: Pupils are equal, round, and reactive to light.  Cardiovascular:     Rate and Rhythm: Normal rate and regular rhythm.     Pulses: Normal pulses.     Heart sounds: Normal heart sounds.  Pulmonary:     Effort: Pulmonary effort is normal.     Breath sounds: Normal breath sounds.  Abdominal:     Palpations: Abdomen is soft.     Tenderness: There is no abdominal tenderness.  Musculoskeletal:        General: Normal range of motion.     Cervical back: Neck supple. No tenderness.  Lymphadenopathy:     Cervical: No cervical adenopathy.  Skin:    General: Skin is warm and dry.     Capillary Refill: Capillary refill takes less than 2 seconds.  Neurological:  General: No focal deficit present.     Mental Status: She is alert and oriented to person, place, and time.  Psychiatric:        Mood and Affect: Mood normal.        Behavior: Behavior normal.     ASSESSMENT & PLAN: Problem List Items Addressed This Visit       Cardiovascular and Mediastinum   Essential hypertension - Primary    Well-controlled hypertension. BP Readings from Last 3 Encounters:  01/06/22 122/82  11/24/21 132/78  11/05/21 138/80  Continue Zestoretic 20-12.5 mg daily. Dietary approaches to stop hypertension discussed.       Relevant Medications   rosuvastatin (CRESTOR) 20 MG tablet   lisinopril-hydrochlorothiazide (ZESTORETIC) 20-12.5 MG tablet   Other Relevant Orders   Comprehensive metabolic panel     Endocrine   Hypothyroidism    Clinically euthyroid.  TSH done today.  Continue levothyroxine 100 mcg daily.      Relevant Medications   levothyroxine (SYNTHROID) 100 MCG tablet   Other Relevant Orders   TSH     Musculoskeletal and Integument   Primary osteoarthritis involving multiple joints    Stable.  Sees rheumatologist on a regular basis. Was started on colchicine 0.6 mg daily.  May take Tylenol as needed.        Other   Dyslipidemia     Stable.  Diet and nutrition discussed.  Continue rosuvastatin 20 mg daily. Lipid profile done today. The 10-year ASCVD risk score (Arnett DK, et al., 2019) is: 17.3%   Values used to calculate the score:     Age: 35 years     Sex: Female     Is Non-Hispanic African American: Yes     Diabetic: No     Tobacco smoker: Yes     Systolic Blood Pressure: 122 mmHg     Is BP treated: Yes     HDL Cholesterol: 41.7 mg/dL     Total Cholesterol: 209 mg/dL       Relevant Medications   rosuvastatin (CRESTOR) 20 MG tablet   Other Relevant Orders   Lipid panel   Post viral syndrome    Residual congestion and fatigue secondary to recent lower respiratory infection, most likely a viral syndrome.  Slowly getting better.  No complications.      Relevant Orders   CBC with Differential/Platelet   Current smoker    Cardiovascular and cancer risks associated with smoking discussed.  Smoking cessation advice given.       Patient Instructions  Hypertension, Adult High blood pressure (hypertension) is when the force of blood pumping through the arteries is too strong. The arteries are the blood vessels that carry blood from the heart throughout the body. Hypertension forces the heart to work harder to pump blood and may cause arteries to become narrow or stiff. Untreated or uncontrolled hypertension can cause a heart attack, heart failure, a stroke, kidney disease, and other problems. A blood pressure reading consists of a higher number over a lower number. Ideally, your blood pressure should be below 120/80. The first ("top") number is called the systolic pressure. It is a measure of the pressure in your arteries as your heart beats. The second ("bottom") number is called the diastolic pressure. It is a measure of the pressure in your arteries as the heart relaxes. What are the causes? The exact cause of this condition is not known. There are some conditions that result in or are related to high blood  pressure.  What increases the risk? Some risk factors for high blood pressure are under your control. The following factors may make you more likely to develop this condition: Smoking. Having type 2 diabetes mellitus, high cholesterol, or both. Not getting enough exercise or physical activity. Being overweight. Having too much fat, sugar, calories, or salt (sodium) in your diet. Drinking too much alcohol. Some risk factors for high blood pressure may be difficult or impossible to change. Some of these factors include: Having chronic kidney disease. Having a family history of high blood pressure. Age. Risk increases with age. Race. You may be at higher risk if you are African American. Gender. Men are at higher risk than women before age 24. After age 30, women are at higher risk than men. Having obstructive sleep apnea. Stress. What are the signs or symptoms? High blood pressure may not cause symptoms. Very high blood pressure (hypertensive crisis) may cause: Headache. Anxiety. Shortness of breath. Nosebleed. Nausea and vomiting. Vision changes. Severe chest pain. Seizures. How is this diagnosed? This condition is diagnosed by measuring your blood pressure while you are seated, with your arm resting on a flat surface, your legs uncrossed, and your feet flat on the floor. The cuff of the blood pressure monitor will be placed directly against the skin of your upper arm at the level of your heart. It should be measured at least twice using the same arm. Certain conditions can cause a difference in blood pressure between your right and left arms. Certain factors can cause blood pressure readings to be lower or higher than normal for a short period of time: When your blood pressure is higher when you are in a health care provider's office than when you are at home, this is called white coat hypertension. Most people with this condition do not need medicines. When your blood pressure is  higher at home than when you are in a health care provider's office, this is called masked hypertension. Most people with this condition may need medicines to control blood pressure. If you have a high blood pressure reading during one visit or you have normal blood pressure with other risk factors, you may be asked to: Return on a different day to have your blood pressure checked again. Monitor your blood pressure at home for 1 week or longer. If you are diagnosed with hypertension, you may have other blood or imaging tests to help your health care provider understand your overall risk for other conditions. How is this treated? This condition is treated by making healthy lifestyle changes, such as eating healthy foods, exercising more, and reducing your alcohol intake. Your health care provider may prescribe medicine if lifestyle changes are not enough to get your blood pressure under control, and if: Your systolic blood pressure is above 130. Your diastolic blood pressure is above 80. Your personal target blood pressure may vary depending on your medical conditions, your age, and other factors. Follow these instructions at home: Eating and drinking  Eat a diet that is high in fiber and potassium, and low in sodium, added sugar, and fat. An example eating plan is called the DASH (Dietary Approaches to Stop Hypertension) diet. To eat this way: Eat plenty of fresh fruits and vegetables. Try to fill one half of your plate at each meal with fruits and vegetables. Eat whole grains, such as whole-wheat pasta, brown rice, or whole-grain bread. Fill about one fourth of your plate with whole grains. Eat or drink low-fat dairy products, such as  skim milk or low-fat yogurt. Avoid fatty cuts of meat, processed or cured meats, and poultry with skin. Fill about one fourth of your plate with lean proteins, such as fish, chicken without skin, beans, eggs, or tofu. Avoid pre-made and processed foods. These tend to  be higher in sodium, added sugar, and fat. Reduce your daily sodium intake. Most people with hypertension should eat less than 1,500 mg of sodium a day. Do not drink alcohol if: Your health care provider tells you not to drink. You are pregnant, may be pregnant, or are planning to become pregnant. If you drink alcohol: Limit how much you use to: 0-1 drink a day for women. 0-2 drinks a day for men. Be aware of how much alcohol is in your drink. In the [place] one drink equals one 12 oz bottle of beer (355 mL), one 5 oz glass of wine (148 mL), or one 1 oz glass of hard liquor (44 mL). Lifestyle  Work with your health care provider to maintain a healthy body weight or to lose weight. Ask what an ideal weight is for you. Get at least 30 minutes of exercise most days of the week. Activities may include walking, swimming, or biking. Include exercise to strengthen your muscles (resistance exercise), such as Pilates or lifting weights, as part of your weekly exercise routine. Try to do these types of exercises for 30 minutes at least 3 days a week. Do not use any products that contain nicotine or tobacco, such as cigarettes, e-cigarettes, and chewing tobacco. If you need help quitting, ask your health care provider. Monitor your blood pressure at home as told by your health care provider. Keep all follow-up visits as told by your health care provider. This is important. Medicines Take over-the-counter and prescription medicines only as told by your health care provider. Follow directions carefully. Blood pressure medicines must be taken as prescribed. Do not skip doses of blood pressure medicine. Doing this puts you at risk for problems and can make the medicine less effective. Ask your health care provider about side effects or reactions to medicines that you should watch for. Contact a health care provider if you: Think you are having a reaction to a medicine you are taking. Have headaches that keep  coming back (recurring). Feel dizzy. Have swelling in your ankles. Have trouble with your vision. Get help right away if you: Develop a severe headache or confusion. Have unusual weakness or numbness. Feel faint. Have severe pain in your chest or abdomen. Vomit repeatedly. Have trouble breathing. Summary Hypertension is when the force of blood pumping through your arteries is too strong. If this condition is not controlled, it may put you at risk for serious complications. Your personal target blood pressure may vary depending on your medical conditions, your age, and other factors. For most people, a normal blood pressure is less than 120/80. Hypertension is treated with lifestyle changes, medicines, or a combination of both. Lifestyle changes include losing weight, eating a healthy, low-sodium diet, exercising more, and limiting alcohol. This information is not intended to replace advice given to you by your health care provider. Make sure you discuss any questions you have with your health care provider. Document Revised: 06/22/2018 Document Reviewed: 06/22/2018 Elsevier Patient Education  2022 Elsevier Inc.     Edwina Barth, MD [facility]

## 2022-01-06 NOTE — Assessment & Plan Note (Signed)
Residual congestion and fatigue secondary to recent lower respiratory infection, most likely a viral syndrome.  Slowly getting better.  No complications. ?

## 2022-01-06 NOTE — Patient Instructions (Signed)

## 2022-01-06 NOTE — Assessment & Plan Note (Signed)
Cardiovascular and cancer risks associated with smoking discussed. °Smoking cessation advice given. °

## 2022-01-06 NOTE — Assessment & Plan Note (Signed)
Stable.  Sees rheumatologist on a regular basis. ?Was started on colchicine 0.6 mg daily.  May take Tylenol as needed. ?

## 2022-01-06 NOTE — Assessment & Plan Note (Signed)
Stable.  Diet and nutrition discussed.  Continue rosuvastatin 20 mg daily. ?Lipid profile done today. ?The 10-year ASCVD risk score (Arnett DK, et al., 2019) is: 17.3% ?  Values used to calculate the score: ?    Age: 66 years ?    Sex: Female ?    Is Non-Hispanic African American: Yes ?    Diabetic: No ?    Tobacco smoker: Yes ?    Systolic Blood Pressure: 341 mmHg ?    Is BP treated: Yes ?    HDL Cholesterol: 41.7 mg/dL ?    Total Cholesterol: 209 mg/dL ? ?

## 2022-01-09 ENCOUNTER — Telehealth: Payer: Managed Care, Other (non HMO)

## 2022-01-12 NOTE — Progress Notes (Signed)
Restart taking cholesterol medication every other day.  We will repeat TSH level in 4 to 6 weeks.  Thanks.

## 2022-01-15 ENCOUNTER — Other Ambulatory Visit: Payer: Self-pay | Admitting: Emergency Medicine

## 2022-01-15 ENCOUNTER — Ambulatory Visit (INDEPENDENT_AMBULATORY_CARE_PROVIDER_SITE_OTHER): Payer: Medicare HMO | Admitting: *Deleted

## 2022-01-15 DIAGNOSIS — E785 Hyperlipidemia, unspecified: Secondary | ICD-10-CM

## 2022-01-15 DIAGNOSIS — K219 Gastro-esophageal reflux disease without esophagitis: Secondary | ICD-10-CM

## 2022-01-15 DIAGNOSIS — I10 Essential (primary) hypertension: Secondary | ICD-10-CM

## 2022-01-15 NOTE — Chronic Care Management (AMB) (Signed)
?Chronic Care Management  ? ?CCM RN Visit Note ? ?01/15/2022 ?Name: Erika Cervantes MRN: [id number] DOB: 04/20/1956 ? ?Subjective: ?Erika Cervantes is a 66 y.o. year old female who is a primary care patient of Mitchel Honour, Ines Bloomer, MD. The care management team was consulted for assistance with disease management and care coordination needs.   ? ?Engaged with patient by telephone for follow up visit in response to provider referral for case management and/or care coordination services.  ? ?Consent to Services:  ?The patient was given information about Chronic Care Management services, agreed to services, and gave verbal consent prior to initiation of services.  Please see initial visit note for detailed documentation.  ?Patient agreed to services and verbal consent obtained.  ? ?Assessment: Review of patient past medical history, allergies, medications, health status, including review of consultants reports, laboratory and other test data, was performed as part of comprehensive evaluation and provision of chronic care management services.  ? ?SDOH (Social Determinants of Health) assessments and interventions performed:  ?SDOH Interventions   ? ?Flowsheet Row Most Recent Value  ?SDOH Interventions   ?Food Insecurity Interventions Intervention Not Indicated  [Continues to deny food insecurity]  ?Housing Interventions Intervention Not Indicated  ?Transportation Interventions Intervention Not Indicated  [Continues to drive self]  ? ?  ? ?CCM Care Plan ? ?Allergies  ?Allergen Reactions  ? Darvocet [Propoxyphene N-Acetaminophen] Nausea And Vomiting  ? ?Outpatient Encounter Medications as of 01/15/2022  ?Medication Sig  ? acetaminophen (TYLENOL) 650 MG CR tablet Take 650 mg by mouth every 8 (eight) hours as needed for pain.  ? albuterol (PROVENTIL HFA;VENTOLIN HFA) 108 (90 Base) MCG/ACT inhaler Inhale 2 puffs into the lungs every 6 (six) hours as needed.  ? colchicine 0.6 MG tablet TAKE ONE TABLET BY MOUTH DAILY  ?  dextromethorphan-guaiFENesin (MUCINEX DM) 30-600 MG 12hr tablet Take 1 tablet by mouth 2 (two) times daily.  ? levothyroxine (SYNTHROID) 100 MCG tablet Take 1 tablet (100 mcg total) by mouth daily with breakfast.  ? lisinopril-hydrochlorothiazide (ZESTORETIC) 20-12.5 MG tablet Take 1 tablet by mouth daily.  ? omeprazole (PRILOSEC) 20 MG capsule TAKE ONE CAPSULE BY MOUTH DAILY  ? rosuvastatin (CRESTOR) 20 MG tablet Take 1 tablet (20 mg total) by mouth daily.  ? ?No facility-administered encounter medications on file as of 01/15/2022.  ? ?Patient Active Problem List  ? Diagnosis Date Noted  ? Stage 3a chronic kidney disease ([place]) 01/08/2022  ? 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 Current smoker 01/06/2022  ? Bilateral hand pain 08/28/2021  ? Positive ANA (antinuclear antibody) 08/28/2021  ? Inflammatory arthritis 08/28/2021  ? Primary osteoarthritis involving multiple joints 07/08/2021  ? Dyslipidemia 08/01/2019  ? Degenerative arthritis of thumb 09/04/2015  ? Vitamin D deficiency 05/26/2012  ? Hypothyroidism 02/26/2012  ? Hyperlipidemia with target LDL less than 100 02/26/2012  ? Essential hypertension 02/26/2012  ? Obesity (BMI 30.0-34.9) 02/26/2012  ? Bulging lumbar disc 02/26/2012  ? GERD (gastroesophageal reflux disease) 02/26/2012  ? ?Conditions to be addressed/monitored:  HTN and HLD ? ?Care Plan : RN Care Manager Plan of Care  ?Updates made by Knox Royalty, RN since 01/15/2022 12:00 AM  ?  ? ?Problem: Chronic Disease Management Needs   ?Priority: Medium  ?  ? ?Long-Range Goal: Ongoing adherence to established plan of care for long term chronic disease management   ?Start Date: 07/25/2021  ?Expected End Date: 07/25/2022  ?Priority: Medium  ?Note:   ?Current Barriers:  ?Chronic Disease Management support and education needs related  to HTN and HLD ? ?RNCM Clinical Goal(s):  ?Patient will demonstrate ongoing health management independence HTN; HLD  through collaboration with RN Care manager, provider, and care team.   ? ?Interventions: ?1:1 collaboration with primary care provider regarding development and update of comprehensive plan of care as evidenced by provider attestation and co-signature ?Inter-disciplinary care team collaboration (see longitudinal plan of care) ?Evaluation of current treatment plan related to  self management and patient's adherence to plan as established by provider ?01/15/22: ?Review of patient status, including review of consultants reports, relevant laboratory and other test results, and medications completed ?SDOH updated: no new/ unmet concerns identified ?Depression screening updated: no concerns identified ?Pain assessment updated: denies pain today; reports ongoing intermittent chronic pain of bilateral hands, due to osteoarthritis: continues followed regularly by rheumatologist; reports pain is manageable ?Falls assessment updated: denies new/ recent falls x 12 months- does not use assistive devices;  positive reinforcement provided with encouragement to continue efforts at fall prevention; previously provided education around fall risks/ prevention reinforced ?Medications discussed: reports continues to independently self-manage and denies current concerns/ issues/ questions around medications; endorses adherence to taking all medications as prescribed ?Confirms she is taking newly/ recently prescribed rosuvastatin every other day as per PCP instructions 01/06/22: denies concerns with side effects  ?Reviewed upcoming scheduled provider appointments: 02/10/22- rheumatology provider; 07/07/22- PCP; patient confirms is aware of all and has plans to attend as scheduled ?Discussed plans with patient for ongoing care management follow up and provided patient with direct contact information for care management team    ? ?Hyperlipidemia:  (Status: 01/15/22: Goal on Track (progressing): YES.) Long Term Goal ?Lab Results  ?Component Value Date  ? CHOL 210 (H) 01/06/2022  ? HDL 39.90 01/06/2022  ? LDLCALC  132 (H) 01/06/2022  ? LDLDIRECT 181 (H) 03/12/2014  ? TRIG 191.0 (H) 01/06/2022  ? CHOLHDL 5 01/06/2022  ?  ?Counseled on importance of regular laboratory monitoring as prescribed; ?Reviewed role and benefits of statin for ASCVD risk reduction; ?Reviewed importance of limiting foods high in cholesterol; ?Reviewed exercise goals and target of 150 minutes per week; ?Briefly reviewed updated/ most recent lab values from PCP office visit  ? ?Hypertension: (Status: 01/15/22: Goal on Track (progressing): YES.) Long Term Goal ?Last practice recorded BP readings:  ?BP Readings from Last 3 Encounters:  ?01/06/22 122/82  ?11/24/21 132/78  ?11/05/21 138/80  ?Most recent eGFR/CrCl: No results found for: EGFR  No components found for: CRCL ? ?Evaluation of current treatment plan related to hypertension self management and patient's adherence to plan as established by provider;   ?Reviewed prescribed diet low salt, heart healthy, low cholesterol ?Counseled on the importance of exercise goals with target of 150 minutes per week ?Discussed complications of poorly controlled blood pressure such as heart disease, stroke, circulatory complications, vision complications, kidney impairment, sexual dysfunction;  ?Reviewed with patient recent PCP office visit 01/06/22: patient verbalizes good understanding of post- office visit instructions, confirms that she is no longer experiencing issues with signs/ symptoms URI, as per earlier in January ?Confirmed patient remains active on a regular basis- walks, works out at [employer], rides stationary bike at home: this was encouraged/ positive reinforcement provided ?Confirmed patient monitors blood pressures at home weekly: we discussed general target blood pressure- she reports all blood pressures at home "all good and in target," if she is at rest- she is unable to review blood pressures from home monitoring today- she is not at home ? ?Patient Goals/Self-Care Activities: ?As evidenced by  review of  EHR, collaboration with care team, and patient reporting during CCM RN CM outreach,  ?Patient [place] will: ?Take medications as prescribed   ?Attend all scheduled provider appointments  ?Call pharmacy for medication refills  ?

## 2022-01-15 NOTE — Patient Instructions (Addendum)
Visit Information ? ?[place], thank you for taking time to talk with me today. Please don't hesitate to contact me if I can be of assistance to you before our next scheduled telephone appointment ? ?Below are the goals we discussed today:  ?Patient Self-Care Activities: ?Patient Erika Cervantes will: ?Take medications as prescribed   ?Attend all scheduled provider appointments  ?Call pharmacy for medication refills  ?Call provider office for new concerns or questions  ?Continue to follow heart healthy, low salt, low cholesterol diet ?Remain active and get regular weekly exercise ?Continue to monitor blood pressures at home weekly ?Keep up the great work preventing falls ? ?Our next scheduled telephone follow up visit/ appointment is scheduled on: Monday, July 20, 2022 at 3:00 pm- This is a PHONE CALL appointment ? ?If you need to cancel or re-schedule our visit, please call [phone] and our care guide team will be happy to assist you. ?  ?I look forward to hearing about your progress. ?  ?Oneta Rack, RN, BSN, CCRN Alumnus ?[place] ?([phone]: direct office ? ?If you are experiencing a Mental Health or [place] or need someone to talk to, please  ?call the Suicide and Crisis Lifeline: 988 ?call the [place] National Suicide Prevention Lifeline: 2564927960 or TTY: 469-193-1365 TTY 662-038-5109) to talk to a trained counselor ?call 1-800-273-TALK (toll free, 24 hour hotline) ?go to [facility] [address], [place] [phone]) ?call 911  ? ?The patient verbalized understanding of instructions, educational materials, and care plan provided today and agreed to receive a mailed copy of patient instructions, educational materials, and care plan  ? ?Exercising to Stay Healthy ?To become healthy and stay healthy, it is recommended that you do moderate-intensity and vigorous-intensity exercise. You can tell that  you are exercising at a moderate intensity if your heart starts beating faster and you start breathing faster but can still hold a conversation. You can tell that you are exercising at a vigorous intensity if you are breathing much harder and faster and cannot hold a conversation while exercising. ?How can exercise benefit me? ?Exercising regularly is important. It has many health benefits, such as: ?Improving overall fitness, flexibility, and endurance. ?Increasing bone density. ?Helping with weight control. ?Decreasing body fat. ?Increasing muscle strength and endurance. ?Reducing stress and tension, anxiety, depression, or anger. ?Improving overall health. ?What guidelines should I follow while exercising? ?Before you start a new exercise program, talk with your health care provider. ?Do not exercise so much that you hurt yourself, feel dizzy, or get very short of breath. ?Wear comfortable clothes and wear shoes with good support. ?Drink plenty of water while you exercise to prevent dehydration or heat stroke. ?Work out until your breathing and your heartbeat get faster (moderate intensity). ?How often should I exercise? ?Choose an activity that you enjoy, and set realistic goals. Your health care provider can help you make an activity plan that is individually designed and works best for you. ?Exercise regularly as told by your health care provider. This may include: ?Doing strength training two times a week, such as: ?Lifting weights. ?Using resistance bands. ?Push-ups. ?Sit-ups. ?Yoga. ?Doing a certain intensity of exercise for a given amount of time. Choose from these options: ?A total of 150 minutes of moderate-intensity exercise every week. ?A total of 75 minutes of vigorous-intensity exercise every week. ?A mix of moderate-intensity and vigorous-intensity exercise every week. ?Children, pregnant women, people who have not exercised regularly,  people who are overweight, and older adults may need to talk  with a health care provider about what activities are safe to perform. If you have a medical condition, be sure to talk with your health care provider before you start a new exercise program. ?What are some exercise ideas? ?Moderate-intensity exercise ideas include: ?Walking 1 mile (1.6 km) in about 15 minutes. ?Biking. ?Hiking. ?Golfing. ?Dancing. ?Water aerobics. ?Vigorous-intensity exercise ideas include: ?Walking 4.5 miles (7.2 km) or more in about 1 hour. ?Jogging or running 5 miles (8 km) in about 1 hour. ?Biking 10 miles (16.1 km) or more in about 1 hour. ?Lap swimming. ?Roller-skating or in-line skating. ?Cross-country skiing. ?Vigorous competitive sports, such as football, basketball, and soccer. ?Jumping rope. ?Aerobic dancing. ?What are some everyday activities that can help me get exercise? ?[place] work, such as: ?Pushing a Conservation officer, nature. ?Raking and bagging leaves. ?Washing your car. ?Pushing a stroller. ?Shoveling snow. ?Gardening. ?Washing windows or floors. ?How can I be more active in my day-to-day activities? ?Use stairs instead of an elevator. ?Take a walk during your lunch break. ?If you drive, park your car farther away from your work or school. ?If you take public transportation, get off one stop early and walk the rest of the way. ?Stand up or walk around during all of your indoor phone calls. ?Get up, stretch, and walk around every 30 minutes throughout the day. ?Enjoy exercise with a friend. Support to continue exercising will help you keep a regular routine of activity. ?Where to find more information ?You can find more information about exercising to stay healthy from: ?U.S. Department of Health and Human Services: Bonded[employer].at ?Centers for Disease Control and Prevention (CDC): http://www.wolf.info/ ?Summary ?Exercising regularly is important. It will improve your overall fitness, flexibility, and endurance. ?Regular exercise will also improve your overall health. It can help you control your weight,  reduce stress, and improve your bone density. ?Do not exercise so much that you hurt yourself, feel dizzy, or get very short of breath. ?Before you start a new exercise program, talk with your health care provider. ?This information is not intended to replace advice given to you by your health care provider. Make sure you discuss any questions you have with your health care provider. ?Document Revised: 02/07/2021 Document Reviewed: 02/07/2021 ?Elsevier Patient Education ? [place]. ? ?

## 2022-01-23 DIAGNOSIS — I10 Essential (primary) hypertension: Secondary | ICD-10-CM | POA: Diagnosis not present

## 2022-01-23 DIAGNOSIS — E785 Hyperlipidemia, unspecified: Secondary | ICD-10-CM | POA: Diagnosis not present

## 2022-02-10 ENCOUNTER — Ambulatory Visit: Payer: Medicare HMO | Admitting: Internal Medicine

## 2022-02-10 ENCOUNTER — Encounter: Payer: Self-pay | Admitting: Internal Medicine

## 2022-02-10 VITALS — BP 115/72 | HR 88 | Resp 12 | Ht 66.0 in | Wt 194.8 lb

## 2022-02-10 DIAGNOSIS — R768 Other specified abnormal immunological findings in serum: Secondary | ICD-10-CM | POA: Diagnosis not present

## 2022-02-10 DIAGNOSIS — M1990 Unspecified osteoarthritis, unspecified site: Secondary | ICD-10-CM

## 2022-02-10 DIAGNOSIS — M79641 Pain in right hand: Secondary | ICD-10-CM

## 2022-02-10 DIAGNOSIS — M79642 Pain in left hand: Secondary | ICD-10-CM | POA: Diagnosis not present

## 2022-02-10 MED ORDER — COLCHICINE 0.6 MG PO TABS: 0.6000 mg | ORAL_TABLET | Freq: Every day | ORAL | 1 refills | Status: DC | PRN

## 2022-02-10 NOTE — Progress Notes (Signed)
? ?Office Visit Note ? ?Patient: Erika Cervantes             ?Date of Birth: Mar 20, 1956           ?MRN: [id number]             ?PCP: Horald Pollen, MD ?Referring: Horald Pollen, * ?Visit Date: 02/10/2022 ? ? ?Subjective:  ?Pain of the Right Hand (Aching and stiffness, denies swelling), Pain of the Left Hand (Aching and stiffness, denies swelling), and Pain of the Right Ankle (Aching and stiffness, denies swelling, no injury) ? ? ?History of Present Illness: Erika Cervantes is a 66 y.o. female here for follow up for bilateral hand pain and swelling with hand OA and periarticular calcifications on colchicine. She is taking the medication once daily and when she has an exacerbation of symptoms increases up to twice daily. This is controlled her swelling very well and aching mostly but still feels very stiff. She has the best relief when her hands are in warm water and after using and stretching them for a while improves. More recently has some catching or locking problem in her right elbow getting stuck in position when flexed. This is painful more on the ulnar side. She also has some pain at the right ankle anterior and lateral side worst with aching and some pins and needles sensation positionally. ? ?Previous HPIsus ?11/05/21 ?Erika Cervantes is a 66 y.o. female here for follow up for hand pain and swelling trying treatment with colchicine. She had initial response with medrol taper and then felt partially better on colchicine. She tried increasing colchicine to 0.6 mg twice daily due to ongoing symptoms and good improvement in hand pain and swelling. She continues to have a lot of stiffness both in her hands and all over, worst first thing in the mornings. She notices some catching or locking movement in the right middle finger and now has more symptoms on the right instead of the left. ?  ?Previous HPI ?08/19/21 ?[name] is a 66 y.o. female here for arthritis of bilateral hands with increase pain and swelling  particularly at PIP and DIP joints.  She has had chronic osteoarthritis symptoms in multiple sites particularly in the lumbar spine and the left thumb in the past also some knee stiffness and crepitus.  But for the last about 5 months has increased hand pain morning stiffness and intermittent swelling.  Symptoms have forced her to stop wearing her rings most of the time due to swelling.  Worst affected is her left ring finger with swelling and hyperpigmentation at the PIP joint.  She weeks ago with some morning numbness has previously had carpal tunnel syndrome that improved with conservative management including wrist braces.  Outside of the hand pain she has had some increase in pain with the right shoulder particularly lying on her side in bed and increased clicking or popping of her knee use with sitting standing and when walking. She has taken NSAIDs for this problem but limited use recommended due to mild renal function impairment and due to GERD also has positive Cologuard study. Lab studies in PCP office last month showing low positive ANA and elevated sedimentation rate, negative rheumatoid factor and CCP. ? ? ?Review of Systems  ?Constitutional:  Positive for fatigue.  ?HENT:  Negative for mouth dryness.   ?Eyes:  Positive for dryness.  ?Respiratory:  Positive for shortness of breath.   ?Cardiovascular:  Positive for swelling in legs/feet.  ?Gastrointestinal:  Negative  for constipation.  ?Endocrine: Negative for excessive thirst.  ?Genitourinary:  Negative for difficulty urinating.  ?Musculoskeletal:  Positive for joint pain, joint pain, joint swelling, muscle weakness, morning stiffness and muscle tenderness.  ?Skin:  Negative for rash.  ?Allergic/Immunologic: Negative for susceptible to infections.  ?Neurological:  Positive for numbness and weakness.  ?Hematological:  Negative for bruising/bleeding tendency.  ?Psychiatric/Behavioral:  Positive for sleep disturbance.   ? ?PMFS History:  ?Patient Active  Problem List  ? Diagnosis Date Noted  ? Stage 3a chronic kidney disease ([place]) 01/08/2022  ? 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 Current smoker 01/06/2022  ? Bilateral hand pain 08/28/2021  ? Positive ANA (antinuclear antibody) 08/28/2021  ? Inflammatory arthritis 08/28/2021  ? Primary osteoarthritis involving multiple joints 07/08/2021  ? Dyslipidemia 08/01/2019  ? Degenerative arthritis of thumb 09/04/2015  ? Vitamin D deficiency 05/26/2012  ? Hypothyroidism 02/26/2012  ? Hyperlipidemia with target LDL less than 100 02/26/2012  ? Essential hypertension 02/26/2012  ? Obesity (BMI 30.0-34.9) 02/26/2012  ? Bulging lumbar disc 02/26/2012  ? GERD (gastroesophageal reflux disease) 02/26/2012  ?  ?Past Medical History:  ?Diagnosis Date  ? Allergy   ? GERD (gastroesophageal reflux disease)   ? High cholesterol   ? Hypertension   ? PONV (postoperative nausea and vomiting)   ? Thyroid disease   ?  ?Family History  ?Problem Relation Age of Onset  ? Heart disease Mother   ? Rheum arthritis Mother   ? ?Past Surgical History:  ?Procedure Laterality Date  ? ABDOMINAL HYSTERECTOMY  late 1980s  ? Fibroids were indication.  pt says also had SPO bil.    ? APPENDECTOMY  late 1980s  ? done at time of hysto.    ? BACK SURGERY    ? BREAST SURGERY    ? Right breast mass  ? ESOPHAGOGASTRODUODENOSCOPY (EGD) WITH PROPOFOL N/A 11/08/2018  ? Procedure: ESOPHAGOGASTRODUODENOSCOPY (EGD) WITH PROPOFOL;  Surgeon: Milus Banister, MD;  Location: [facility] ENDOSCOPY;  Service: Endoscopy;  Laterality: N/A;  ? Hammer toes    ? TONSILLECTOMY    ? ?Social History  ? ?Social History Narrative  ? Not on file  ? ?Immunization History  ?Administered Date(s) Administered  ? Fluad Quad(high Dose 65+) 07/08/2021  ? Influenza Split 08/28/2011  ? Influenza,inj,Quad PF,6+ Mos 10/06/2013, 07/10/2015, 07/15/2016, 10/24/2018, 08/01/2019  ? PFIZER(Purple Top)SARS-COV-2 Vaccination 01/09/2020, 01/29/2020, 09/03/2020  ? Pneumococcal Polysaccharide-23 08/01/2019  ? Td  10/26/2002  ? Tdap 05/26/2012  ?  ? ?Objective: ?Vital Signs: BP 115/72 (BP Location: Left Arm, Patient Position: Sitting, Cuff Size: Large)   Pulse 88   Resp 12   Ht '5\' 6"'$  (1.676 m)   Wt 194 lb 12.8 oz (88.4 kg)   BMI 31.44 kg/m?   ? ?Physical Exam ?Eyes:  ?   Conjunctiva/sclera: Conjunctivae normal.  ?Cardiovascular:  ?   Rate and Rhythm: Normal rate and regular rhythm.  ?Pulmonary:  ?   Effort: Pulmonary effort is normal.  ?   Breath sounds: Normal breath sounds.  ?Musculoskeletal:  ?   Right lower leg: No edema.  ?   Left lower leg: No edema.  ?Skin: ?   General: Skin is warm and dry.  ?   Findings: No rash.  ?Neurological:  ?   General: No focal deficit present.  ?   Mental Status: She is alert.  ?Psychiatric:     ?   Mood and Affect: Mood normal.  ?  ? ?Musculoskeletal Exam:  ?Shoulders full ROM no tenderness or  swelling ?Elbows full ROM, right elbow mild tenderness to pressure around medial epicondyle extending partway to forearm ?Wrists full ROM no tenderness or swelling ?Fingers decreased flexion ROM bilaterally worse on left, left 4th PIP enlarged and with hyperpigmentation, heberdon's nodes on several fingers both hands ?Ankles full ROM no tenderness or swelling, right ankle mild tenderness anteriorly ? ?Investigation: ?No additional findings. ? ?Imaging: ?No results found. ? ?Recent Labs: ?Lab Results  ?Component Value Date  ? WBC 7.4 01/06/2022  ? HGB 13.1 01/06/2022  ? PLT 258.0 01/06/2022  ? NA 138 01/06/2022  ? K 3.9 01/06/2022  ? CL 104 01/06/2022  ? CO2 25 01/06/2022  ? GLUCOSE 94 01/06/2022  ? BUN 17 01/06/2022  ? CREATININE 1.15 01/06/2022  ? BILITOT 0.5 01/06/2022  ? ALKPHOS 43 01/06/2022  ? AST 22 01/06/2022  ? ALT 14 01/06/2022  ? PROT 7.6 01/06/2022  ? ALBUMIN 4.4 01/06/2022  ? CALCIUM 10.1 01/06/2022  ? GFRAA 61 10/29/2020  ? ? ?Speciality Comments: No specialty comments available. ? ?Procedures:  ?No procedures performed ?Allergies: Darvocet [propoxyphene n-acetaminophen]   ? ?Assessment / Plan:     ?Visit Diagnoses: Inflammatory arthritis  ?Bilateral hand pain - Plan: colchicine 0.6 MG tablet ? ?Joint inflammation appears well controlled no synovitis appreciated on exam.  She does have osteoarthriti

## 2022-05-20 ENCOUNTER — Telehealth: Payer: Self-pay

## 2022-05-20 NOTE — Telephone Encounter (Signed)
Left a message for the pt to call the clinic back to set an AWV or give [place] the date a nurse has came to her home to do one.

## 2022-05-29 ENCOUNTER — Ambulatory Visit: Payer: Medicare HMO | Admitting: *Deleted

## 2022-05-29 DIAGNOSIS — E785 Hyperlipidemia, unspecified: Secondary | ICD-10-CM

## 2022-05-29 DIAGNOSIS — I10 Essential (primary) hypertension: Secondary | ICD-10-CM

## 2022-05-29 NOTE — Chronic Care Management (AMB) (Signed)
Care Management    RN Visit Note  05/29/2022 Name: Erika Cervantes MRN: [id number] DOB: 05-01-1956  Subjective: Erika Cervantes is a 66 y.o. year old female who is a primary care patient of Mitchel Honour, Ines Bloomer, MD. The care management team was consulted for assistance with disease management and care coordination needs.    Engaged with patient by telephone for follow up visit/ RN CM case closure in response to provider referral for case management and/or care coordination services.   Consent to Services:   Erika Cervantes was given information about Care Management services 07/09/21 including:  Care Management services includes personalized support from designated clinical staff supervised by her physician, including individualized plan of care and coordination with other care providers 24/7 contact phone numbers for assistance for urgent and routine care needs. The patient may stop case management services at any time by phone call to the office staff.  Patient agreed to services and consent obtained.   Assessment: Review of patient past medical history, allergies, medications, health status, including review of consultants reports, laboratory and other test data, was performed as part of comprehensive evaluation and provision of chronic care management services.   SDOH (Social Determinants of Health) assessments and interventions performed:  SDOH Interventions    Flowsheet Row Most Recent Value  SDOH Interventions   Food Insecurity Interventions Intervention Not Indicated  Transportation Interventions Intervention Not Indicated  [continues to drive self]     Care Plan  Allergies  Allergen Reactions   Darvocet [Propoxyphene N-Acetaminophen] Nausea And Vomiting   Outpatient Encounter Medications as of 05/29/2022  Medication Sig   acetaminophen (TYLENOL) 650 MG CR tablet Take 650 mg by mouth every 8 (eight) hours as needed for pain. (Patient not taking: Reported on 02/10/2022)   albuterol  (PROVENTIL HFA;VENTOLIN HFA) 108 (90 Base) MCG/ACT inhaler Inhale 2 puffs into the lungs every 6 (six) hours as needed.   colchicine 0.6 MG tablet Take 1 tablet (0.6 mg total) by mouth daily as needed.   dextromethorphan-guaiFENesin (MUCINEX DM) 30-600 MG 12hr tablet Take 1 tablet by mouth 2 (two) times daily. (Patient not taking: Reported on 02/10/2022)   levothyroxine (SYNTHROID) 100 MCG tablet Take 1 tablet (100 mcg total) by mouth daily with breakfast.   lisinopril-hydrochlorothiazide (ZESTORETIC) 20-12.5 MG tablet Take 1 tablet by mouth daily.   omeprazole (PRILOSEC) 20 MG capsule TAKE ONE CAPSULE BY MOUTH DAILY   rosuvastatin (CRESTOR) 20 MG tablet Take 1 tablet (20 mg total) by mouth daily. (Patient taking differently: Take 20 mg by mouth every other day.)   No facility-administered encounter medications on file as of 05/29/2022.   Patient Active Problem List   Diagnosis Date Noted   Stage 3a chronic kidney disease ([place]) 01/08/2022   Post viral syndrome 01/06/2022   Current smoker 01/06/2022   Bilateral hand pain 08/28/2021   Positive ANA (antinuclear antibody) 08/28/2021   Inflammatory arthritis 08/28/2021   Primary osteoarthritis involving multiple joints 07/08/2021   Dyslipidemia 08/01/2019   Degenerative arthritis of thumb 09/04/2015   Vitamin D deficiency 05/26/2012   Hypothyroidism 02/26/2012   Hyperlipidemia with target LDL less than 100 02/26/2012   Essential hypertension 02/26/2012   Obesity (BMI 30.0-34.9) 02/26/2012   Bulging lumbar disc 02/26/2012   GERD (gastroesophageal reflux disease) 02/26/2012   Conditions to be addressed/monitored: HTN and HLD  Care Plan : RN Care Manager Plan of Care  Updates made by Knox Royalty, RN since 05/29/2022 12:00 AM     Problem: Chronic Disease Management  Needs   Priority: Medium     Long-Range Goal: Ongoing adherence to established plan of care for long term chronic disease management   Start Date: 07/25/2021  Expected End  Date: 07/25/2022  Priority: Medium  Note:   Current Barriers:  Chronic Disease Management support and education needs related to HTN and HLD  RNCM Clinical Goal(s):  Patient will demonstrate ongoing health management independence HTN; HLD  through collaboration with RN Care manager, provider, and care team.   Interventions: 1:1 collaboration with primary care provider regarding development and update of comprehensive plan of care as evidenced by provider attestation and co-signature Inter-disciplinary care team collaboration (see longitudinal plan of care) Evaluation of current treatment plan related to  self management and patient's adherence to plan as established by provider Review of patient status, including review of consultants reports, relevant laboratory and other test results, and medications completed SDOH updated: no new/ unmet concerns identified Depression screening updated: no concerns identified Pain assessment updated: denies pain today; reports ongoing intermittent chronic pain of bilateral hands, due to osteoarthritis: continues followed regularly by rheumatologist; reports pain is manageable Reviewed most recent rheumatology provider office visit 02/10/22- reports taking colchicine prn- reports "helping" Falls assessment updated: denies new/ recent falls x 12 months- does not use assistive devices;  positive reinforcement provided with encouragement to continue efforts at fall prevention; previously provided education around fall risks/ prevention reinforced Medications discussed: reports continues to independently self-manage and denies current concerns/ issues/ questions around medications; endorses adherence to taking all medications as prescribed Reviewed upcoming scheduled provider appointments:  07/07/22- PCP; patient confirms is aware of and has plans to attend as scheduled Reminded patient to schedule annual medicare wellness visit- office staff has contacted patient to  schedule Discussed plans with patient for ongoing care management follow up- patient denies current care coordination/ care management needs and is agreeable to CCM RN CM case closure today; verbalizes understanding to contact PCP or other care providers for any needs that arise in the future, and confirms she has contact information for all care providers     Hyperlipidemia:  (Status: 05/29/22: Goal Met.) Long Term Goal Lab Results  Component Value Date   CHOL 210 (H) 01/06/2022   HDL 39.90 01/06/2022   [place] 132 (H) 01/06/2022   LDLDIRECT 181 (H) 03/12/2014   TRIG 191.0 (H) 01/06/2022   CHOLHDL 5 01/06/2022    Counseled on importance of regular laboratory monitoring as prescribed; Reviewed importance of limiting foods high in cholesterol; Reviewed exercise goals and target of 150 minutes per week; Confirmed continues taking prescribed statin "every other day" as instructed  Hypertension: (Status: 05/29/22: Goal Met.) Long Term Goal Last practice recorded BP readings:  BP Readings from Last 3 Encounters:  02/10/22 115/72  01/06/22 122/82  11/24/21 132/78  Most recent eGFR/CrCl: No results found for: EGFR  No components found for: CRCL  Evaluation of current treatment plan related to hypertension self management and patient's adherence to plan as established by provider;   Reviewed prescribed diet low salt, heart healthy, low cholesterol Counseled on the importance of exercise goals with target of 150 minutes per week Discussed complications of poorly controlled blood pressure such as heart disease, stroke, circulatory complications, vision complications, kidney impairment, sexual dysfunction;  Screening for signs and symptoms of depression related to chronic disease state;  Assessed social determinant of health barriers;  Confirmed patient remains active on a regular basis- walks, works out at [employer], rides stationary bike at home: this was encouraged/  positive reinforcement  provided Confirmed patient monitors blood pressures at home weekly: we discussed general target blood pressure- she again reports all blood pressures at home "all good and in target," we reviewed recent blood pressures form provider office visits, as she has not been recording blood pressures at home because "they are all fine" Confirmed taking medication for blood pressure as prescribed     Plan:  No further follow up required: patient denies current care coordination/ care management needs and is agreeable to RN CM case closure today; RN CM case closure accordingly     Oneta Rack, RN, BSN, [place] [phone]: direct office

## 2022-07-07 ENCOUNTER — Encounter: Payer: Self-pay | Admitting: Emergency Medicine

## 2022-07-07 ENCOUNTER — Other Ambulatory Visit: Payer: Self-pay | Admitting: Emergency Medicine

## 2022-07-07 ENCOUNTER — Ambulatory Visit (INDEPENDENT_AMBULATORY_CARE_PROVIDER_SITE_OTHER): Payer: Medicare HMO | Admitting: Emergency Medicine

## 2022-07-07 VITALS — BP 146/90 | HR 68 | Temp 98.0°F | Ht 66.0 in | Wt 197.1 lb

## 2022-07-07 DIAGNOSIS — I10 Essential (primary) hypertension: Secondary | ICD-10-CM

## 2022-07-07 DIAGNOSIS — Z13820 Encounter for screening for osteoporosis: Secondary | ICD-10-CM | POA: Diagnosis not present

## 2022-07-07 DIAGNOSIS — R195 Other fecal abnormalities: Secondary | ICD-10-CM | POA: Diagnosis not present

## 2022-07-07 DIAGNOSIS — K219 Gastro-esophageal reflux disease without esophagitis: Secondary | ICD-10-CM | POA: Diagnosis not present

## 2022-07-07 DIAGNOSIS — N1831 Chronic kidney disease, stage 3a: Secondary | ICD-10-CM

## 2022-07-07 DIAGNOSIS — Z1211 Encounter for screening for malignant neoplasm of colon: Secondary | ICD-10-CM

## 2022-07-07 DIAGNOSIS — M159 Polyosteoarthritis, unspecified: Secondary | ICD-10-CM | POA: Diagnosis not present

## 2022-07-07 DIAGNOSIS — Z23 Encounter for immunization: Secondary | ICD-10-CM

## 2022-07-07 DIAGNOSIS — F17200 Nicotine dependence, unspecified, uncomplicated: Secondary | ICD-10-CM

## 2022-07-07 DIAGNOSIS — E785 Hyperlipidemia, unspecified: Secondary | ICD-10-CM

## 2022-07-07 DIAGNOSIS — R7303 Prediabetes: Secondary | ICD-10-CM

## 2022-07-07 DIAGNOSIS — E039 Hypothyroidism, unspecified: Secondary | ICD-10-CM

## 2022-07-07 DIAGNOSIS — R69 Illness, unspecified: Secondary | ICD-10-CM | POA: Diagnosis not present

## 2022-07-07 LAB — CBC WITH DIFFERENTIAL/PLATELET
Basophils Absolute: 0.1 10*3/uL (ref 0.0–0.1)
Basophils Relative: 0.8 % (ref 0.0–3.0)
Eosinophils Absolute: 0.2 10*3/uL (ref 0.0–0.7)
Eosinophils Relative: 3 % (ref 0.0–5.0)
HCT: 35.6 % — ABNORMAL LOW (ref 36.0–46.0)
Hemoglobin: 11.7 g/dL — ABNORMAL LOW (ref 12.0–15.0)
Lymphocytes Relative: 26.6 % (ref 12.0–46.0)
Lymphs Abs: 2 10*3/uL (ref 0.7–4.0)
MCHC: 33 g/dL (ref 30.0–36.0)
MCV: 81.6 fl (ref 78.0–100.0)
Monocytes Absolute: 0.5 10*3/uL (ref 0.1–1.0)
Monocytes Relative: 7 % (ref 3.0–12.0)
Neutro Abs: 4.8 10*3/uL (ref 1.4–7.7)
Neutrophils Relative %: 62.6 % (ref 43.0–77.0)
Platelets: 263 10*3/uL (ref 150.0–400.0)
RBC: 4.36 Mil/uL (ref 3.87–5.11)
RDW: 14.7 % (ref 11.5–15.5)
WBC: 7.7 10*3/uL (ref 4.0–10.5)

## 2022-07-07 LAB — COMPREHENSIVE METABOLIC PANEL
ALT: 14 U/L (ref 0–35)
AST: 20 U/L (ref 0–37)
Albumin: 4 g/dL (ref 3.5–5.2)
Alkaline Phosphatase: 43 U/L (ref 39–117)
BUN: 19 mg/dL (ref 6–23)
CO2: 27 mEq/L (ref 19–32)
Calcium: 9.6 mg/dL (ref 8.4–10.5)
Chloride: 104 mEq/L (ref 96–112)
Creatinine, Ser: 1.25 mg/dL — ABNORMAL HIGH (ref 0.40–1.20)
GFR: 45.06 mL/min — ABNORMAL LOW (ref >60.00)
Glucose, Bld: 82 mg/dL (ref 70–99)
Potassium: 4 mEq/L (ref 3.5–5.1)
Sodium: 139 mEq/L (ref 135–145)
Total Bilirubin: 0.5 mg/dL (ref 0.2–1.2)
Total Protein: 7.7 g/dL (ref 6.0–8.3)

## 2022-07-07 LAB — HEMOGLOBIN A1C: Hgb A1c MFr Bld: 6.5 % (ref 4.6–6.5)

## 2022-07-07 LAB — LIPID PANEL
Cholesterol: 146 mg/dL (ref 0–200)
HDL: 38.6 mg/dL — ABNORMAL LOW (ref >39.00)
LDL Cholesterol: 82 mg/dL (ref 0–99)
NonHDL: 107.73
Total CHOL/HDL Ratio: 4
Triglycerides: 128 mg/dL (ref 0.0–149.0)
VLDL: 25.6 mg/dL (ref 0.0–40.0)

## 2022-07-07 LAB — TSH: TSH: 6.54 u[IU]/mL — ABNORMAL HIGH (ref 0.35–5.50)

## 2022-07-07 MED ORDER — VALSARTAN-HYDROCHLOROTHIAZIDE 80-12.5 MG PO TABS: 1.0000 | ORAL_TABLET | Freq: Every day | ORAL | 3 refills | Status: DC

## 2022-07-07 MED ORDER — LEVOTHYROXINE SODIUM 112 MCG PO TABS: 112.0000 ug | ORAL_TABLET | Freq: Every day | ORAL | 3 refills | Status: DC

## 2022-07-07 MED ORDER — LEVOTHYROXINE SODIUM 100 MCG PO TABS: 100.0000 ug | ORAL_TABLET | Freq: Every day | ORAL | 3 refills | Status: DC

## 2022-07-07 NOTE — Assessment & Plan Note (Signed)
Positive Cologuard September 2022.  Needs colonoscopy. Referral placed today.

## 2022-07-07 NOTE — Assessment & Plan Note (Signed)
Hypertension not well controlled. Stop lisinopril HCTZ and start valsartan HCTZ 80-12.5 mg daily Advised to continue monitoring blood pressure readings at home daily for the next several weeks and keep a log. Cardiovascular risks associated with uncontrolled hypertension discussed. Diet Coralyn Mark approaches to stop hypertension discussed. Follow-up in 3 to 6 months.

## 2022-07-07 NOTE — Assessment & Plan Note (Signed)
Advised to stay well-hydrated and avoid NSAIDs. ?

## 2022-07-07 NOTE — Progress Notes (Signed)
Erika Cervantes 66 y.o.   Chief Complaint  Patient presents with   Follow-up    47mth f/u appt, pt still experiencing stiffness and tightness in hands and shoulders     HISTORY OF PRESENT ILLNESS: This is a 66y.o. female here for follow-up of multiple chronic medical problems #1 hypertension.  On lisinopril-HCTZ 20-12.5 mg.  Elevated blood pressure readings at home #2 osteoarthritis of multiple joints.  Last visit with rheumatologist Dr. RBenjamine Molalast April Still having issues with chronic multiple joint pain.  Takes acetaminophen and colchicine Requesting second opinion #3 chronic kidney disease.  Unable to take NSAIDs #4 hypothyroidism on Synthroid.  Needs TSH done today #5 GERD.  Stable.  On omeprazole #6 dyslipidemia on rosuvastatin 20 mg daily. #7 positive Cologuard test last September.  Needs colonoscopy #8 still smoking 2 to 3 cigarettes/day #9 prediabetes  HPI   Prior to Admission medications   Medication Sig Start Date End Date Taking? Authorizing Provider  albuterol (PROVENTIL HFA;VENTOLIN HFA) 108 (90 Base) MCG/ACT inhaler Inhale 2 puffs into the lungs every 6 (six) hours as needed. 12/07/17  Yes MJaynee Eagles PA-C  colchicine 0.6 MG tablet Take 1 tablet (0.6 mg total) by mouth daily as needed. 02/10/22  Yes Rice, CResa Miner MD  omeprazole (PRILOSEC) 20 MG capsule TAKE ONE CAPSULE BY MOUTH DAILY 01/15/22  Yes Ellarose Brandi, MInes Bloomer MD  rosuvastatin (CRESTOR) 20 MG tablet Take 1 tablet (20 mg total) by mouth daily. Patient taking differently: Take 20 mg by mouth every other day. 01/06/22  Yes Jagjit Riner, MInes Bloomer MD  acetaminophen (TYLENOL) 650 MG CR tablet Take 650 mg by mouth every 8 (eight) hours as needed for pain. Patient not taking: Reported on 02/10/2022    [provider]  dextromethorphan-guaiFENesin (MUCINEX DM) 30-600 MG 12hr tablet Take 1 tablet by mouth 2 (two) times daily. Patient not taking: Reported on 02/10/2022 11/24/21   SHorald Pollen MD   levothyroxine (SYNTHROID) 100 MCG tablet Take 1 tablet (100 mcg total) by mouth daily with breakfast. 01/06/22 04/06/22  SHorald Pollen MD  lisinopril-hydrochlorothiazide (ZESTORETIC) 20-12.5 MG tablet Take 1 tablet by mouth daily. 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   Primary osteoarthritis involving multiple joints 07/08/2021   Dyslipidemia 08/01/2019   Degenerative arthritis of thumb 09/04/2015   Vitamin D deficiency 05/26/2012   Hypothyroidism 02/26/2012   Hyperlipidemia with target LDL less than 100 02/26/2012   Essential hypertension 02/26/2012   Obesity (BMI 30.0-34.9) 02/26/2012   Bulging lumbar disc 02/26/2012   GERD (gastroesophageal reflux disease) 02/26/2012    Past Medical History:  Diagnosis Date   Allergy    GERD (gastroesophageal reflux disease)    High cholesterol    Hypertension    PONV (postoperative nausea and vomiting)    Thyroid disease     Past Surgical History:  Procedure Laterality Date   ABDOMINAL HYSTERECTOMY  late 1980s   Fibroids were indication.  pt says also had SPO bil.     APPENDECTOMY  late 1980s   done at time of hysto.     BACK SURGERY     BREAST  SURGERY     Right breast mass   ESOPHAGOGASTRODUODENOSCOPY (EGD) WITH PROPOFOL N/A 11/08/2018   Procedure: ESOPHAGOGASTRODUODENOSCOPY (EGD) WITH PROPOFOL;  Surgeon: Milus Banister, MD;  Location: [facility] ENDOSCOPY;  Service: Endoscopy;  Laterality: N/A;   Hammer toes     TONSILLECTOMY      Social History   Socioeconomic History   Marital status: Married    Spouse name: Not on file   Number of  children: Not on file   Years of education: Not on file   Highest education level: Not on file  Occupational History   Not on file  Tobacco Use   Smoking status: Every Day    Packs/day: 0.10    Years: 22.00    Total pack years: 2.20    Types: Cigarettes   Smokeless tobacco: Never   Tobacco comments:    3 cigs daiy  Vaping Use   Vaping Use: Never used  Substance and Sexual Activity   Alcohol use: No   Drug use: No   Sexual activity: Not on file  Other Topics Concern   Not on file  Social History Narrative   Not on file   Social Determinants of Health   Financial Resource Strain: Not on file  Food Insecurity: No Food Insecurity (05/29/2022)   Hunger Vital Sign    Worried About Running Out of Food in the Last Year: Never true    Ran Out of Food in the Last Year: Never true  Transportation Needs: No Transportation Needs (05/29/2022)   PRAPARE - Hydrologist (Medical): No    Lack of Transportation (Non-Medical): No  Physical Activity: Not on file  Stress: Not on file  Social Connections: Not on file  Intimate Partner Violence: Not on file    Family History  Problem Relation Age of Onset   Heart disease Mother    Rheum arthritis Mother      Review of Systems  Constitutional: Negative.  Negative for chills and fever.  HENT: Negative.  Negative for congestion and sore throat.   Respiratory: Negative.  Negative for cough and shortness of breath.   Cardiovascular: Negative.  Negative for chest pain and palpitations.  Gastrointestinal:  Negative for abdominal pain, diarrhea, nausea and vomiting.  Musculoskeletal:  Positive for joint pain.  Skin: Negative.  Negative for rash.  Neurological: Negative.  Negative for dizziness and headaches.  All other systems reviewed and are negative.  Today's Vitals   07/07/22 0923 07/07/22 0931  BP: (!) 152/90 (!) 146/90  Pulse: 68   Temp: 98 F (36.7 C)   TempSrc: Oral   SpO2: 93%   Weight: 197 lb 2 oz  (89.4 kg)   Height: '5\' 6"'$  (1.676 m)    Body mass index is 31.82 kg/m. Wt Readings from Last 3 Encounters:  07/07/22 197 lb 2 oz (89.4 kg)  02/10/22 194 lb 12.8 oz (88.4 kg)  01/06/22 196 lb (88.9 kg)     Physical Exam Vitals reviewed.  Constitutional:      Appearance: Normal appearance.  HENT:     Head: Normocephalic.     Mouth/Throat:     Mouth: Mucous membranes are moist.     Pharynx: Oropharynx is clear.  Eyes:     Extraocular Movements: Extraocular movements intact.     Conjunctiva/sclera: Conjunctivae normal.     Pupils: Pupils are equal, round, and reactive to light.  Cardiovascular:     Rate and Rhythm: Normal rate and regular rhythm.  Pulses: Normal pulses.     Heart sounds: Normal heart sounds.  Pulmonary:     Effort: Pulmonary effort is normal.     Breath sounds: Normal breath sounds.  Musculoskeletal:        General: Deformity present.     Right lower leg: No edema.     Left lower leg: No edema.  Skin:    General: Skin is warm and dry.     Capillary Refill: Capillary refill takes less than 2 seconds.  Neurological:     General: No focal deficit present.     Mental Status: She is alert and oriented to person, place, and time.  Psychiatric:        Mood and Affect: Mood normal.        Behavior: Behavior normal.      ASSESSMENT & PLAN: A total of 52 minutes was spent with the patient and counseling/coordination of care regarding preparing for this visit, review of most recent office visit notes, review of most recent rheumatologist office visit notes, review of multiple chronic medical problems and their management, review of all medications and changes made, cardiovascular risks associated with uncontrolled hypertension, education on nutrition, need for colon cancer screening with colonoscopy, prognosis, documentation, and need for follow-up.  Problem List Items Addressed This Visit       Cardiovascular and Mediastinum   Essential hypertension -  Primary    Hypertension not well controlled. Stop lisinopril HCTZ and start valsartan HCTZ 80-12.5 mg daily Advised to continue monitoring blood pressure readings at home daily for the next several weeks and keep a log. Cardiovascular risks associated with uncontrolled hypertension discussed. Diet Coralyn Mark approaches to stop hypertension discussed. Follow-up in 3 to 6 months.      Relevant Medications   valsartan-hydrochlorothiazide (DIOVAN-HCT) 80-12.5 MG tablet   Other Relevant Orders   Comprehensive metabolic panel   CBC with Differential/Platelet     Digestive   GERD (gastroesophageal reflux disease)    Stable and well-controlled. Continue omeprazole 20 mg daily as needed        Endocrine   Hypothyroidism    Clinically euthyroid.  TSH done today. Continue Synthroid 100 mcg daily.  Will adjust dose as needed.      Relevant Medications   levothyroxine (SYNTHROID) 100 MCG tablet   Other Relevant Orders   TSH   CBC with Differential/Platelet     Musculoskeletal and Integument   Primary osteoarthritis involving multiple joints    Currently active and affecting quality of life. Continue Tylenol and daily colchicine 0.6 mg Rheumatology referral placed today for second opinion.      Relevant Orders   Ambulatory referral to Rheumatology     Genitourinary   Stage 3a chronic kidney disease ([place])    Advised to stay well-hydrated and avoid NSAIDs.        Other   Dyslipidemia    Stable.  Diet and nutrition discussed. Continue rosuvastatin 20 mg daily. The 10-year ASCVD risk score (Arnett DK, et al., 2019) is: 26.3%   Values used to calculate the score:     Age: 43 years     Sex: Female     Is Non-Hispanic African American: Yes     Diabetic: No     Tobacco smoker: Yes     Systolic Blood Pressure: 585 mmHg     Is BP treated: Yes     HDL Cholesterol: 39.9 mg/dL     Total Cholesterol: 210 mg/dL  Relevant Orders   Lipid panel   CBC with Differential/Platelet    Current smoker    Cardiovascular and cancer risks associated with smoking discussed. Smoke cessation advice given.       Positive colorectal cancer screening using Cologuard test    Positive Cologuard September 2022.  Needs colonoscopy. Referral placed today.      Relevant Orders   Ambulatory referral to Gastroenterology   Other Visit Diagnoses     Osteoporosis screening       Relevant Orders   DG Bone Density   Colon cancer screening       Relevant Orders   Ambulatory referral to Gastroenterology   Need for vaccination       Relevant Orders   Flu Vaccine QUAD High Dose(Fluad)   Pneumococcal conjugate vaccine 20-valent (Prevnar 20)   Prediabetes       Relevant Orders   Hemoglobin A1c      Patient Instructions  Hypertension, Adult High blood pressure (hypertension) is when the force of blood pumping through the arteries is too strong. The arteries are the blood vessels that carry blood from the heart throughout the body. Hypertension forces the heart to work harder to pump blood and may cause arteries to become narrow or stiff. Untreated or uncontrolled hypertension can lead to a heart attack, heart failure, a stroke, kidney disease, and other problems. A blood pressure reading consists of a higher number over a lower number. Ideally, your blood pressure should be below 120/80. The first ("top") number is called the systolic pressure. It is a measure of the pressure in your arteries as your heart beats. The second ("bottom") number is called the diastolic pressure. It is a measure of the pressure in your arteries as the heart relaxes. What are the causes? The exact cause of this condition is not known. There are some conditions that result in high blood pressure. What increases the risk? Certain factors may make you more likely to develop high blood pressure. Some of these risk factors are under your control, including: Smoking. Not getting enough exercise or physical  activity. Being overweight. Having too much fat, sugar, calories, or salt (sodium) in your diet. Drinking too much alcohol. Other risk factors include: Having a personal history of heart disease, diabetes, high cholesterol, or kidney disease. Stress. Having a family history of high blood pressure and high cholesterol. Having obstructive sleep apnea. Age. The risk increases with age. What are the signs or symptoms? High blood pressure may not cause symptoms. Very high blood pressure (hypertensive crisis) may cause: Headache. Fast or irregular heartbeats (palpitations). Shortness of breath. Nosebleed. Nausea and vomiting. Vision changes. Severe chest pain, dizziness, and seizures. How is this diagnosed? This condition is diagnosed by measuring your blood pressure while you are seated, with your arm resting on a flat surface, your legs uncrossed, and your feet flat on the floor. The cuff of the blood pressure monitor will be placed directly against the skin of your upper arm at the level of your heart. Blood pressure should be measured at least twice using the same arm. Certain conditions can cause a difference in blood pressure between your right and left arms. If you have a high blood pressure reading during one visit or you have normal blood pressure with other risk factors, you may be asked to: Return on a different day to have your blood pressure checked again. Monitor your blood pressure at home for 1 week or longer. If you are diagnosed with  hypertension, you may have other blood or imaging tests to help your health care provider understand your overall risk for other conditions. How is this treated? This condition is treated by making healthy lifestyle changes, such as eating healthy foods, exercising more, and reducing your alcohol intake. You may be referred for counseling on a healthy diet and physical activity. Your health care provider may prescribe medicine if lifestyle changes  are not enough to get your blood pressure under control and if: Your systolic blood pressure is above 130. Your diastolic blood pressure is above 80. Your personal target blood pressure may vary depending on your medical conditions, your age, and other factors. Follow these instructions at home: Eating and drinking  Eat a diet that is high in fiber and potassium, and low in sodium, added sugar, and fat. An example of this eating plan is called the DASH diet. DASH stands for Dietary Approaches to Stop Hypertension. To eat this way: Eat plenty of fresh fruits and vegetables. Try to fill one half of your plate at each meal with fruits and vegetables. Eat whole grains, such as whole-wheat pasta, brown rice, or whole-grain bread. Fill about one fourth of your plate with whole grains. Eat or drink low-fat dairy products, such as skim milk or low-fat yogurt. Avoid fatty cuts of meat, processed or cured meats, and poultry with skin. Fill about one fourth of your plate with lean proteins, such as fish, chicken without skin, beans, eggs, or tofu. Avoid pre-made and processed foods. These tend to be higher in sodium, added sugar, and fat. Reduce your daily sodium intake. Many people with hypertension should eat less than 1,500 mg of sodium a day. Do not drink alcohol if: Your health care provider tells you not to drink. You are pregnant, may be pregnant, or are planning to become pregnant. If you drink alcohol: Limit how much you have to: 0-1 drink a day for women. 0-2 drinks a day for men. Know how much alcohol is in your drink. In the [place] one drink equals one 12 oz bottle of beer (355 mL), one 5 oz glass of wine (148 mL), or one 1 oz glass of hard liquor (44 mL). Lifestyle  Work with your health care provider to maintain a healthy body weight or to lose weight. Ask what an ideal weight is for you. Get at least 30 minutes of exercise that causes your heart to beat faster (aerobic exercise) most  days of the week. Activities may include walking, swimming, or biking. Include exercise to strengthen your muscles (resistance exercise), such as Pilates or lifting weights, as part of your weekly exercise routine. Try to do these types of exercises for 30 minutes at least 3 days a week. Do not use any products that contain nicotine or tobacco. These products include cigarettes, chewing tobacco, and vaping devices, such as e-cigarettes. If you need help quitting, ask your health care provider. Monitor your blood pressure at home as told by your health care provider. Keep all follow-up visits. This is important. Medicines Take over-the-counter and prescription medicines only as told by your health care provider. Follow directions carefully. Blood pressure medicines must be taken as prescribed. Do not skip doses of blood pressure medicine. Doing this puts you at risk for problems and can make the medicine less effective. Ask your health care provider about side effects or reactions to medicines that you should watch for. Contact a health care provider if you: Think you are having a reaction to  a medicine you are taking. Have headaches that keep coming back (recurring). Feel dizzy. Have swelling in your ankles. Have trouble with your vision. Get help right away if you: Develop a severe headache or confusion. Have unusual weakness or numbness. Feel faint. Have severe pain in your chest or abdomen. Vomit repeatedly. Have trouble breathing. These symptoms may be an emergency. Get help right away. Call 911. Do not wait to see if the symptoms will go away. Do not drive yourself to the hospital. Summary Hypertension is when the force of blood pumping through your arteries is too strong. If this condition is not controlled, it may put you at risk for serious complications. Your personal target blood pressure may vary depending on your medical conditions, your age, and other factors. For most people,  a normal blood pressure is less than 120/80. Hypertension is treated with lifestyle changes, medicines, or a combination of both. Lifestyle changes include losing weight, eating a healthy, low-sodium diet, exercising more, and limiting alcohol. This information is not intended to replace advice given to you by your health care provider. Make sure you discuss any questions you have with your health care provider. Document Revised: 08/19/2021 Document Reviewed: 08/19/2021 Elsevier Patient Education  [place], MD [facility]

## 2022-07-07 NOTE — Assessment & Plan Note (Signed)
Currently active and affecting quality of life. Continue Tylenol and daily colchicine 0.6 mg Rheumatology referral placed today for second opinion.

## 2022-07-07 NOTE — Assessment & Plan Note (Signed)
Clinically euthyroid.  TSH done today. Continue Synthroid 100 mcg daily.  Will adjust dose as needed.

## 2022-07-07 NOTE — Assessment & Plan Note (Signed)
Cardiovascular and cancer risks associated with smoking discussed. Smoke cessation advice given.

## 2022-07-07 NOTE — Patient Instructions (Signed)
Hypertension, Adult High blood pressure (hypertension) is when the force of blood pumping through the arteries is too strong. The arteries are the blood vessels that carry blood from the heart throughout the body. Hypertension forces the heart to work harder to pump blood and may cause arteries to become narrow or stiff. Untreated or uncontrolled hypertension can lead to a heart attack, heart failure, a stroke, kidney disease, and other problems. A blood pressure reading consists of a higher number over a lower number. Ideally, your blood pressure should be below 120/80. The first ("top") number is called the systolic pressure. It is a measure of the pressure in your arteries as your heart beats. The second ("bottom") number is called the diastolic pressure. It is a measure of the pressure in your arteries as the heart relaxes. What are the causes? The exact cause of this condition is not known. There are some conditions that result in high blood pressure. What increases the risk? Certain factors may make you more likely to develop high blood pressure. Some of these risk factors are under your control, including: Smoking. Not getting enough exercise or physical activity. Being overweight. Having too much fat, sugar, calories, or salt (sodium) in your diet. Drinking too much alcohol. Other risk factors include: Having a personal history of heart disease, diabetes, high cholesterol, or kidney disease. Stress. Having a family history of high blood pressure and high cholesterol. Having obstructive sleep apnea. Age. The risk increases with age. What are the signs or symptoms? High blood pressure may not cause symptoms. Very high blood pressure (hypertensive crisis) may cause: Headache. Fast or irregular heartbeats (palpitations). Shortness of breath. Nosebleed. Nausea and vomiting. Vision changes. Severe chest pain, dizziness, and seizures. How is this diagnosed? This condition is diagnosed by  measuring your blood pressure while you are seated, with your arm resting on a flat surface, your legs uncrossed, and your feet flat on the floor. The cuff of the blood pressure monitor will be placed directly against the skin of your upper arm at the level of your heart. Blood pressure should be measured at least twice using the same arm. Certain conditions can cause a difference in blood pressure between your right and left arms. If you have a high blood pressure reading during one visit or you have normal blood pressure with other risk factors, you may be asked to: Return on a different day to have your blood pressure checked again. Monitor your blood pressure at home for 1 week or longer. If you are diagnosed with hypertension, you may have other blood or imaging tests to help your health care provider understand your overall risk for other conditions. How is this treated? This condition is treated by making healthy lifestyle changes, such as eating healthy foods, exercising more, and reducing your alcohol intake. You may be referred for counseling on a healthy diet and physical activity. Your health care provider may prescribe medicine if lifestyle changes are not enough to get your blood pressure under control and if: Your systolic blood pressure is above 130. Your diastolic blood pressure is above 80. Your personal target blood pressure may vary depending on your medical conditions, your age, and other factors. Follow these instructions at home: Eating and drinking  Eat a diet that is high in fiber and potassium, and low in sodium, added sugar, and fat. An example of this eating plan is called the DASH diet. DASH stands for Dietary Approaches to Stop Hypertension. To eat this way: Eat   plenty of fresh fruits and vegetables. Try to fill one half of your plate at each meal with fruits and vegetables. Eat whole grains, such as whole-wheat pasta, brown rice, or whole-grain bread. Fill about one  fourth of your plate with whole grains. Eat or drink low-fat dairy products, such as skim milk or low-fat yogurt. Avoid fatty cuts of meat, processed or cured meats, and poultry with skin. Fill about one fourth of your plate with lean proteins, such as fish, chicken without skin, beans, eggs, or tofu. Avoid pre-made and processed foods. These tend to be higher in sodium, added sugar, and fat. Reduce your daily sodium intake. Many people with hypertension should eat less than 1,500 mg of sodium a day. Do not drink alcohol if: Your health care provider tells you not to drink. You are pregnant, may be pregnant, or are planning to become pregnant. If you drink alcohol: Limit how much you have to: 0-1 drink a day for women. 0-2 drinks a day for men. Know how much alcohol is in your drink. In the [place] one drink equals one 12 oz bottle of beer (355 mL), one 5 oz glass of wine (148 mL), or one 1 oz glass of hard liquor (44 mL). Lifestyle  Work with your health care provider to maintain a healthy body weight or to lose weight. Ask what an ideal weight is for you. Get at least 30 minutes of exercise that causes your heart to beat faster (aerobic exercise) most days of the week. Activities may include walking, swimming, or biking. Include exercise to strengthen your muscles (resistance exercise), such as Pilates or lifting weights, as part of your weekly exercise routine. Try to do these types of exercises for 30 minutes at least 3 days a week. Do not use any products that contain nicotine or tobacco. These products include cigarettes, chewing tobacco, and vaping devices, such as e-cigarettes. If you need help quitting, ask your health care provider. Monitor your blood pressure at home as told by your health care provider. Keep all follow-up visits. This is important. Medicines Take over-the-counter and prescription medicines only as told by your health care provider. Follow directions carefully. Blood  pressure medicines must be taken as prescribed. Do not skip doses of blood pressure medicine. Doing this puts you at risk for problems and can make the medicine less effective. Ask your health care provider about side effects or reactions to medicines that you should watch for. Contact a health care provider if you: Think you are having a reaction to a medicine you are taking. Have headaches that keep coming back (recurring). Feel dizzy. Have swelling in your ankles. Have trouble with your vision. Get help right away if you: Develop a severe headache or confusion. Have unusual weakness or numbness. Feel faint. Have severe pain in your chest or abdomen. Vomit repeatedly. Have trouble breathing. These symptoms may be an emergency. Get help right away. Call 911. Do not wait to see if the symptoms will go away. Do not drive yourself to the hospital. Summary Hypertension is when the force of blood pumping through your arteries is too strong. If this condition is not controlled, it may put you at risk for serious complications. Your personal target blood pressure may vary depending on your medical conditions, your age, and other factors. For most people, a normal blood pressure is less than 120/80. Hypertension is treated with lifestyle changes, medicines, or a combination of both. Lifestyle changes include losing weight, eating a healthy,   low-sodium diet, exercising more, and limiting alcohol. This information is not intended to replace advice given to you by your health care provider. Make sure you discuss any questions you have with your health care provider. Document Revised: 08/19/2021 Document Reviewed: 08/19/2021 Elsevier Patient Education  2023 [employer] Inc[employer]  

## 2022-07-07 NOTE — Assessment & Plan Note (Signed)
Stable and well-controlled. Continue omeprazole 20 mg daily as needed

## 2022-07-07 NOTE — Assessment & Plan Note (Signed)
Stable.  Diet and nutrition discussed. Continue rosuvastatin 20 mg daily. The 10-year ASCVD risk score (Arnett DK, et al., 2019) is: 26.3%   Values used to calculate the score:     Age: 66 years     Sex: Female     Is Non-Hispanic African American: Yes     Diabetic: No     Tobacco smoker: Yes     Systolic Blood Pressure: 484 mmHg     Is BP treated: Yes     HDL Cholesterol: 39.9 mg/dL     Total Cholesterol: 210 mg/dL

## 2022-07-10 ENCOUNTER — Other Ambulatory Visit: Payer: Self-pay | Admitting: Emergency Medicine

## 2022-07-10 DIAGNOSIS — K219 Gastro-esophageal reflux disease without esophagitis: Secondary | ICD-10-CM

## 2022-07-14 ENCOUNTER — Ambulatory Visit (INDEPENDENT_AMBULATORY_CARE_PROVIDER_SITE_OTHER)
Admission: RE | Admit: 2022-07-14 | Discharge: 2022-07-14 | Disposition: A | Discharge status: Home / Self Care | Payer: Medicare HMO | Source: Ambulatory Visit | Attending: Emergency Medicine | Admitting: Emergency Medicine

## 2022-07-14 DIAGNOSIS — Z13820 Encounter for screening for osteoporosis: Secondary | ICD-10-CM | POA: Diagnosis not present

## 2022-07-20 ENCOUNTER — Telehealth: Payer: Medicare HMO

## 2022-07-27 ENCOUNTER — Encounter: Payer: Self-pay | Admitting: Gastroenterology

## 2022-08-06 NOTE — Progress Notes (Deleted)
Office Visit Note  Patient: Erika Cervantes             Date of Birth: January 27, 1956           MRN: [id number]             PCP: Horald Pollen, MD Referring: Horald Pollen, * Visit Date: 08/11/2022   Subjective:  No chief complaint on file.   History of Present Illness: Erika Cervantes is a 66 y.o. female here for follow up for follow up for bilateral hand pain and swelling with hand OA and periarticular calcifications   Previous HPI 02/10/2022 Erika Cervantes is a 66 y.o. female here for follow up for bilateral hand pain and swelling with hand OA and periarticular calcifications on colchicine. She is taking the medication once daily and when she has an exacerbation of symptoms increases up to twice daily. This is controlled her swelling very well and aching mostly but still feels very stiff. She has the best relief when her hands are in warm water and after using and stretching them for a while improves. More recently has some catching or locking problem in her right elbow getting stuck in position when flexed. This is painful more on the ulnar side. She also has some pain at the right ankle anterior and lateral side worst with aching and some pins and needles sensation positionally.   Previous HPIsus 11/05/21 Erika Cervantes is a 66 y.o. female here for follow up for hand pain and swelling trying treatment with colchicine. She had initial response with medrol taper and then felt partially better on colchicine. She tried increasing colchicine to 0.6 mg twice daily due to ongoing symptoms and good improvement in hand pain and swelling. She continues to have a lot of stiffness both in her hands and all over, worst first thing in the mornings. She notices some catching or locking movement in the right middle finger and now has more symptoms on the right instead of the left.   Previous HPI 08/19/21 Erika Cervantes is a 66 y.o. female here for arthritis of bilateral hands with increase pain and swelling  particularly at PIP and DIP joints.  She has had chronic osteoarthritis symptoms in multiple sites particularly in the lumbar spine and the left thumb in the past also some knee stiffness and crepitus.  But for the last about 5 months has increased hand pain morning stiffness and intermittent swelling.  Symptoms have forced her to stop wearing her rings most of the time due to swelling.  Worst affected is her left ring finger with swelling and hyperpigmentation at the PIP joint.  She weeks ago with some morning numbness has previously had carpal tunnel syndrome that improved with conservative management including wrist braces.  Outside of the hand pain she has had some increase in pain with the right shoulder particularly lying on her side in bed and increased clicking or popping of her knee use with sitting standing and when walking. She has taken NSAIDs for this problem but limited use recommended due to mild renal function impairment and due to GERD also has positive Cologuard study. Lab studies in PCP office last month showing low positive ANA and elevated sedimentation rate, negative rheumatoid factor and CCP.   No Rheumatology ROS completed.   PMFS History:  Patient Active Problem List   Diagnosis Date Noted   Positive colorectal cancer screening using Cologuard test 07/07/2022   Stage 3a chronic kidney disease ([place]) 01/08/2022   Post  viral syndrome 01/06/2022   Current smoker 01/06/2022   Bilateral hand pain 08/28/2021   Positive ANA (antinuclear antibody) 08/28/2021   Inflammatory arthritis 08/28/2021   Primary osteoarthritis involving multiple joints 07/08/2021   Dyslipidemia 08/01/2019   Degenerative arthritis of thumb 09/04/2015   Vitamin D deficiency 05/26/2012   Hypothyroidism 02/26/2012   Hyperlipidemia with target LDL less than 100 02/26/2012   Essential hypertension 02/26/2012   Obesity (BMI 30.0-34.9) 02/26/2012   Bulging lumbar disc 02/26/2012   GERD (gastroesophageal reflux  disease) 02/26/2012    Past Medical History:  Diagnosis Date   Allergy    GERD (gastroesophageal reflux disease)    High cholesterol    Hypertension    PONV (postoperative nausea and vomiting)    Thyroid disease     Family History  Problem Relation Age of Onset   Heart disease Mother    Rheum arthritis Mother    Past Surgical History:  Procedure Laterality Date   ABDOMINAL HYSTERECTOMY  late 1980s   Fibroids were indication.  pt says also had SPO bil.     APPENDECTOMY  late 1980s   done at time of hysto.     BACK SURGERY     BREAST SURGERY     Right breast mass   ESOPHAGOGASTRODUODENOSCOPY (EGD) WITH PROPOFOL N/A 11/08/2018   Procedure: ESOPHAGOGASTRODUODENOSCOPY (EGD) WITH PROPOFOL;  Surgeon: Milus Banister, MD;  Location: [facility] ENDOSCOPY;  Service: Endoscopy;  Laterality: N/A;   Hammer toes     TONSILLECTOMY     Social History   Social History Narrative   Not on file   Immunization History  Administered Date(s) Administered   Fluad Quad(high Dose 65+) 07/08/2021, 07/07/2022   Influenza Split 08/28/2011   Influenza,inj,Quad PF,6+ Mos 10/06/2013, 07/10/2015, 07/15/2016, 10/24/2018, 08/01/2019   PFIZER(Purple Top)SARS-COV-2 Vaccination 01/09/2020, 01/29/2020, 09/03/2020   PNEUMOCOCCAL CONJUGATE-20 07/07/2022   Pneumococcal Polysaccharide-23 08/01/2019   Td 10/26/2002   Tdap 05/26/2012     Objective: Vital Signs: There were no vitals taken for this visit.   Physical Exam   Musculoskeletal Exam: ***  CDAI Exam: CDAI Score: -- Patient Global: --; Provider Global: -- Swollen: --; Tender: -- Joint Exam 08/11/2022   No joint exam has been documented for this visit   There is currently no information documented on the homunculus. Go to the Rheumatology activity and complete the homunculus joint exam.  Investigation: No additional findings.  Imaging: DG Bone Density  Result Date: 07/14/2022 Table formatting from the original result was not included. Date of  study: 07/14/2022 Exam: DUAL X-RAY ABSORPTIOMETRY (DXA) FOR BONE MINERAL DENSITY (BMD) Instrument: [employer] Requesting Provider: PCP Indication: screening for osteoporosis Comparison: none (please note that it is not possible to compare data from different instruments) Clinical data: Pt is a 66 y.o. female without previous fractures. Results:  Lumbar spine L1-L4 Femoral neck (FN) 33% distal radius T-score +3.0 RFN: -0.6 LFN: -0.9 n/a Assessment: the BMD is normal according to the [facility] classification for osteoporosis (see below). Fracture risk: low FRAX score: not calculated due to normal BMD Comments: the technical quality of the study is good Recommend optimizing calcium (1200 mg/day) and vitamin D (800 IU/day) intake. No pharmacological treatment is indicated. Followup: Repeat BMD is appropriate after 2 years. WHO criteria for diagnosis of osteoporosis in postmenopausal women and in men 48 y/o or older: - normal: T-score -1.0 to + 1.0 - osteopenia/low bone density: T-score between -2.5 and -1.0 - osteoporosis: T-score below -2.5 - severe osteoporosis: T-score below -2.5  with history of fragility fracture Note: although not part of the WHO classification, the presence of a fragility fracture, regardless of the T-score, should be considered diagnostic of osteoporosis, provided other causes for the fracture have been excluded. Philemon Kingdom, MD [facility]   Recent Labs: Lab Results  Component Value Date   WBC 7.7 07/07/2022   HGB 11.7 (L) 07/07/2022   PLT 263.0 07/07/2022   NA 139 07/07/2022   K 4.0 07/07/2022   CL 104 07/07/2022   CO2 27 07/07/2022   GLUCOSE 82 07/07/2022   BUN 19 07/07/2022   CREATININE 1.25 (H) 07/07/2022   BILITOT 0.5 07/07/2022   ALKPHOS 43 07/07/2022   AST 20 07/07/2022   ALT 14 07/07/2022   PROT 7.7 07/07/2022   ALBUMIN 4.0 07/07/2022   CALCIUM 9.6 07/07/2022   GFRAA 61 10/29/2020    Speciality Comments: No specialty comments  available.  Procedures:  No procedures performed Allergies: Darvocet [propoxyphene n-acetaminophen]   Assessment / Plan:     Visit Diagnoses: No diagnosis found.  ***  Orders: No orders of the defined types were placed in this encounter.  No orders of the defined types were placed in this encounter.    Follow-Up Instructions: No follow-ups on file.   Bertram Savin, RT  Note - This record has been created using Editor, commissioning.  Chart creation errors have been sought, but may not always  have been located. Such creation errors do not reflect on  the standard of medical care.

## 2022-08-11 ENCOUNTER — Ambulatory Visit (AMBULATORY_SURGERY_CENTER): Payer: Self-pay

## 2022-08-11 ENCOUNTER — Ambulatory Visit: Payer: Medicare HMO | Admitting: Internal Medicine

## 2022-08-11 VITALS — Ht 66.0 in | Wt 193.0 lb

## 2022-08-11 DIAGNOSIS — R195 Other fecal abnormalities: Secondary | ICD-10-CM

## 2022-08-11 DIAGNOSIS — R768 Other specified abnormal immunological findings in serum: Secondary | ICD-10-CM

## 2022-08-11 DIAGNOSIS — Z1211 Encounter for screening for malignant neoplasm of colon: Secondary | ICD-10-CM

## 2022-08-11 DIAGNOSIS — M1990 Unspecified osteoarthritis, unspecified site: Secondary | ICD-10-CM

## 2022-08-11 NOTE — Progress Notes (Signed)
No egg or soy allergy known to patient  No issues known to pt with past sedation with any surgeries or procedures Patient denies ever being told they had issues or difficulty with intubation  No FH of Malignant Hyperthermia Pt is not on diet pills Pt is not on  home 02  Pt is not on blood thinners  Pt denies issues with constipation  No A fib or A flutter Have any cardiac testing pending--denied Pt instructed to use [email or web address] or GoodRx for a price reduction on prep   

## 2022-08-14 NOTE — Progress Notes (Signed)
Office Visit Note  Patient: Erika Cervantes             Date of Birth: [birth year]           MRN: [id number]             PCP: Horald Pollen, MD Referring: Horald Pollen, * Visit Date: 08/18/2022   Subjective:  Follow-up (Joint pain in hands, shoulders, ankles, and bottoms of the feet. Patient states feeling very stiff. )   History of Present Illness: Erika Cervantes is a 66 y.o. female here for follow up for bilateral hand pain and swelling with hand OA and periarticular calcification.  She does find the colchicine effective for improving the swelling inflammation in her fingers but still feels joint pain and stiffness in multiple areas.  Particularly having increased trouble with bilateral shoulders compared to before.  Pain in ankles and bottom of the feet as well as more associated when on her feet.   Previous HPI 02/10/2022 [name] is a 66 y.o. female here for follow up for bilateral hand pain and swelling with hand OA and periarticular calcifications on colchicine. She is taking the medication once daily and when she has an exacerbation of symptoms increases up to twice daily. This is controlled her swelling very well and aching mostly but still feels very stiff. She has the best relief when her hands are in warm water and after using and stretching them for a while improves. More recently has some catching or locking problem in her right elbow getting stuck in position when flexed. This is painful more on the ulnar side. She also has some pain at the right ankle anterior and lateral side worst with aching and some pins and needles sensation positionally.   Previous HPIsus 11/05/21 Erika Cervantes is a 66 y.o. female here for follow up for hand pain and swelling trying treatment with colchicine. She had initial response with medrol taper and then felt partially better on colchicine. She tried increasing colchicine to 0.6 mg twice daily due to ongoing symptoms and good improvement in  hand pain and swelling. She continues to have a lot of stiffness both in her hands and all over, worst first thing in the mornings. She notices some catching or locking movement in the right middle finger and now has more symptoms on the right instead of the left.   Previous HPI 08/19/21 Erika Cervantes is a 66 y.o. female here for arthritis of bilateral hands with increase pain and swelling particularly at PIP and DIP joints.  She has had chronic osteoarthritis symptoms in multiple sites particularly in the lumbar spine and the left thumb in the past also some knee stiffness and crepitus.  But for the last about 5 months has increased hand pain morning stiffness and intermittent swelling.  Symptoms have forced her to stop wearing her rings most of the time due to swelling.  Worst affected is her left ring finger with swelling and hyperpigmentation at the PIP joint.  She weeks ago with some morning numbness has previously had carpal tunnel syndrome that improved with conservative management including wrist braces.  Outside of the hand pain she has had some increase in pain with the right shoulder particularly lying on her side in bed and increased clicking or popping of her knee use with sitting standing and when walking. She has taken NSAIDs for this problem but limited use recommended due to mild renal function impairment and due to GERD also has positive  Cologuard study. Lab studies in PCP office last month showing low positive ANA and elevated sedimentation rate, negative rheumatoid factor and CCP.   Review of Systems  Constitutional:  Positive for fatigue.  HENT:  Negative for mouth sores and mouth dryness.   Eyes:  Positive for dryness.  Respiratory:  Negative for shortness of breath.   Cardiovascular:  Negative for chest pain and palpitations.  Gastrointestinal:  Negative for blood in stool, constipation and diarrhea.  Endocrine: Negative for increased urination.  Genitourinary:  Negative for  involuntary urination.  Musculoskeletal:  Positive for joint pain, gait problem, joint pain, myalgias, muscle weakness, morning stiffness, muscle tenderness and myalgias. Negative for joint swelling.  Skin:  Negative for color change, rash, hair loss and sensitivity to sunlight.  Allergic/Immunologic: Negative for susceptible to infections.  Neurological:  Negative for dizziness and headaches.  Hematological:  Negative for swollen glands.  Psychiatric/Behavioral:  Positive for sleep disturbance. Negative for depressed mood. The patient is not nervous/anxious.     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   Current smoker 01/06/2022   Bilateral hand pain 08/28/2021   Positive ANA (antinuclear antibody) 08/28/2021   Inflammatory arthritis 08/28/2021   Primary osteoarthritis involving multiple joints 07/08/2021   Dyslipidemia 08/01/2019   Degenerative arthritis of thumb 09/04/2015   Vitamin D deficiency 05/26/2012   Hypothyroidism 02/26/2012   Hyperlipidemia with target LDL less than 100 02/26/2012   Essential hypertension 02/26/2012   Obesity (BMI 30.0-34.9) 02/26/2012   Bulging lumbar disc 02/26/2012   GERD (gastroesophageal reflux disease) 02/26/2012    Past Medical History:  Diagnosis Date   Allergy    Arthritis    GERD (gastroesophageal reflux disease)    High cholesterol    Hypertension    PONV (postoperative nausea and vomiting)    Thyroid disease     Family History  Problem Relation Age of Onset   Heart disease Mother    Rheum arthritis Mother    Colon cancer Neg Hx    Esophageal cancer Neg Hx    Stomach cancer Neg Hx    Rectal cancer Neg Hx    Past Surgical History:  Procedure Laterality Date   ABDOMINAL HYSTERECTOMY  late 1980s   Fibroids were indication.  pt says also had SPO bil.      APPENDECTOMY  late 1980s   done at time of hysto.     BACK SURGERY     BREAST SURGERY     Right breast mass   ESOPHAGOGASTRODUODENOSCOPY (EGD) WITH PROPOFOL N/A 11/08/2018   Procedure: ESOPHAGOGASTRODUODENOSCOPY (EGD) WITH PROPOFOL;  Surgeon: Milus Banister, MD;  Location: [facility] ENDOSCOPY;  Service: Endoscopy;  Laterality: N/A;   Hammer toes     TONSILLECTOMY     Social History   Social History Narrative   Not on file   Immunization History  Administered Date(s) Administered   Fluad Quad(high Dose 65+) 07/08/2021, 07/07/2022   Influenza Split 08/28/2011   Influenza,inj,Quad PF,6+ Mos 10/06/2013, 07/10/2015, 07/15/2016, 10/24/2018, 08/01/2019   PFIZER(Purple Top)SARS-COV-2 Vaccination 01/09/2020, 01/29/2020, 09/03/2020   PNEUMOCOCCAL CONJUGATE-20 07/07/2022   Pneumococcal Polysaccharide-23 08/01/2019   Td 10/26/2002   Tdap 05/26/2012     Objective: Vital Signs: BP (!) 145/74 (BP Location: Right Arm, Patient Position: Sitting, Cuff Size: Normal)   Pulse 97   Resp 15   Ht  $'5\' 6"'j$  (1.676 m)   Wt 197 lb 3.2 oz (89.4 kg)   BMI 31.83 kg/m    Physical Exam Cardiovascular:     Rate and Rhythm: Normal rate and regular rhythm.  Pulmonary:     Effort: Pulmonary effort is normal.     Breath sounds: Normal breath sounds.  Skin:    General: Skin is warm and dry.  Neurological:     Mental Status: She is alert.  Psychiatric:        Mood and Affect: Mood normal.      Musculoskeletal Exam:  Neck full ROM no tenderness Shoulders full ROM tenderness to pressure more posteriorly on joints no palpable effusions Elbows full ROM no tenderness or swelling Wrists full ROM no tenderness or swelling Fingers slightly restricted flexion range of motion worse on the left hand, Heberden's nodes on hands bilaterally Ankles full ROM right worse than left ankle tenderness bilaterally with no definite joint effusion palpable trace overlying edema   Investigation: No additional  findings.  Imaging: XR Shoulder Left  Result Date: 08/31/2022 X-ray left shoulder 4 views Normal internal and external rotation position.  Acromiohumeral distance appears normal.  AC joint space appears normal.  Possible very early calcifications or patches present along lateral aspect in subacromial space without definite osteophyte or enthesophytes. Impression Left shoulder x-ray with no significant arthritis changes  XR Shoulder Right  Result Date: 08/31/2022 X-ray right shoulder 4 views Glenohumeral joint space grossly intact with there are osteophyte process along inferior border best viewed in internal rotation.  Internal/external rotation position appears normal normal acromiohumeral distance.  AC joint space appears normal.  No abnormal calcifications or erosions seen. Impression Glenohumeral joint osteoarthritis no abnormal calcification or erosive changes   Recent Labs: Lab Results  Component Value Date   WBC 7.7 07/07/2022   HGB 11.7 (L) 07/07/2022   PLT 263.0 07/07/2022   NA 139 07/07/2022   K 4.0 07/07/2022   CL 104 07/07/2022   CO2 27 07/07/2022   GLUCOSE 82 07/07/2022   BUN 19 07/07/2022   CREATININE 1.25 (H) 07/07/2022   BILITOT 0.5 07/07/2022   ALKPHOS 43 07/07/2022   AST 20 07/07/2022   ALT 14 07/07/2022   PROT 7.7 07/07/2022   ALBUMIN 4.0 07/07/2022   CALCIUM 9.6 07/07/2022   GFRAA 61 10/29/2020    Speciality Comments: No specialty comments available.  Procedures:  No procedures performed Allergies: Darvocet [propoxyphene n-acetaminophen]   Assessment / Plan:     Visit Diagnoses: Inflammatory arthritis Positive ANA (antinuclear antibody) Bilateral hand pain  Overall inflammation appears reasonably well controlled with no synovitis or discoloration present on exam today and not much tenderness.  More stiffness and pain with use.  I suspect more of her problems at this point are related to osteoarthritis more so than active inflammatory disease.   Unfortunately still not a good candidate for NSAIDs with chronic renal disease.  Chronic pain of both shoulders - Plan: XR Shoulder Right, XR Shoulder Left  Increase shoulder stiffness and tenderness no restriction in joint range of motion and symptoms are more recent.  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  X-rays do not show any obvious abnormal calcifications.  Does have some glenohumeral joint osteoarthritis more visible on the right shoulder.  Orders: Orders Placed This Encounter  Procedures   XR Shoulder Right   XR Shoulder Left   No orders of the defined types were placed in  this encounter.    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   CCollier Salina MD  Note - This record has been created using [employer]  Chart creation errors have been sought, but may not always  have been located. Such creation errors do not reflect on  the standard of medical care.

## 2022-08-18 ENCOUNTER — Encounter: Payer: Self-pay | Admitting: Internal Medicine

## 2022-08-18 ENCOUNTER — Ambulatory Visit (INDEPENDENT_AMBULATORY_CARE_PROVIDER_SITE_OTHER): Payer: Medicare HMO

## 2022-08-18 ENCOUNTER — Ambulatory Visit: Payer: Medicare HMO | Attending: Internal Medicine | Admitting: Internal Medicine

## 2022-08-18 VITALS — BP 145/74 | HR 97 | Resp 15 | Ht 66.0 in | Wt 197.2 lb

## 2022-08-18 DIAGNOSIS — G8929 Other chronic pain: Secondary | ICD-10-CM

## 2022-08-18 DIAGNOSIS — M25511 Pain in right shoulder: Secondary | ICD-10-CM

## 2022-08-18 DIAGNOSIS — M79642 Pain in left hand: Secondary | ICD-10-CM | POA: Diagnosis not present

## 2022-08-18 DIAGNOSIS — R768 Other specified abnormal immunological findings in serum: Secondary | ICD-10-CM

## 2022-08-18 DIAGNOSIS — M1990 Unspecified osteoarthritis, unspecified site: Secondary | ICD-10-CM | POA: Diagnosis not present

## 2022-08-18 DIAGNOSIS — M25512 Pain in left shoulder: Secondary | ICD-10-CM

## 2022-08-18 DIAGNOSIS — M79641 Pain in right hand: Secondary | ICD-10-CM

## 2022-08-26 ENCOUNTER — Telehealth: Payer: Self-pay

## 2022-08-26 NOTE — Telephone Encounter (Signed)
Patient called inquiring about x-ray results from visit on 08/18/2022.   Patient also stated that she is having severe joint pain in her hands, shoulders, and ankles with aching and tightness. Patient states she is taking colchicine as prescribed but is wondering if there is some other prescription Dr. Benjamine Mola could recommend for the joint pains.   Please advise, thanks!

## 2022-08-31 ENCOUNTER — Encounter: Payer: Self-pay | Admitting: Gastroenterology

## 2022-08-31 ENCOUNTER — Telehealth: Payer: Self-pay | Admitting: *Deleted

## 2022-08-31 NOTE — Telephone Encounter (Signed)
Patient contacted the office stating this is the second time she has called. Patient states she was seen in the office about 2 weeks ago and has not heard about her x-ray results. Patient states she is still having pain in her shoulders that is not improving. Patient states her hands are tight and hurt. Patient states Tylenol not helping. Patient is requesting a call back with x-ray results and next steps. Please advise regarding results and next steps.

## 2022-09-02 ENCOUNTER — Encounter: Payer: Self-pay | Admitting: Certified Registered Nurse Anesthetist

## 2022-09-03 NOTE — Telephone Encounter (Signed)
Patient contacted the office called the office again and states she would like her results. Sh is still having pain. She states "this is very unprofessional". Patient hung up before anything else can be said.

## 2022-09-07 ENCOUNTER — Telehealth: Payer: Self-pay | Admitting: Emergency Medicine

## 2022-09-07 NOTE — Telephone Encounter (Signed)
LVM for pt to rtn my call to schedule AWV with [facility] call back # [phone] 

## 2022-09-08 ENCOUNTER — Other Ambulatory Visit (INDEPENDENT_AMBULATORY_CARE_PROVIDER_SITE_OTHER): Payer: Medicare HMO

## 2022-09-08 ENCOUNTER — Ambulatory Visit (AMBULATORY_SURGERY_CENTER): Payer: Medicare HMO | Admitting: Gastroenterology

## 2022-09-08 ENCOUNTER — Other Ambulatory Visit: Payer: Self-pay

## 2022-09-08 ENCOUNTER — Encounter: Payer: Self-pay | Admitting: Gastroenterology

## 2022-09-08 VITALS — BP 136/57 | HR 84 | Temp 98.9°F | Resp 14 | Ht 66.0 in | Wt 193.0 lb

## 2022-09-08 DIAGNOSIS — Z1211 Encounter for screening for malignant neoplasm of colon: Secondary | ICD-10-CM

## 2022-09-08 DIAGNOSIS — K219 Gastro-esophageal reflux disease without esophagitis: Secondary | ICD-10-CM | POA: Diagnosis not present

## 2022-09-08 DIAGNOSIS — K8500 Idiopathic acute pancreatitis without necrosis or infection: Secondary | ICD-10-CM

## 2022-09-08 DIAGNOSIS — D123 Benign neoplasm of transverse colon: Secondary | ICD-10-CM

## 2022-09-08 DIAGNOSIS — R195 Other fecal abnormalities: Secondary | ICD-10-CM | POA: Diagnosis not present

## 2022-09-08 DIAGNOSIS — D124 Benign neoplasm of descending colon: Secondary | ICD-10-CM | POA: Diagnosis not present

## 2022-09-08 DIAGNOSIS — D122 Benign neoplasm of ascending colon: Secondary | ICD-10-CM | POA: Diagnosis not present

## 2022-09-08 DIAGNOSIS — I10 Essential (primary) hypertension: Secondary | ICD-10-CM | POA: Diagnosis not present

## 2022-09-08 LAB — BUN: BUN: 12 mg/dL (ref 6–23)

## 2022-09-08 LAB — CREATININE, SERUM: Creatinine, Ser: 1.01 mg/dL (ref 0.40–1.20)

## 2022-09-08 MED ORDER — SODIUM CHLORIDE 0.9 % IV SOLN: 500.0000 mL | Freq: Once | INTRAVENOUS | Status: DC

## 2022-09-08 NOTE — Progress Notes (Signed)
Report given to PACU, vss 

## 2022-09-08 NOTE — Patient Instructions (Signed)
Dr Mansouraty's office will call with date and time of CT scan  YOU HAD AN ENDOSCOPIC PROCEDURE TODAY AT [place]:   Refer to the procedure report that was given to you for any specific questions about what was found during the examination.  If the procedure report does not answer your questions, please call your gastroenterologist to clarify.  If you requested that your care partner not be given the details of your procedure findings, then the procedure report has been included in a sealed envelope for you to review at your convenience later.  YOU SHOULD EXPECT: Some feelings of bloating in the abdomen. Passage of more gas than usual.  Walking can help get rid of the air that was put into your GI tract during the procedure and reduce the bloating. If you had a lower endoscopy (such as a colonoscopy or flexible sigmoidoscopy) you may notice spotting of blood in your stool or on the toilet paper. If you underwent a bowel prep for your procedure, you may not have a normal bowel movement for a few days.  Please Note:  You might notice some irritation and congestion in your nose or some drainage.  This is from the oxygen used during your procedure.  There is no need for concern and it should clear up in a day or so.  SYMPTOMS TO REPORT IMMEDIATELY:  Following lower endoscopy (colonoscopy or flexible sigmoidoscopy):  Excessive amounts of blood in the stool  Significant tenderness or worsening of abdominal pains  Swelling of the abdomen that is new, acute  Fever of 100F or higher  For urgent or emergent issues, a gastroenterologist can be reached at any hour by calling [phone]. Do not use MyChart messaging for urgent concerns.    DIET:  We do recommend a small meal at first, but then you may proceed to your regular diet.  Drink plenty of fluids but you should avoid alcoholic beverages for 24 hours.  ACTIVITY:  You should plan to take it easy for the rest of today and you  should NOT DRIVE or use heavy machinery until tomorrow (because of the sedation medicines used during the test).    FOLLOW UP: Our staff will call the number listed on your records the next business day following your procedure.  We will call around 7:15- 8:00 am to check on you and address any questions or concerns that you may have regarding the information given to you following your procedure. If we do not reach you, we will leave a message.     If any biopsies were taken you will be contacted by phone or by letter within the next 1-3 weeks.  Please call us at [phone] if you have not heard about the biopsies in 3 weeks.    SIGNATURES/CONFIDENTIALITY: You and/or your care partner have signed paperwork which will be entered into your electronic medical record.  These signatures attest to the fact that that the information above on your After Visit Summary has been reviewed and is understood.  Full responsibility of the confidentiality of this discharge information lies with you and/or your care-partner.

## 2022-09-08 NOTE — Progress Notes (Signed)
GASTROENTEROLOGY PROCEDURE H&P NOTE   Primary Care Physician: Horald Pollen, MD  HPI: Erika Cervantes is a 66 y.o. female who presents for Colonoscopy for screening.  Past Medical History:  Diagnosis Date   Allergy    Arthritis    GERD (gastroesophageal reflux disease)    High cholesterol    Hypertension    PONV (postoperative nausea and vomiting)    Thyroid disease    Past Surgical History:  Procedure Laterality Date   ABDOMINAL HYSTERECTOMY  late 1980s   Fibroids were indication.  pt says also had SPO bil.     APPENDECTOMY  late 1980s   done at time of hysto.     BACK SURGERY     BREAST SURGERY     Right breast mass   ESOPHAGOGASTRODUODENOSCOPY (EGD) WITH PROPOFOL N/A 11/08/2018   Procedure: ESOPHAGOGASTRODUODENOSCOPY (EGD) WITH PROPOFOL;  Surgeon: Milus Banister, MD;  Location: [facility] ENDOSCOPY;  Service: Endoscopy;  Laterality: N/A;   Hammer toes     TONSILLECTOMY     Current Outpatient Medications  Medication Sig Dispense Refill   acetaminophen (TYLENOL) 650 MG CR tablet Take 650 mg by mouth every 8 (eight) hours as needed for pain.     albuterol (PROVENTIL HFA;VENTOLIN HFA) 108 (90 Base) MCG/ACT inhaler Inhale 2 puffs into the lungs every 6 (six) hours as needed. (Patient not taking: Reported on 08/11/2022) 1 Inhaler 1   colchicine 0.6 MG tablet Take 1 tablet (0.6 mg total) by mouth daily as needed. 90 tablet 1   dextromethorphan-guaiFENesin (MUCINEX DM) 30-600 MG 12hr tablet Take 1 tablet by mouth 2 (two) times daily. (Patient not taking: Reported on 02/10/2022) 20 tablet 1   levothyroxine (SYNTHROID) 112 MCG tablet Take 1 tablet (112 mcg total) by mouth daily. 90 tablet 3   omeprazole (PRILOSEC) 20 MG capsule TAKE ONE CAPSULE BY MOUTH DAILY 90 capsule 1   rosuvastatin (CRESTOR) 20 MG tablet Take 1 tablet (20 mg total) by mouth daily. (Patient taking differently: Take 20 mg by mouth every other day.) 90 tablet 1   valsartan-hydrochlorothiazide (DIOVAN-HCT) 80-12.5  MG tablet Take 1 tablet by mouth daily. 90 tablet 3   Current Facility-Administered Medications  Medication Dose Route Frequency Provider Last Rate Last Admin   0.9 %  sodium chloride infusion  500 mL Intravenous Once Mansouraty, Telford Nab., MD        Current Outpatient Medications:    acetaminophen (TYLENOL) 650 MG CR tablet, Take 650 mg by mouth every 8 (eight) hours as needed for pain., Disp: , Rfl:    albuterol (PROVENTIL HFA;VENTOLIN HFA) 108 (90 Base) MCG/ACT inhaler, Inhale 2 puffs into the lungs every 6 (six) hours as needed. (Patient not taking: Reported on 08/11/2022), Disp: 1 Inhaler, Rfl: 1   colchicine 0.6 MG tablet, Take 1 tablet (0.6 mg total) by mouth daily as needed., Disp: 90 tablet, Rfl: 1   dextromethorphan-guaiFENesin (MUCINEX DM) 30-600 MG 12hr tablet, Take 1 tablet by mouth 2 (two) times daily. (Patient not taking: Reported on 02/10/2022), Disp: 20 tablet, Rfl: 1   levothyroxine (SYNTHROID) 112 MCG tablet, Take 1 tablet (112 mcg total) by mouth daily., Disp: 90 tablet, Rfl: 3   omeprazole (PRILOSEC) 20 MG capsule, TAKE ONE CAPSULE BY MOUTH DAILY, Disp: 90 capsule, Rfl: 1   rosuvastatin (CRESTOR) 20 MG tablet, Take 1 tablet (20 mg total) by mouth daily. (Patient taking differently: Take 20 mg by mouth every other day.), Disp: 90 tablet, Rfl: 1   valsartan-hydrochlorothiazide (DIOVAN-HCT) 80-12.5  MG tablet, Take 1 tablet by mouth daily., Disp: 90 tablet, Rfl: 3  Current Facility-Administered Medications:    0.9 %  sodium chloride infusion, 500 mL, Intravenous, Once, Mansouraty, Telford Nab., MD Allergies  Allergen Reactions   Darvocet [Propoxyphene N-Acetaminophen] Nausea And Vomiting   Family History  Problem Relation Age of Onset   Heart disease Mother    Rheum arthritis Mother    Colon cancer Neg Hx    Esophageal cancer Neg Hx    Stomach cancer Neg Hx    Rectal cancer Neg Hx    Social History   Socioeconomic History   Marital status: Married    Spouse name:  Not on file   Number of children: Not on file   Years of education: Not on file   Highest education level: Not on file  Occupational History   Not on file  Tobacco Use   Smoking status: Every Day    Packs/day: 0.10    Years: 22.00    Total pack years: 2.20    Types: Cigarettes   Smokeless tobacco: Never   Tobacco comments:    3 cigs daiy  Vaping Use   Vaping Use: Never used  Substance and Sexual Activity   Alcohol use: No   Drug use: No   Sexual activity: Not on file  Other Topics Concern   Not on file  Social History Narrative   Not on file   Social Determinants of Health   Financial Resource Strain: Not on file  Food Insecurity: No Food Insecurity (05/29/2022)   Hunger Vital Sign    Worried About Running Out of Food in the Last Year: Never true    Ran Out of Food in the Last Year: Never true  Transportation Needs: No Transportation Needs (05/29/2022)   PRAPARE - Hydrologist (Medical): No    Lack of Transportation (Non-Medical): No  Physical Activity: Not on file  Stress: Not on file  Social Connections: Not on file  Intimate Partner Violence: Not on file    Physical Exam: Today's Vitals   09/08/22 1005 09/08/22 1013  BP: (!) 165/94   Pulse: 90   Temp:  98.9 F (37.2 C)  SpO2: 99%   Weight: 193 lb (87.5 kg)   Height: '5\' 6"'$  (1.676 m)    Body mass index is 31.15 kg/m. GEN: NAD EYE: Sclerae anicteric ENT: MMM CV: Non-tachycardic GI: Soft, NT/ND NEURO:  Alert & Oriented x 3  Lab Results: No results for input(s): "WBC", "HGB", "HCT", "PLT" in the last 72 hours. BMET No results for input(s): "NA", "K", "CL", "CO2", "GLUCOSE", "BUN", "CREATININE", "CALCIUM" in the last 72 hours. LFT No results for input(s): "PROT", "ALBUMIN", "AST", "ALT", "ALKPHOS", "BILITOT", "BILIDIR", "IBILI" in the last 72 hours. PT/INR No results for input(s): "LABPROT", "INR" in the last 72 hours.   Impression / Plan: This is a 66 y.o.female who  presents for Colonoscopy for screening.  The risks and benefits of endoscopic evaluation/treatment were discussed with the patient and/or family; these include but are not limited to the risk of perforation, infection, bleeding, missed lesions, lack of diagnosis, severe illness requiring hospitalization, as well as anesthesia and sedation related illnesses.  The patient's history has been reviewed, patient examined, no change in status, and deemed stable for procedure.  The patient and/or family is agreeable to proceed.    Justice Britain, MD [facility] Advanced Endoscopy Office # [phone]

## 2022-09-08 NOTE — Progress Notes (Signed)
Pt's states no medical or surgical changes since previsit or office visit. 

## 2022-09-08 NOTE — Op Note (Signed)
[place] Patient Name: Erika Cervantes Procedure Date: 09/08/2022 10:55 AM MRN: [id number] Endoscopist: Justice Britain , MD, [id number] Age: 66 Referring MD:  Date of Birth: 1956-01-22 Gender: Female Account #: [id number] Procedure:                Colonoscopy Indications:              Screening for colorectal malignant neoplasm Medicines:                Monitored Anesthesia Care Procedure:                Pre-Anesthesia Assessment:                           - Prior to the procedure, a History and Physical                            was performed, and patient medications and                            allergies were reviewed. The patient's tolerance of                            previous anesthesia was also reviewed. The risks                            and benefits of the procedure and the sedation                            options and risks were discussed with the patient.                            All questions were answered, and informed consent                            was obtained. Prior Anticoagulants: The patient has                            taken no anticoagulant or antiplatelet agents. ASA                            Grade Assessment: II - A patient with mild systemic                            disease. After reviewing the risks and benefits,                            the patient was deemed in satisfactory condition to                            undergo the procedure.                           After obtaining informed consent, the colonoscope  was passed under direct vision. Throughout the                            procedure, the patient's blood pressure, pulse, and                            oxygen saturations were monitored continuously. The                            CF [id number] #[id number] was introduced through the anus                            and advanced to the 5 cm into the ileum. The                            colonoscopy  was performed without difficulty. The                            patient tolerated the procedure. The quality of the                            bowel preparation was adequate. The terminal ileum,                            ileocecal valve, appendiceal orifice, and rectum                            were photographed. Scope In: 11:04:22 AM Scope Out: 11:39:12 AM Scope Withdrawal Time: 0 hours 32 minutes 32 seconds  Total Procedure Duration: 0 hours 34 minutes 50 seconds  Findings:                 The digital rectal exam findings include                            hemorrhoids. Pertinent negatives include no                            palpable rectal lesions.                           The terminal ileum and ileocecal valve appeared                            normal.                           A 19 mm polyp was found in the transverse colon.                            The polyp was semi-sessile. Preparations were made                            for attempt at mucosal resection. Demarcation of  the lesion was performed with high-definition white                            light and narrow band imaging to clearly identify                            the boundaries of the lesion. Saline was injected                            to raise the lesion. Snare mucosal resection was                            performed. Resection and retrieval were complete.                            Resected tissue margins were examined and clear of                            polyp tissue.                           Nine sessile polyps were found in the descending                            colon (4), transverse colon (4) and ascending colon                            (1). The polyps were 2 to 8 mm in size. These                            polyps were removed with a cold snare. Resection                            and retrieval were complete.                           Normal mucosa was found in  the entire colon                            otherwise.                           Non-bleeding non-thrombosed external and internal                            hemorrhoids were found during retroflexion, during                            perianal exam and during digital exam. The                            hemorrhoids were Grade II (internal hemorrhoids  that prolapse but reduce spontaneously). Complications:            No immediate complications. Estimated Blood Loss:     Estimated blood loss was minimal. Impression:               - Hemorrhoids found on digital rectal exam.                           - The examined portion of the ileum was normal.                           - One, 19 mm polyp in the transverse colon, removed                            with mucosal resection. Resected and retrieved.                            Treated with STSC to the margin.                           - Nine, 2 to 8 mm polyps in the descending colon,                            in the transverse colon and in the ascending colon,                            removed with a cold snare. Resected and retrieved.                           - Normal mucosa in the entire examined colon                            otherwise.                           - Non-bleeding non-thrombosed external and internal                            hemorrhoids. Recommendation:           - The patient will be observed post-procedure,                            until all discharge criteria are met.                           - Discharge patient to home.                           - Patient has a contact number available for                            emergencies. The signs and symptoms of potential                            delayed complications  were discussed with the                            patient. Return to normal activities tomorrow.                            Written discharge instructions were provided to the                             patient.                           - High fiber diet.                           - Use FiberCon 1-2 tablets PO daily.                           - Minimize NSAIDs use x1-2 weeks weeks to decrease                            risk of bleeding postintervention.                           - Continue present medications.                           - Monitor for signs/symptoms of bleeding,                            perforation, and infection. If issues please call                            our number to get further assistance as needed.                           - Await pathology results.                           - Repeat colonoscopy in 1 year for surveillance                            based on pathology results if all tissue returns as                            adenomatous otherwise 3-year follow-up will be                            recommended.                           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 She was recommended a  follow-up CT scan to ensure healing of the pancreas                            inflammation. Patient never had this performed.                            Thankfully she has not had any episodes of                            recurrent pancreatitis. We will plan to move                            forward with a CT abdomen with IV and oral contrast                            to be performed at her convenience.                           - The findings and recommendations were discussed                            with the patient.                           - The findings and recommendations were discussed                            with the patient's family. Justice Britain, MD 09/08/2022 11:47:28 AM

## 2022-09-09 ENCOUNTER — Telehealth: Payer: Self-pay | Admitting: *Deleted

## 2022-09-09 NOTE — Telephone Encounter (Signed)
Attempted f/u phone call. No answer. Left message. °

## 2022-09-11 ENCOUNTER — Encounter: Payer: Self-pay | Admitting: Gastroenterology

## 2022-09-11 ENCOUNTER — Other Ambulatory Visit: Payer: Self-pay | Admitting: Internal Medicine

## 2022-09-11 DIAGNOSIS — M79641 Pain in right hand: Secondary | ICD-10-CM

## 2022-09-11 DIAGNOSIS — M1990 Unspecified osteoarthritis, unspecified site: Secondary | ICD-10-CM

## 2022-09-11 MED ORDER — COLCHICINE 0.6 MG PO TABS: 0.6000 mg | ORAL_TABLET | Freq: Every day | ORAL | 1 refills | Status: AC | PRN

## 2022-09-11 NOTE — Telephone Encounter (Signed)
Patient called stating she has left multiple messages requesting a return call to discuss her x-ray results.  Patient states she had an appointment in October and her shoulders are still painful.  Patient states "no one has returned her call and is frustrated."    Patient requested a prescription refill of Colchicine to be sent to [place] [facility].

## 2022-09-11 NOTE — Telephone Encounter (Signed)
Left VM attempting to call back today.  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 The arthritis does not look very severe so there may be some inflammation or tendon irritation contributing to this. She can continue current medicines like colchicine or as needed Tylenol or turmeric supplement for shoulder pain.  She can use topical diclofenac but should continue to avoid oral NSAIDs due to GI and renal issues.  If the shoulder pain is not getting any better on its own we could try seeing her for a procedure visit with steroid injection or refer to a physical therapy evaluation.

## 2022-09-11 NOTE — Telephone Encounter (Signed)
Next Visit: 02/16/2023  Last Visit: 08/21/2022  Last Fill: 02/10/2022  DX:  Inflammatory arthritis   Current Dose per office note 08/18/2022: not discussed  Labs: 07/07/2022 Creat. 1.25, GFR 45.06, Hgb 11.7, Hct 35.6  Okay to refill Colchicine?

## 2022-09-14 NOTE — Telephone Encounter (Signed)
Attempted to contact patient at both number is the chart. Left message on work number unable to leave a message on mobile number, voicemail is full.

## 2022-09-15 NOTE — Telephone Encounter (Signed)
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 The arthritis does not look very severe so there may be some inflammation or tendon irritation contributing to this. She can continue current medicines like colchicine or as needed Tylenol or turmeric supplement for shoulder pain.  She can use topical diclofenac but should continue to avoid oral NSAIDs due to GI and renal issues.  If the shoulder pain is not getting any better on its own we could try seeing her for a procedure visit with steroid injection or refer to a physical therapy evaluation.  Patient states she has tried the topical gel and tylenol do not help. Patient states she is having increased joint pain. Patient states with the cold and the rain she is hurting more. Patient is concerned that a steroid injection won't last long.

## 2022-09-16 ENCOUNTER — Ambulatory Visit (INDEPENDENT_AMBULATORY_CARE_PROVIDER_SITE_OTHER): Payer: Medicare HMO | Admitting: Emergency Medicine

## 2022-09-16 ENCOUNTER — Ambulatory Visit: Payer: Medicare HMO | Admitting: *Deleted

## 2022-09-16 ENCOUNTER — Encounter: Payer: Self-pay | Admitting: Emergency Medicine

## 2022-09-16 VITALS — BP 130/70 | HR 67 | Temp 98.3°F | Ht 66.0 in | Wt 196.5 lb

## 2022-09-16 DIAGNOSIS — Z1231 Encounter for screening mammogram for malignant neoplasm of breast: Secondary | ICD-10-CM | POA: Diagnosis not present

## 2022-09-16 DIAGNOSIS — G8929 Other chronic pain: Secondary | ICD-10-CM | POA: Diagnosis not present

## 2022-09-16 DIAGNOSIS — E785 Hyperlipidemia, unspecified: Secondary | ICD-10-CM

## 2022-09-16 DIAGNOSIS — E039 Hypothyroidism, unspecified: Secondary | ICD-10-CM

## 2022-09-16 DIAGNOSIS — M159 Polyosteoarthritis, unspecified: Secondary | ICD-10-CM

## 2022-09-16 DIAGNOSIS — K219 Gastro-esophageal reflux disease without esophagitis: Secondary | ICD-10-CM

## 2022-09-16 DIAGNOSIS — I10 Essential (primary) hypertension: Secondary | ICD-10-CM

## 2022-09-16 DIAGNOSIS — R69 Illness, unspecified: Secondary | ICD-10-CM | POA: Diagnosis not present

## 2022-09-16 DIAGNOSIS — N1831 Chronic kidney disease, stage 3a: Secondary | ICD-10-CM | POA: Diagnosis not present

## 2022-09-16 DIAGNOSIS — M2550 Pain in unspecified joint: Secondary | ICD-10-CM | POA: Diagnosis not present

## 2022-09-16 DIAGNOSIS — F17200 Nicotine dependence, unspecified, uncomplicated: Secondary | ICD-10-CM

## 2022-09-16 MED ORDER — TRAMADOL HCL 50 MG PO TABS: 50.0000 mg | ORAL_TABLET | Freq: Three times a day (TID) | ORAL | 1 refills | Status: AC | PRN

## 2022-09-16 NOTE — Patient Instructions (Signed)
Joint Pain  Joint pain can be caused by many things. It is likely to go away if you follow instructions from your doctor for taking care of yourself at home. Sometimes, you may need more treatment. Follow these instructions at home: Managing pain, stiffness, and swelling     If told, put ice on the painful area. To do this: If you have a removable elastic bandage, sling, or splint, take it off as told by your doctor. Put ice in a plastic bag. Place a towel between your skin and the bag. Leave the ice on for 20 minutes, 2-3 times a day. Take off the ice if your skin turns bright red. This is very important. If you cannot feel pain, heat, or cold, you have a greater risk of damage to the area. Move your fingers or toes below the painful joint often. Raise the painful joint above the level of your heart while you are sitting or lying down. If told, put heat on the painful area. Do this as often as told by your doctor. Use the heat source that your doctor recommends, such as a moist heat pack or a heating pad. Place a towel between your skin and the heat source. Leave the heat on for 20-30 minutes. Take off the heat if your skin gets bright red. This is especially important if you are unable to feel pain, heat, or cold. You may have a greater risk of getting burned. Activity Rest the painful joint for as long as told by your doctor. Do not do things that cause pain or make your pain worse. Begin exercising or stretching the affected area, as told by your doctor. Ask your doctor what types of exercise are safe for you. Return to your normal activities when your doctor says that it is safe. If you have an elastic bandage, sling, or splint: Wear it as told by your doctor. Take it only as told by your doctor. Loosen it your fingers or toes below the joint: Tingle. Become numb. Get cold and blue. Keep it clean. Ask your doctor if you should take it off before bathing. If it is not  waterproof: Do not let it get wet. Cover it with a watertight covering when you take a bath or shower. General instructions Take over-the-counter and prescription medicines only as told by your doctor. This may include medicines taken by mouth or applied to the skin. Do not smoke or use any products that contain nicotine or tobacco. If you need help quitting, ask your doctor. Keep all follow-up visits as told by your doctor. This is important. Contact a doctor if: You have pain that gets worse and does not get better with medicine. Your joint pain does not get better in 3 days. You have more bruising or swelling. You have a fever. You lose 10 lb (4.5 kg) or more without trying. Get help right away if: You cannot move the joint. Your fingers or toes tingle, become numb. or get cold and blue. You have a fever along with a joint that is red, warm, and swollen. Summary Joint pain can be caused by many things. It often goes away if you follow instructions from your doctor for taking care of yourself at home. Rest the painful joint for as long as told. Do not do things that cause pain or make your pain worse. Take over-the-counter and prescription medicines only as told by your doctor. This information is not intended to replace advice given to you   by your health care provider. Make sure you discuss any questions you have with your health care provider. Document Revised: 01/24/2020 Document Reviewed: 01/24/2020 Elsevier Patient Education  2023 [employer]  

## 2022-09-16 NOTE — Assessment & Plan Note (Signed)
Stable.  Diet and nutrition discussed.  Continue rosuvastatin 20 mg daily. The 10-year ASCVD risk score (Arnett DK, et al., 2019) is: 20.2%   Values used to calculate the score:     Age: 66 years     Sex: Female     Is Non-Hispanic African American: Yes     Diabetic: No     Tobacco smoker: Yes     Systolic Blood Pressure: 550 mmHg     Is BP treated: Yes     HDL Cholesterol: 38.6 mg/dL     Total Cholesterol: 146 mg/dL

## 2022-09-16 NOTE — Assessment & Plan Note (Signed)
Clinically euthyroid. Continue levothyroxine 112 mcg daily.

## 2022-09-16 NOTE — Assessment & Plan Note (Signed)
Currently active and affecting quality of life. Chronic pain associated with this condition. Needs pain management clinic evaluation. Taking Tylenol for mild to moderate pain.  Recommend tramadol for moderate to severe pain. Recommend additional rheumatology evaluation for second opinion.

## 2022-09-16 NOTE — Progress Notes (Signed)
Erroneous encounter

## 2022-09-16 NOTE — Assessment & Plan Note (Signed)
Cancer and cardiovascular risks associated with smoking discussed.  Smoking cessation advice given. 

## 2022-09-16 NOTE — Assessment & Plan Note (Signed)
Well-controlled hypertension. Continue Diovan HCTZ 80-12.5 mg daily Cardiovascular risks associated with hypertension discussed Dietary approaches to stop hypertension discussed.

## 2022-09-16 NOTE — Assessment & Plan Note (Signed)
Active and affecting quality of life. Pain management clinic referral placed today. Advised to use Tylenol for mild to moderate pain and tramadol for moderate to severe pain.

## 2022-09-16 NOTE — Telephone Encounter (Signed)
Attempted to return call again gone to VM with full box. I recommend she see physical therapy if wanting a longer term treatment plan for shoulder pain. If she does not want to have an injection we could prescribe a short course of oral prednisone, but this would only be for at most 1 week and probably not last long term so would want her to see PT.

## 2022-09-16 NOTE — Assessment & Plan Note (Signed)
Advised to stay well-hydrated and avoid NSAIDs. ?

## 2022-09-16 NOTE — Progress Notes (Signed)
Erika Cervantes 66 y.o.   Chief Complaint  Patient presents with   Follow-up    F/u arm and shoulder pain, patient states she was referred to Rheumatology.     HISTORY OF PRESENT ILLNESS: This is a 66 y.o. female with history of osteoarthritis of multiple joints and chronic pain complaining of increased pain to shoulders, mostly left.  HPI   Prior to Admission medications   Medication Sig Start Date End Date Taking? Authorizing Provider  acetaminophen (TYLENOL) 650 MG CR tablet Take 650 mg by mouth every 8 (eight) hours as needed for pain.   Yes [provider]  colchicine 0.6 MG tablet Take 1 tablet (0.6 mg total) by mouth daily as needed. 09/11/22  Yes Rice, Resa Miner, MD  levothyroxine (SYNTHROID) 112 MCG tablet Take 1 tablet (112 mcg total) by mouth daily. 07/07/22  Yes Keelynn Furgerson, Ines Bloomer, MD  omeprazole (PRILOSEC) 20 MG capsule TAKE ONE CAPSULE BY MOUTH DAILY 07/10/22  Yes Niclas Markell, Ines Bloomer, MD  rosuvastatin (CRESTOR) 20 MG tablet Take 1 tablet (20 mg total) by mouth daily. Patient taking differently: Take 20 mg by mouth every other day. 01/06/22  Yes Fitzpatrick Alberico, Ines Bloomer, MD  valsartan-hydrochlorothiazide (DIOVAN-HCT) 80-12.5 MG tablet Take 1 tablet by mouth daily. 07/07/22  Yes Ciel Yanes, Ines Bloomer, MD  albuterol (PROVENTIL HFA;VENTOLIN HFA) 108 (90 Base) MCG/ACT inhaler Inhale 2 puffs into the lungs every 6 (six) hours as needed. Patient not taking: Reported on 08/11/2022 12/07/17   Jaynee Eagles, PA-C    Allergies  Allergen Reactions   Darvocet [Propoxyphene N-Acetaminophen] Nausea And Vomiting    Patient Active Problem List   Diagnosis Date Noted   Positive colorectal cancer screening using Cologuard test 07/07/2022   Stage 3a chronic kidney disease ([place]) 01/08/2022   Current smoker 01/06/2022   Bilateral hand pain 08/28/2021   Positive ANA (antinuclear antibody) 08/28/2021   Inflammatory arthritis 08/28/2021   Primary osteoarthritis involving multiple  joints 07/08/2021   Dyslipidemia 08/01/2019   Degenerative arthritis of thumb 09/04/2015   Vitamin D deficiency 05/26/2012   Hypothyroidism 02/26/2012   Hyperlipidemia with target LDL less than 100 02/26/2012   Essential hypertension 02/26/2012   Obesity (BMI 30.0-34.9) 02/26/2012   Bulging lumbar disc 02/26/2012   GERD (gastroesophageal reflux disease) 02/26/2012    Past Medical History:  Diagnosis Date   Allergy    Arthritis    GERD (gastroesophageal reflux disease)    High cholesterol    Hypertension    PONV (postoperative nausea and vomiting)    Thyroid disease     Past Surgical History:  Procedure Laterality Date   ABDOMINAL HYSTERECTOMY  late 1980s   Fibroids were indication.  pt says also had SPO bil.     APPENDECTOMY  late 1980s   done at time of hysto.     BACK SURGERY     BREAST SURGERY     Right breast mass   ESOPHAGOGASTRODUODENOSCOPY (EGD) WITH PROPOFOL N/A 11/08/2018   Procedure: ESOPHAGOGASTRODUODENOSCOPY (EGD) WITH PROPOFOL;  Surgeon: Milus Banister, MD;  Location: [facility] ENDOSCOPY;  Service: Endoscopy;  Laterality: N/A;   Hammer toes     TONSILLECTOMY      Social History   Socioeconomic History   Marital status: Married    Spouse name: Not on file   Number of children: Not on file   Years of education: Not on file   Highest education level: Not on file  Occupational History   Not on file  Tobacco Use  Smoking status: Every Day    Packs/day: 0.10    Years: 22.00    Total pack years: 2.20    Types: Cigarettes   Smokeless tobacco: Never   Tobacco comments:    3 cigs daiy  Vaping Use   Vaping Use: Never used  Substance and Sexual Activity   Alcohol use: No   Drug use: No   Sexual activity: Not on file  Other Topics Concern   Not on file  Social History Narrative   Not on file   Social Determinants of Health   Financial Resource Strain: Not on file  Food Insecurity: No Food Insecurity (05/29/2022)   Hunger Vital Sign    Worried About  Running Out of Food in the Last Year: Never true    Ran Out of Food in the Last Year: Never true  Transportation Needs: No Transportation Needs (05/29/2022)   PRAPARE - Hydrologist (Medical): No    Lack of Transportation (Non-Medical): No  Physical Activity: Not on file  Stress: Not on file  Social Connections: Not on file  Intimate Partner Violence: Not on file    Family History  Problem Relation Age of Onset   Heart disease Mother    Rheum arthritis Mother    Colon cancer Neg Hx    Esophageal cancer Neg Hx    Stomach cancer Neg Hx    Rectal cancer Neg Hx      Review of Systems  Constitutional: Negative.  Negative for chills and fever.  HENT: Negative.  Negative for congestion and sore throat.   Respiratory: Negative.  Negative for cough and shortness of breath.   Cardiovascular: Negative.  Negative for chest pain and palpitations.  Gastrointestinal:  Negative for abdominal pain, nausea and vomiting.  Musculoskeletal:  Positive for joint pain.  Skin: Negative.  Negative for rash.  Neurological: Negative.  Negative for dizziness and headaches.  All other systems reviewed and are negative.  Today's Vitals   09/16/22 1308  Weight: 196 lb 8 oz (89.1 kg)  Height: '5\' 6"'$  (1.676 m)   Body mass index is 31.72 kg/m.   Physical Exam Vitals reviewed.  Constitutional:      Appearance: Normal appearance.  HENT:     Head: Normocephalic.  Eyes:     Extraocular Movements: Extraocular movements intact.     Pupils: Pupils are equal, round, and reactive to light.  Cardiovascular:     Rate and Rhythm: Normal rate and regular rhythm.  Pulmonary:     Effort: Pulmonary effort is normal.     Breath sounds: Normal breath sounds.  Musculoskeletal:        General: Deformity (Osteoarthritic changes) present.  Skin:    General: Skin is warm and dry.  Neurological:     General: No focal deficit present.     Mental Status: She is alert and oriented to  person, place, and time.  Psychiatric:        Mood and Affect: Mood normal.        Behavior: Behavior normal.      ASSESSMENT & PLAN: A total of 50 minutes was spent with the patient and counseling/coordination of care regarding preparing for this visit, review of most recent office visit notes, review of multiple chronic medical problems and their management, review of all medications, pain management and need for evaluation at pain management center, education on nutrition, smoking cessation advised, prognosis, documentation, and need for follow-up.  Problem List Items Addressed  This Visit       Cardiovascular and Mediastinum   Essential hypertension    Well-controlled hypertension. Continue Diovan HCTZ 80-12.5 mg daily Cardiovascular risks associated with hypertension discussed Dietary approaches to stop hypertension discussed.        Digestive   GERD (gastroesophageal reflux disease)     Endocrine   Hypothyroidism    Clinically euthyroid. Continue levothyroxine 112 mcg daily.        Musculoskeletal and Integument   Primary osteoarthritis involving multiple joints - Primary    Currently active and affecting quality of life. Chronic pain associated with this condition. Needs pain management clinic evaluation. Taking Tylenol for mild to moderate pain.  Recommend tramadol for moderate to severe pain. Recommend additional rheumatology evaluation for second opinion.      Relevant Medications   traMADol (ULTRAM) 50 MG tablet   Other Relevant Orders   Ambulatory referral to Pain Clinic   Ambulatory referral to Rheumatology     Genitourinary   Stage 3a chronic kidney disease ([place])    Advised to stay well-hydrated and avoid NSAIDs        Other   Dyslipidemia    Stable.  Diet and nutrition discussed.  Continue rosuvastatin 20 mg daily. The 10-year ASCVD risk score (Arnett DK, et al., 2019) is: 20.2%   Values used to calculate the score:     Age: 42 years     Sex:  Female     Is Non-Hispanic African American: Yes     Diabetic: No     Tobacco smoker: Yes     Systolic Blood Pressure: 564 mmHg     Is BP treated: Yes     HDL Cholesterol: 38.6 mg/dL     Total Cholesterol: 146 mg/dL       Current smoker    Cancer and cardiovascular risks associated with smoking discussed.  Smoking cessation advice given.      Chronic joint pain    Active and affecting quality of life. Pain management clinic referral placed today. Advised to use Tylenol for mild to moderate pain and tramadol for moderate to severe pain.      Relevant Medications   traMADol (ULTRAM) 50 MG tablet   Other Relevant Orders   Ambulatory referral to Pain Clinic   Other Visit Diagnoses     Breast cancer screening by mammogram       Relevant Orders   MM Digital Screening      Patient Instructions  Joint Pain  Joint pain can be caused by many things. It is likely to go away if you follow instructions from your doctor for taking care of yourself at home. Sometimes, you may need more treatment. Follow these instructions at home: Managing pain, stiffness, and swelling     If told, put ice on the painful area. To do this: If you have a removable elastic bandage, sling, or splint, take it off as told by your doctor. Put ice in a plastic bag. Place a towel between your skin and the bag. Leave the ice on for 20 minutes, 2-3 times a day. Take off the ice if your skin turns bright red. This is very important. If you cannot feel pain, heat, or cold, you have a greater risk of damage to the area. Move your fingers or toes below the painful joint often. Raise the painful joint above the level of your heart while you are sitting or lying down. If told, put heat on the painful area. Do  this as often as told by your doctor. Use the heat source that your doctor recommends, such as a moist heat pack or a heating pad. Place a towel between your skin and the heat source. Leave the heat on for  20-30 minutes. Take off the heat if your skin gets bright red. This is especially important if you are unable to feel pain, heat, or cold. You may have a greater risk of getting burned. Activity Rest the painful joint for as long as told by your doctor. Do not do things that cause pain or make your pain worse. Begin exercising or stretching the affected area, as told by your doctor. Ask your doctor what types of exercise are safe for you. Return to your normal activities when your doctor says that it is safe. If you have an elastic bandage, sling, or splint: Wear it as told by your doctor. Take it only as told by your doctor. Loosen it your fingers or toes below the joint: Tingle. Become numb. Get cold and blue. Keep it clean. Ask your doctor if you should take it off before bathing. If it is not waterproof: Do not let it get wet. Cover it with a watertight covering when you take a bath or shower. General instructions Take over-the-counter and prescription medicines only as told by your doctor. This may include medicines taken by mouth or applied to the skin. Do not smoke or use any products that contain nicotine or tobacco. If you need help quitting, ask your doctor. Keep all follow-up visits as told by your doctor. This is important. Contact a doctor if: You have pain that gets worse and does not get better with medicine. Your joint pain does not get better in 3 days. You have more bruising or swelling. You have a fever. You lose 10 lb (4.5 kg) or more without trying. Get help right away if: You cannot move the joint. Your fingers or toes tingle, become numb. or get cold and blue. You have a fever along with a joint that is red, warm, and swollen. Summary Joint pain can be caused by many things. It often goes away if you follow instructions from your doctor for taking care of yourself at home. Rest the painful joint for as long as told. Do not do things that cause pain or make your  pain worse. Take over-the-counter and prescription medicines only as told by your doctor. This information is not intended to replace advice given to you by your health care provider. Make sure you discuss any questions you have with your health care provider. Document Revised: 01/24/2020 Document Reviewed: 01/24/2020 Elsevier Patient Education  [place], MD [facility]

## 2022-09-22 NOTE — Telephone Encounter (Signed)
Attempted to contact the patient. Unable to leave a message, mailbox is full.

## 2022-09-23 NOTE — Telephone Encounter (Signed)
Attempted to contact the patient. Unable to leave a message, mailbox is full.

## 2022-10-02 ENCOUNTER — Ambulatory Visit (HOSPITAL_COMMUNITY)
Admission: RE | Admit: 2022-10-02 | Discharge: 2022-10-02 | Disposition: A | Discharge status: Home / Self Care | Payer: Medicare HMO | Source: Ambulatory Visit | Attending: Gastroenterology | Admitting: Gastroenterology

## 2022-10-02 DIAGNOSIS — K8500 Idiopathic acute pancreatitis without necrosis or infection: Secondary | ICD-10-CM | POA: Insufficient documentation

## 2022-10-02 DIAGNOSIS — I700 Atherosclerosis of aorta: Secondary | ICD-10-CM | POA: Diagnosis not present

## 2022-10-06 DIAGNOSIS — M79672 Pain in left foot: Secondary | ICD-10-CM | POA: Diagnosis not present

## 2022-10-06 DIAGNOSIS — M79641 Pain in right hand: Secondary | ICD-10-CM | POA: Diagnosis not present

## 2022-10-06 DIAGNOSIS — M1990 Unspecified osteoarthritis, unspecified site: Secondary | ICD-10-CM | POA: Diagnosis not present

## 2022-10-06 DIAGNOSIS — M25571 Pain in right ankle and joints of right foot: Secondary | ICD-10-CM | POA: Diagnosis not present

## 2022-10-06 DIAGNOSIS — M79642 Pain in left hand: Secondary | ICD-10-CM | POA: Diagnosis not present

## 2022-10-06 DIAGNOSIS — M79671 Pain in right foot: Secondary | ICD-10-CM | POA: Diagnosis not present

## 2022-10-06 DIAGNOSIS — M25572 Pain in left ankle and joints of left foot: Secondary | ICD-10-CM | POA: Diagnosis not present

## 2022-11-02 DIAGNOSIS — M1990 Unspecified osteoarthritis, unspecified site: Secondary | ICD-10-CM | POA: Diagnosis not present

## 2022-11-02 DIAGNOSIS — M25511 Pain in right shoulder: Secondary | ICD-10-CM | POA: Diagnosis not present

## 2022-11-02 DIAGNOSIS — Z1159 Encounter for screening for other viral diseases: Secondary | ICD-10-CM | POA: Diagnosis not present

## 2022-11-02 DIAGNOSIS — Z79899 Other long term (current) drug therapy: Secondary | ICD-10-CM | POA: Diagnosis not present

## 2022-11-02 DIAGNOSIS — M0579 Rheumatoid arthritis with rheumatoid factor of multiple sites without organ or systems involvement: Secondary | ICD-10-CM | POA: Diagnosis not present

## 2022-11-27 ENCOUNTER — Ambulatory Visit
Admission: RE | Admit: 2022-11-27 | Discharge: 2022-11-27 | Disposition: A | Discharge status: Home / Self Care | Payer: Medicare HMO | Source: Ambulatory Visit | Attending: Emergency Medicine | Admitting: Emergency Medicine

## 2022-11-27 DIAGNOSIS — Z1231 Encounter for screening mammogram for malignant neoplasm of breast: Secondary | ICD-10-CM

## 2022-12-03 DIAGNOSIS — B338 Other specified viral diseases: Secondary | ICD-10-CM | POA: Diagnosis not present

## 2022-12-25 DIAGNOSIS — M0579 Rheumatoid arthritis with rheumatoid factor of multiple sites without organ or systems involvement: Secondary | ICD-10-CM | POA: Diagnosis not present

## 2022-12-25 DIAGNOSIS — Z79899 Other long term (current) drug therapy: Secondary | ICD-10-CM | POA: Diagnosis not present

## 2022-12-25 DIAGNOSIS — M25559 Pain in unspecified hip: Secondary | ICD-10-CM | POA: Diagnosis not present

## 2022-12-25 DIAGNOSIS — M25511 Pain in right shoulder: Secondary | ICD-10-CM | POA: Diagnosis not present

## 2022-12-25 DIAGNOSIS — M1990 Unspecified osteoarthritis, unspecified site: Secondary | ICD-10-CM | POA: Diagnosis not present

## 2022-12-25 DIAGNOSIS — M25551 Pain in right hip: Secondary | ICD-10-CM | POA: Diagnosis not present

## 2023-01-04 ENCOUNTER — Telehealth: Payer: Self-pay

## 2023-01-04 NOTE — Telephone Encounter (Signed)
Contacted Erika Cervantes to schedule their annual wellness visit. Appointment made for 01/12/23.  Norton Blizzard, [place] ([facility])  [facility] [phone]

## 2023-01-05 ENCOUNTER — Encounter: Payer: Self-pay | Admitting: Emergency Medicine

## 2023-01-05 ENCOUNTER — Ambulatory Visit (INDEPENDENT_AMBULATORY_CARE_PROVIDER_SITE_OTHER): Payer: Medicare HMO | Admitting: Emergency Medicine

## 2023-01-05 VITALS — BP 138/76 | HR 95 | Temp 98.4°F | Ht 66.0 in | Wt 197.4 lb

## 2023-01-05 DIAGNOSIS — R051 Acute cough: Secondary | ICD-10-CM | POA: Diagnosis not present

## 2023-01-05 DIAGNOSIS — E785 Hyperlipidemia, unspecified: Secondary | ICD-10-CM

## 2023-01-05 DIAGNOSIS — M25559 Pain in unspecified hip: Secondary | ICD-10-CM | POA: Diagnosis not present

## 2023-01-05 DIAGNOSIS — I10 Essential (primary) hypertension: Secondary | ICD-10-CM | POA: Diagnosis not present

## 2023-01-05 DIAGNOSIS — M159 Polyosteoarthritis, unspecified: Secondary | ICD-10-CM

## 2023-01-05 DIAGNOSIS — J22 Unspecified acute lower respiratory infection: Secondary | ICD-10-CM

## 2023-01-05 DIAGNOSIS — E039 Hypothyroidism, unspecified: Secondary | ICD-10-CM | POA: Diagnosis not present

## 2023-01-05 DIAGNOSIS — K219 Gastro-esophageal reflux disease without esophagitis: Secondary | ICD-10-CM | POA: Diagnosis not present

## 2023-01-05 DIAGNOSIS — Z79899 Other long term (current) drug therapy: Secondary | ICD-10-CM | POA: Diagnosis not present

## 2023-01-05 DIAGNOSIS — M1990 Unspecified osteoarthritis, unspecified site: Secondary | ICD-10-CM | POA: Diagnosis not present

## 2023-01-05 DIAGNOSIS — M0579 Rheumatoid arthritis with rheumatoid factor of multiple sites without organ or systems involvement: Secondary | ICD-10-CM | POA: Diagnosis not present

## 2023-01-05 DIAGNOSIS — M25511 Pain in right shoulder: Secondary | ICD-10-CM | POA: Diagnosis not present

## 2023-01-05 DIAGNOSIS — N1831 Chronic kidney disease, stage 3a: Secondary | ICD-10-CM

## 2023-01-05 LAB — COMPREHENSIVE METABOLIC PANEL
ALT: 12 U/L (ref 0–35)
AST: 15 U/L (ref 0–37)
Albumin: 4.1 g/dL (ref 3.5–5.2)
Alkaline Phosphatase: 46 U/L (ref 39–117)
BUN: 22 mg/dL (ref 6–23)
CO2: 24 mEq/L (ref 19–32)
Calcium: 10.1 mg/dL (ref 8.4–10.5)
Chloride: 104 mEq/L (ref 96–112)
Creatinine, Ser: 1.31 mg/dL — ABNORMAL HIGH (ref 0.40–1.20)
GFR: 42.45 mL/min — ABNORMAL LOW (ref >60.00)
Glucose, Bld: 87 mg/dL (ref 70–99)
Potassium: 3.7 mEq/L (ref 3.5–5.1)
Sodium: 138 mEq/L (ref 135–145)
Total Bilirubin: 0.4 mg/dL (ref 0.2–1.2)
Total Protein: 7.4 g/dL (ref 6.0–8.3)

## 2023-01-05 LAB — CBC WITH DIFFERENTIAL/PLATELET
Basophils Absolute: 0.1 10*3/uL (ref 0.0–0.1)
Basophils Relative: 1.7 % (ref 0.0–3.0)
Eosinophils Absolute: 0.4 10*3/uL (ref 0.0–0.7)
Eosinophils Relative: 4.8 % (ref 0.0–5.0)
HCT: 38 % (ref 36.0–46.0)
Hemoglobin: 12.5 g/dL (ref 12.0–15.0)
Lymphocytes Relative: 21.7 % (ref 12.0–46.0)
Lymphs Abs: 1.7 10*3/uL (ref 0.7–4.0)
MCHC: 33 g/dL (ref 30.0–36.0)
MCV: 83.9 fl (ref 78.0–100.0)
Monocytes Absolute: 1 10*3/uL (ref 0.1–1.0)
Monocytes Relative: 12.5 % — ABNORMAL HIGH (ref 3.0–12.0)
Neutro Abs: 4.7 10*3/uL (ref 1.4–7.7)
Neutrophils Relative %: 59.3 % (ref 43.0–77.0)
Platelets: 307 10*3/uL (ref 150.0–400.0)
RBC: 4.53 Mil/uL (ref 3.87–5.11)
RDW: 18.4 % — ABNORMAL HIGH (ref 11.5–15.5)
WBC: 8 10*3/uL (ref 4.0–10.5)

## 2023-01-05 LAB — HEMOGLOBIN A1C: Hgb A1c MFr Bld: 6.3 % (ref 4.6–6.5)

## 2023-01-05 LAB — LIPID PANEL
Cholesterol: 211 mg/dL — ABNORMAL HIGH (ref 0–200)
HDL: 46.4 mg/dL (ref >39.00)
LDL Cholesterol: 135 mg/dL — ABNORMAL HIGH (ref 0–99)
NonHDL: 164.8
Total CHOL/HDL Ratio: 5
Triglycerides: 148 mg/dL (ref 0.0–149.0)
VLDL: 29.6 mg/dL (ref 0.0–40.0)

## 2023-01-05 LAB — TSH: TSH: 5.08 u[IU]/mL (ref 0.35–5.50)

## 2023-01-05 MED ORDER — ROSUVASTATIN CALCIUM 20 MG PO TABS: 20.0000 mg | ORAL_TABLET | Freq: Every day | ORAL | 1 refills | Status: AC

## 2023-01-05 MED ORDER — HYDROCODONE BIT-HOMATROP MBR 5-1.5 MG/5ML PO SOLN: 5.0000 mL | Freq: Every evening | ORAL | 0 refills | Status: DC | PRN

## 2023-01-05 MED ORDER — AZITHROMYCIN 250 MG PO TABS: ORAL_TABLET | ORAL | 0 refills | Status: DC

## 2023-01-05 NOTE — Assessment & Plan Note (Signed)
Chronic stable condition.  Diet and nutrition discussed.  Continue rosuvastatin 20 mg daily. The 10-year ASCVD risk score (Arnett DK, et al., 2019) is: 17.4%   Values used to calculate the score:     Age: 67 years     Sex: Female     Is Non-Hispanic African American: Yes     Diabetic: No     Tobacco smoker: Yes     Systolic Blood Pressure: 0000000 mmHg     Is BP treated: Yes     HDL Cholesterol: 38.6 mg/dL     Total Cholesterol: 146 mg/dL

## 2023-01-05 NOTE — Assessment & Plan Note (Signed)
Stable and well-controlled.  Still takes omeprazole 20 mg daily as needed

## 2023-01-05 NOTE — Assessment & Plan Note (Signed)
Sees rheumatologist on a regular basis. Also told she has rheumatoid arthritis Was started on methotrexate 15 mg weekly with good response

## 2023-01-05 NOTE — Assessment & Plan Note (Signed)
Well-controlled hypertension. Continue Diovan HCT 80-12.5 mg daily. Cardiovascular risk associated with hypertension discussed Dietary approaches to stop hypertension discussed. 

## 2023-01-05 NOTE — Assessment & Plan Note (Signed)
Upper respiratory infection now with secondary bacterial infection.  Progressively getting worse. May benefit from daily azithromycin for 5 days. No clinical signs of pneumonia yet.

## 2023-01-05 NOTE — Assessment & Plan Note (Signed)
Cough management discussed Advised to continue over-the-counter Mucinex DM and cough drops Advised to stay well-hydrated May take Hycodan syrup as needed

## 2023-01-05 NOTE — Assessment & Plan Note (Signed)
Advised to stay well-hydrated and avoid NSAIDs. ?

## 2023-01-05 NOTE — Patient Instructions (Signed)
Health Maintenance After Age 67 After age 67, you are at a higher risk for certain long-term diseases and infections as well as injuries from falls. Falls are a major cause of broken bones and head injuries in people who are older than age 67. Getting regular preventive care can help to keep you healthy and well. Preventive care includes getting regular testing and making lifestyle changes as recommended by your health care provider. Talk with your health care provider about: Which screenings and tests you should have. A screening is a test that checks for a disease when you have no symptoms. A diet and exercise plan that is right for you. What should I know about screenings and tests to prevent falls? Screening and testing are the best ways to find a health problem early. Early diagnosis and treatment give you the best chance of managing medical conditions that are common after age 67. Certain conditions and lifestyle choices may make you more likely to have a fall. Your health care provider may recommend: Regular vision checks. Poor vision and conditions such as cataracts can make you more likely to have a fall. If you wear glasses, make sure to get your prescription updated if your vision changes. Medicine review. Work with your health care provider to regularly review all of the medicines you are taking, including over-the-counter medicines. Ask your health care provider about any side effects that may make you more likely to have a fall. Tell your health care provider if any medicines that you take make you feel dizzy or sleepy. Strength and balance checks. Your health care provider may recommend certain tests to check your strength and balance while standing, walking, or changing positions. Foot health exam. Foot pain and numbness, as well as not wearing proper footwear, can make you more likely to have a fall. Screenings, including: Osteoporosis screening. Osteoporosis is a condition that causes  the bones to get weaker and break more easily. Blood pressure screening. Blood pressure changes and medicines to control blood pressure can make you feel dizzy. Depression screening. You may be more likely to have a fall if you have a fear of falling, feel depressed, or feel unable to do activities that you used to do. Alcohol use screening. Using too much alcohol can affect your balance and may make you more likely to have a fall. Follow these instructions at home: Lifestyle Do not drink alcohol if: Your health care provider tells you not to drink. If you drink alcohol: Limit how much you have to: 0-1 drink a day for women. 0-2 drinks a day for men. Know how much alcohol is in your drink. In the [place] one drink equals one 12 oz bottle of beer (355 mL), one 5 oz glass of wine (148 mL), or one 1 oz glass of hard liquor (44 mL). Do not use any products that contain nicotine or tobacco. These products include cigarettes, chewing tobacco, and vaping devices, such as e-cigarettes. If you need help quitting, ask your health care provider. Activity  Follow a regular exercise program to stay fit. This will help you maintain your balance. Ask your health care provider what types of exercise are appropriate for you. If you need a cane or walker, use it as recommended by your health care provider. Wear supportive shoes that have nonskid soles. Safety  Remove any tripping hazards, such as rugs, cords, and clutter. Install safety equipment such as grab bars in bathrooms and safety rails on stairs. Keep rooms and walkways   well-lit. General instructions Talk with your health care provider about your risks for falling. Tell your health care provider if: You fall. Be sure to tell your health care provider about all falls, even ones that seem minor. You feel dizzy, tiredness (fatigue), or off-balance. Take over-the-counter and prescription medicines only as told by your health care provider. These include  supplements. Eat a healthy diet and maintain a healthy weight. A healthy diet includes low-fat dairy products, low-fat (lean) meats, and fiber from whole grains, beans, and lots of fruits and vegetables. Stay current with your vaccines. Schedule regular health, dental, and eye exams. Summary Having a healthy lifestyle and getting preventive care can help to protect your health and wellness after age 67. Screening and testing are the best way to find a health problem early and help you avoid having a fall. Early diagnosis and treatment give you the best chance for managing medical conditions that are more common for people who are older than age 67. Falls are a major cause of broken bones and head injuries in people who are older than age 67. Take precautions to prevent a fall at home. Work with your health care provider to learn what changes you can make to improve your health and wellness and to prevent falls. This information is not intended to replace advice given to you by your health care provider. Make sure you discuss any questions you have with your health care provider. Document Revised: 03/03/2021 Document Reviewed: 03/03/2021 Elsevier Patient Education  2023 [employer]  

## 2023-01-05 NOTE — Progress Notes (Signed)
Erika Cervantes 67 y.o.   Chief Complaint  Patient presents with   Follow-up    45mth f/u appt, patient having cold like symptoms, nasal and chest congestion, cough x 3days     HISTORY OF PRESENT ILLNESS: This is a 67y.o. female complaining of flulike symptoms that started 5 days ago. Mostly complaining of chest congestion and productive cough.  Progressively getting worse. Denies difficulty breathing, fever or chills. No other associated symptoms No other complaints or medical concerns today. Also here for 667-monthollow-up of chronic medical conditions. History of rheumatoid arthritis.  Sees rheumatologist on a regular basis.  Was started on methotrexate weekly.  Making progress.  HPI   Prior to Admission medications   Medication Sig Start Date End Date Taking? Authorizing Provider  acetaminophen (TYLENOL) 650 MG CR tablet Take 650 mg by mouth every 8 (eight) hours as needed for pain.   Yes [provider]  colchicine 0.6 MG tablet Take 1 tablet (0.6 mg total) by mouth daily as needed. 09/11/22  Yes Rice, ChResa MinerMD  levothyroxine (SYNTHROID) 112 MCG tablet Take 1 tablet (112 mcg total) by mouth daily. 07/07/22  Yes SaNathalieMiInes BloomerMD  methotrexate (RHEUMATREX) 2.5 MG tablet SMARTSIG:7 Tablet(s) By Mouth Once a Week 12/09/22  Yes [provider]  omeprazole (PRILOSEC) 20 MG capsule TAKE ONE CAPSULE BY MOUTH DAILY 07/10/22  Yes Carston Riedl, MiInes BloomerMD  rosuvastatin (CRESTOR) 20 MG tablet Take 1 tablet (20 mg total) by mouth daily. Patient taking differently: Take 20 mg by mouth every other day. 01/06/22  Yes Edsel Shives, MiInes BloomerMD  valsartan-hydrochlorothiazide (DIOVAN-HCT) 80-12.5 MG tablet Take 1 tablet by mouth daily. 07/07/22  Yes Sunnie Odden, MiInes BloomerMD  albuterol (PROVENTIL HFA;VENTOLIN HFA) 108 (90 Base) MCG/ACT inhaler Inhale 2 puffs into the lungs every 6 (six) hours as needed. 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   Primary osteoarthritis involving multiple joints 07/08/2021   Dyslipidemia 08/01/2019   Degenerative arthritis of thumb 09/04/2015   Vitamin D deficiency 05/26/2012   Hypothyroidism 02/26/2012   Hyperlipidemia with target LDL less than 100 02/26/2012   Essential hypertension 02/26/2012   Obesity (BMI 30.0-34.9) 02/26/2012   Bulging lumbar disc 02/26/2012   GERD (gastroesophageal reflux disease) 02/26/2012    Past Medical History:  Diagnosis Date   Allergy    Arthritis    GERD (gastroesophageal reflux disease)    High cholesterol    Hypertension    PONV (postoperative nausea and vomiting)    Thyroid disease     Past Surgical History:  Procedure Laterality Date   ABDOMINAL HYSTERECTOMY  late 1980s   Fibroids were indication.  pt says also had SPO bil.     APPENDECTOMY  late 1980s   done at time of hysto.     BACK SURGERY     BREAST SURGERY     Right breast mass   ESOPHAGOGASTRODUODENOSCOPY (EGD) WITH PROPOFOL N/A 11/08/2018   Procedure: ESOPHAGOGASTRODUODENOSCOPY (EGD) WITH PROPOFOL;  Surgeon: JaMilus BanisterMD;  Location:  [facility];  Service: Endoscopy;  Laterality: N/A;   Hammer toes     TONSILLECTOMY      Social History   Socioeconomic History   Marital status: Married    Spouse name: Not on file   Number of children: Not on file   Years of education: Not on file   Highest education level: Not on file  Occupational History   Not on file  Tobacco Use   Smoking status: Every Day    Packs/day: 0.10     Years: 22.00    Total pack years: 2.20    Types: Cigarettes   Smokeless tobacco: Never   Tobacco comments:    3 cigs daiy  Vaping Use   Vaping Use: Never used  Substance and Sexual Activity   Alcohol use: No   Drug use: No   Sexual activity: Not on file  Other Topics Concern   Not on file  Social History Narrative   Not on file   Social Determinants of Health   Financial Resource Strain: Not on file  Food Insecurity: No Food Insecurity (05/29/2022)   Hunger Vital Sign    Worried About Running Out of Food in the Last Year: Never true    Ran Out of Food in the Last Year: Never true  Transportation Needs: No Transportation Needs (05/29/2022)   PRAPARE - Hydrologist (Medical): No    Lack of Transportation (Non-Medical): No  Physical Activity: Not on file  Stress: Not on file  Social Connections: Not on file  Intimate Partner Violence: Not on file    Family History  Problem Relation Age of Onset   Heart disease Mother    Rheum arthritis Mother    Colon cancer Neg Hx    Esophageal cancer Neg Hx    Stomach cancer Neg Hx    Rectal cancer Neg Hx      Review of Systems  Constitutional:  Negative for chills, fever, malaise/fatigue and weight loss.  HENT:  Positive for congestion. Negative for ear pain, hearing loss, nosebleeds and sinus pain.   Eyes:  Negative for blurred vision, double vision, pain, discharge and redness.  Respiratory:  Positive for cough. Negative for hemoptysis, shortness of breath and wheezing.   Cardiovascular:  Negative for chest pain, palpitations, orthopnea, leg swelling and PND.  Gastrointestinal:  Negative for abdominal pain, blood in stool, constipation, diarrhea, heartburn, melena, nausea and vomiting.  Genitourinary:  Negative for dysuria, flank pain, frequency, hematuria and urgency.  Musculoskeletal:  Negative for back pain, joint pain, myalgias and neck pain.  Skin:  Negative for rash.  Neurological:  Negative  for dizziness, sensory change, speech change, focal weakness, seizures, loss of consciousness, weakness and headaches.  Endo/Heme/Allergies:  Negative for environmental allergies and polydipsia. Does not bruise/bleed easily.  Psychiatric/Behavioral:  Negative for depression, memory loss, substance abuse and suicidal ideas. The patient is not nervous/anxious and does not have insomnia.      Physical Exam Constitutional:      General: She is not in acute distress.    Appearance: Normal appearance. She is well-developed.     Comments: BP 138/76   Pulse 95   Temp 98.4 F (36.9 C) (Oral)   Ht '5\' 6"'$  (1.676 m)   Wt 197 lb 6 oz (89.5 kg)   SpO2 98%   BMI 31.86 kg/m    HENT:     Head: Normocephalic and atraumatic.     Right Ear: Hearing, tympanic membrane  and ear canal normal.     Left Ear: Hearing, tympanic membrane and ear canal normal.     Nose: Nose normal.     Mouth/Throat:     Pharynx: Uvula midline.  Eyes:     General: Lids are normal.     Extraocular Movements: Extraocular movements intact.     Conjunctiva/sclera: Conjunctivae normal.     Pupils: Pupils are equal, round, and reactive to light.  Neck:     Thyroid: No thyroid mass or thyromegaly.     Vascular: Normal carotid pulses. No carotid bruit or JVD.     Trachea: Trachea normal. No tracheal deviation.  Cardiovascular:     Rate and Rhythm: Normal rate and regular rhythm.     Heart sounds: Normal heart sounds.  Pulmonary:     Effort: Pulmonary effort is normal.     Breath sounds: Normal breath sounds.  Abdominal:     General: Bowel sounds are normal.     Palpations: Abdomen is soft.     Tenderness: There is no abdominal tenderness. There is no guarding or rebound.  Musculoskeletal:        General: Normal range of motion.     Cervical back: Normal range of motion and neck supple.  Lymphadenopathy:     Cervical: No cervical adenopathy.  Skin:    General: Skin is warm and dry.     Capillary Refill: Capillary refill  takes less than 2 seconds.  Neurological:     Mental Status: She is alert and oriented to person, place, and time.     Sensory: No sensory deficit.  Psychiatric:        Speech: Speech normal. Speech is not slurred.        Behavior: Behavior normal. Behavior is cooperative.      ASSESSMENT & PLAN: A total of 47 minutes was spent with the patient and counseling/coordination of care regarding preparing for this visit, review of most recent office visit notes, review of multiple chronic medical conditions under management, review of all medications, review of most recent blood work results, diagnosis of lower respiratory infection and need for antibiotics, cough management, education on nutrition, cardiovascular risks associated with hypertension and dyslipidemia, prognosis, documentation, and need for follow-up.  Problem List Items Addressed This Visit       Cardiovascular and Mediastinum   Essential hypertension - Primary    Well-controlled hypertension. Continue Diovan HCT 80-12.5 mg daily Cardiovascular risk associated with hypertension discussed Dietary approaches to stop hypertension discussed      Relevant Medications   rosuvastatin (CRESTOR) 20 MG tablet   Other Relevant Orders   Comprehensive metabolic panel   Hemoglobin A1c     Respiratory   Lower respiratory infection    Upper respiratory infection now with secondary bacterial infection.  Progressively getting worse. May benefit from daily azithromycin for 5 days. No clinical signs of pneumonia yet.      Relevant Medications   azithromycin (ZITHROMAX) 250 MG tablet   Other Relevant Orders   CBC with Differential/Platelet     Digestive   GERD (gastroesophageal reflux disease)    Stable and well-controlled.  Still takes omeprazole 20 mg daily as needed        Endocrine   Hypothyroidism    Clinically euthyroid.  Continues Synthroid 112 mcg daily.      Relevant Orders   TSH     Musculoskeletal and  Integument   Primary osteoarthritis involving multiple joints    Sees rheumatologist on  a regular basis. Also told she has rheumatoid arthritis Was started on methotrexate 15 mg weekly with good response      Relevant Medications   methotrexate (RHEUMATREX) 2.5 MG tablet     Genitourinary   Stage 3a chronic kidney disease (HCC)    Advised to stay well-hydrated and avoid NSAIDs        Other   Dyslipidemia    Chronic stable condition.  Diet and nutrition discussed.  Continue rosuvastatin 20 mg daily. The 10-year ASCVD risk score (Arnett DK, et al., 2019) is: 17.4%   Values used to calculate the score:     Age: 4 years     Sex: Female     Is Non-Hispanic African American: Yes     Diabetic: No     Tobacco smoker: Yes     Systolic Blood Pressure: 0000000 mmHg     Is BP treated: Yes     HDL Cholesterol: 38.6 mg/dL     Total Cholesterol: 146 mg/dL       Relevant Medications   rosuvastatin (CRESTOR) 20 MG tablet   Other Relevant Orders   Lipid panel   Hemoglobin A1c   Acute cough    Cough management discussed Advised to continue over-the-counter Mucinex DM and cough drops Advised to stay well-hydrated May take Hycodan syrup as needed      Relevant Medications   HYDROcodone bit-homatropine (HYCODAN) 5-1.5 MG/5ML syrup   Patient Instructions  Health Maintenance After Age 59 After age 52, you are at a higher risk for certain long-term diseases and infections as well as injuries from falls. Falls are a major cause of broken bones and head injuries in people who are older than age 89. Getting regular preventive care can help to keep you healthy and well. Preventive care includes getting regular testing and making lifestyle changes as recommended by your health care provider. Talk with your health care provider about: Which screenings and tests you should have. A screening is a test that checks for a disease when you have no symptoms. A diet and exercise plan that is right for  you. What should I know about screenings and tests to prevent falls? Screening and testing are the best ways to find a health problem early. Early diagnosis and treatment give you the best chance of managing medical conditions that are common after age 76. Certain conditions and lifestyle choices may make you more likely to have a fall. Your health care provider may recommend: Regular vision checks. Poor vision and conditions such as cataracts can make you more likely to have a fall. If you wear glasses, make sure to get your prescription updated if your vision changes. Medicine review. Work with your health care provider to regularly review all of the medicines you are taking, including over-the-counter medicines. Ask your health care provider about any side effects that may make you more likely to have a fall. Tell your health care provider if any medicines that you take make you feel dizzy or sleepy. Strength and balance checks. Your health care provider may recommend certain tests to check your strength and balance while standing, walking, or changing positions. Foot health exam. Foot pain and numbness, as well as not wearing proper footwear, can make you more likely to have a fall. Screenings, including: Osteoporosis screening. Osteoporosis is a condition that causes the bones to get weaker and break more easily. Blood pressure screening. Blood pressure changes and medicines to control blood pressure can make you feel dizzy.  Depression screening. You may be more likely to have a fall if you have a fear of falling, feel depressed, or feel unable to do activities that you used to do. Alcohol use screening. Using too much alcohol can affect your balance and may make you more likely to have a fall. Follow these instructions at home: Lifestyle Do not drink alcohol if: Your health care provider tells you not to drink. If you drink alcohol: Limit how much you have to: 0-1 drink a day for women. 0-2  drinks a day for men. Know how much alcohol is in your drink. In the [place] one drink equals one 12 oz bottle of beer (355 mL), one 5 oz glass of wine (148 mL), or one 1 oz glass of hard liquor (44 mL). Do not use any products that contain nicotine or tobacco. These products include cigarettes, chewing tobacco, and vaping devices, such as e-cigarettes. If you need help quitting, ask your health care provider. Activity  Follow a regular exercise program to stay fit. This will help you maintain your balance. Ask your health care provider what types of exercise are appropriate for you. If you need a cane or walker, use it as recommended by your health care provider. Wear supportive shoes that have nonskid soles. Safety  Remove any tripping hazards, such as rugs, cords, and clutter. Install safety equipment such as grab bars in bathrooms and safety rails on stairs. Keep rooms and walkways well-lit. General instructions Talk with your health care provider about your risks for falling. Tell your health care provider if: You fall. Be sure to tell your health care provider about all falls, even ones that seem minor. You feel dizzy, tiredness (fatigue), or off-balance. Take over-the-counter and prescription medicines only as told by your health care provider. These include supplements. Eat a healthy diet and maintain a healthy weight. A healthy diet includes low-fat dairy products, low-fat (lean) meats, and fiber from whole grains, beans, and lots of fruits and vegetables. Stay current with your vaccines. Schedule regular health, dental, and eye exams. Summary Having a healthy lifestyle and getting preventive care can help to protect your health and wellness after age 58. Screening and testing are the best way to find a health problem early and help you avoid having a fall. Early diagnosis and treatment give you the best chance for managing medical conditions that are more common for people who are  older than age 56. Falls are a major cause of broken bones and head injuries in people who are older than age 74. Take precautions to prevent a fall at home. Work with your health care provider to learn what changes you can make to improve your health and wellness and to prevent falls. This information is not intended to replace advice given to you by your health care provider. Make sure you discuss any questions you have with your health care provider. Document Revised: 03/03/2021 Document Reviewed: 03/03/2021 Elsevier Patient Education  [place], MD [facility]

## 2023-01-05 NOTE — Assessment & Plan Note (Signed)
Clinically euthyroid.  Continues Synthroid 112 mcg daily.

## 2023-01-08 ENCOUNTER — Other Ambulatory Visit: Payer: Self-pay | Admitting: Emergency Medicine

## 2023-01-08 DIAGNOSIS — K219 Gastro-esophageal reflux disease without esophagitis: Secondary | ICD-10-CM

## 2023-01-12 ENCOUNTER — Ambulatory Visit (INDEPENDENT_AMBULATORY_CARE_PROVIDER_SITE_OTHER): Payer: Medicare HMO

## 2023-01-12 VITALS — Ht 66.0 in | Wt 197.0 lb

## 2023-01-12 DIAGNOSIS — Z0000 Encounter for general adult medical examination without abnormal findings: Secondary | ICD-10-CM

## 2023-01-12 DIAGNOSIS — M4726 Other spondylosis with radiculopathy, lumbar region: Secondary | ICD-10-CM | POA: Diagnosis not present

## 2023-01-12 DIAGNOSIS — M25551 Pain in right hip: Secondary | ICD-10-CM | POA: Diagnosis not present

## 2023-01-12 NOTE — Progress Notes (Signed)
Subjective:   Erika Cervantes is a 67 y.o. female who presents for an Initial Medicare Annual Wellness Visit.  I connected with  Arsenio Katz on 01/12/23 by a audio enabled telemedicine application and verified that I am speaking with the correct person using two identifiers.  Patient Location: Home  Provider Location: Home Office  I discussed the limitations of evaluation and management by telemedicine. The patient expressed understanding and agreed to proceed.  Review of Systems     Cardiac Risk Factors include: advanced age (>68men, >53 women);hypertension;sedentary lifestyle;dyslipidemia     Objective:    Today's Vitals   01/12/23 1616  Weight: 197 lb (89.4 kg)  Height: 5\' 6"  (1.676 m)   Body mass index is 31.8 kg/m.     01/12/2023    4:18 PM 11/06/2018   10:00 PM  Advanced Directives  Does Patient Have a Medical Advance Directive? No No  Would patient like information on creating a medical advance directive? No - Patient declined No - Patient declined    Current Medications (verified) Outpatient Encounter Medications as of 01/12/2023  Medication Sig   acetaminophen (TYLENOL) 650 MG CR tablet Take 650 mg by mouth every 8 (eight) hours as needed for pain.   albuterol (PROVENTIL HFA;VENTOLIN HFA) 108 (90 Base) MCG/ACT inhaler Inhale 2 puffs into the lungs every 6 (six) hours as needed.   azithromycin (ZITHROMAX) 250 MG tablet Sig as indicated   colchicine 0.6 MG tablet Take 1 tablet (0.6 mg total) by mouth daily as needed.   HYDROcodone bit-homatropine (HYCODAN) 5-1.5 MG/5ML syrup Take 5 mLs by mouth at bedtime as needed for cough.   levothyroxine (SYNTHROID) 112 MCG tablet Take 1 tablet (112 mcg total) by mouth daily.   methotrexate (RHEUMATREX) 2.5 MG tablet SMARTSIG:7 Tablet(s) By Mouth Once a Week   omeprazole (PRILOSEC) 20 MG capsule TAKE 1 CAPSULE BY MOUTH DAILY   rosuvastatin (CRESTOR) 20 MG tablet Take 1 tablet (20 mg total) by mouth daily.    valsartan-hydrochlorothiazide (DIOVAN-HCT) 80-12.5 MG tablet Take 1 tablet by mouth daily.   No facility-administered encounter medications on file as of 01/12/2023.    Allergies (verified) Darvocet [propoxyphene n-acetaminophen]   History: Past Medical History:  Diagnosis Date   Allergy    Arthritis    GERD (gastroesophageal reflux disease)    High cholesterol    Hypertension    PONV (postoperative nausea and vomiting)    Thyroid disease    Past Surgical History:  Procedure Laterality Date   ABDOMINAL HYSTERECTOMY  late 1980s   Fibroids were indication.  pt says also had SPO bil.     APPENDECTOMY  late 1980s   done at time of hysto.     BACK SURGERY     BREAST SURGERY     Right breast mass   ESOPHAGOGASTRODUODENOSCOPY (EGD) WITH PROPOFOL N/A 11/08/2018   Procedure: ESOPHAGOGASTRODUODENOSCOPY (EGD) WITH PROPOFOL;  Surgeon: Milus Banister, MD;  Location: [facility] ENDOSCOPY;  Service: Endoscopy;  Laterality: N/A;   Hammer toes     TONSILLECTOMY     Family History  Problem Relation Age of Onset   Heart disease Mother    Rheum arthritis Mother    Colon cancer Neg Hx    Esophageal cancer Neg Hx    Stomach cancer Neg Hx    Rectal cancer Neg Hx    Social History   Socioeconomic History   Marital status: Married    Spouse name: Not on file   Number of children:  Not on file   Years of education: Not on file   Highest education level: Not on file  Occupational History   Not on file  Tobacco Use   Smoking status: Every Day    Packs/day: 0.10    Years: 22.00    Additional pack years: 0.00    Total pack years: 2.20    Types: Cigarettes   Smokeless tobacco: Never   Tobacco comments:    3 cigs daiy  Vaping Use   Vaping Use: Never used  Substance and Sexual Activity   Alcohol use: No   Drug use: No   Sexual activity: Not on file  Other Topics Concern   Not on file  Social History Narrative   Not on file   Social Determinants of Health   Financial Resource Strain:  Low Risk  (01/12/2023)   Overall Financial Resource Strain (CARDIA)    Difficulty of Paying Living Expenses: Not hard at all  Food Insecurity: No Food Insecurity (01/12/2023)   Hunger Vital Sign    Worried About Running Out of Food in the Last Year: Never true    Ran Out of Food in the Last Year: Never true  Transportation Needs: No Transportation Needs (01/12/2023)   PRAPARE - Hydrologist (Medical): No    Lack of Transportation (Non-Medical): No  Physical Activity: Insufficiently Active (01/12/2023)   Exercise Vital Sign    Days of Exercise per Week: 3 days    Minutes of Exercise per Session: 30 min  Stress: No Stress Concern Present (01/12/2023)   [place]    Feeling of Stress : Not at all  Social Connections: [place] (01/12/2023)   Social Connection and Isolation Panel [NHANES]    Frequency of Communication with Friends and Family: More than three times a week    Frequency of Social Gatherings with Friends and Family: More than three times a week    Attends Religious Services: More than 4 times per year    Active Member of [employer] or Organizations: Yes    Attends Music therapist: More than 4 times per year    Marital Status: Married    Tobacco Counseling Ready to quit: Not Answered Counseling given: Not Answered Tobacco comments: 3 cigs daiy   Clinical Intake:  Pre-visit preparation completed: Yes  Pain : No/denies pain  Diabetes: No  How often do you need to have someone help you when you read instructions, pamphlets, or other written materials from your doctor or pharmacy?: 1 - Never  Diabetic?No   Interpreter Needed?: No  Information entered by :: Denman George LPN   Activities of Daily Living    01/12/2023    4:19 PM  In your present state of health, do you have any difficulty performing the following activities:  Hearing? 0  Vision? 0   Difficulty concentrating or making decisions? 0  Walking or climbing stairs? 0  Dressing or bathing? 0  Doing errands, shopping? 0  Preparing Food and eating ? N  Using the Toilet? N  In the past six months, have you accidently leaked urine? N  Do you have problems with loss of bowel control? N  Managing your Medications? N  Managing your Finances? N  Housekeeping or managing your Housekeeping? N    Patient Care Team: Horald Pollen, MD as PCP - General (Internal Medicine) Valinda Party, MD (Rheumatology)  Indicate any recent Medical Services you  may have received from other than [facility] providers in the past year (date may be approximate).     Assessment:   This is a routine wellness examination for [place].  Hearing/Vision screen Hearing Screening - Comments:: Denies hearing difficulties  Vision Screening - Comments:: Wears rx glasses - up to date with routine eye exams [facility]   Dietary issues and exercise activities discussed: Current Exercise Habits: Home exercise routine, Type of exercise: walking, Time (Minutes): 30, Frequency (Times/Week): 3, Weekly Exercise (Minutes/Week): 90, Intensity: Mild   Goals Addressed             This Visit's Progress    Remain active and independent        Depression Screen    01/12/2023    4:17 PM 01/05/2023    8:52 AM 09/16/2022    1:09 PM 07/07/2022    9:28 AM 05/29/2022   10:45 AM 01/15/2022    3:00 PM 11/24/2021    4:40 PM  PHQ 2/9 Scores  PHQ - 2 Score 0 0 0 0 0 0 0    Fall Risk    01/12/2023    4:17 PM 01/05/2023    8:52 AM 09/16/2022    1:08 PM 07/07/2022    9:28 AM 05/29/2022   10:45 AM  [place] in the past year? 0 0 0 0 0  Number falls in past yr: 0 0 0 0 0  Injury with Fall? 0 0 0 0 0  Risk for fall due to : No Fall Risks No Fall Risks No Fall Risks No Fall Risks No Fall Risks  Follow up Falls prevention discussed;Education provided;Falls evaluation completed Falls evaluation completed  Falls evaluation completed Falls evaluation completed Falls prevention discussed    FALL RISK PREVENTION PERTAINING TO THE HOME:  Any stairs in or around the home? No  If so, are there any without handrails? No  Home free of loose throw rugs in walkways, pet beds, electrical cords, etc? Yes  Adequate lighting in your home to reduce risk of falls? Yes   ASSISTIVE DEVICES UTILIZED TO PREVENT FALLS:  Life alert? No  Use of a cane, walker or w/c? No  Grab bars in the bathroom? Yes  Shower chair or bench in shower? No  Elevated toilet seat or a handicapped toilet? Yes   TIMED UP AND GO:  Was the test performed? No . Telephonic visit   Cognitive Function:        01/12/2023    4:19 PM  6CIT Screen  What Year? 0 points  What month? 0 points  What time? 0 points  Count back from 20 0 points  Months in reverse 0 points  Repeat phrase 0 points  Total Score 0 points    Immunizations Immunization History  Administered Date(s) Administered   Fluad Quad(high Dose 65+) 07/08/2021, 07/07/2022   Influenza Split 08/28/2011   Influenza,inj,Quad PF,6+ Mos 10/06/2013, 07/10/2015, 07/15/2016, 10/24/2018, 08/01/2019   PFIZER(Purple Top)SARS-COV-2 Vaccination 01/09/2020, 01/29/2020, 09/03/2020   PNEUMOCOCCAL CONJUGATE-20 07/07/2022   Pneumococcal Polysaccharide-23 08/01/2019   Td 10/26/2002   Tdap 05/26/2012    TDAP status: Due, Education has been provided regarding the importance of this vaccine. Advised may receive this vaccine at local pharmacy or Health Dept. Aware to provide a copy of the vaccination record if obtained from local pharmacy or Health Dept. Verbalized acceptance and understanding.  Flu Vaccine status: Up to date  Pneumococcal vaccine status: Up to date  Covid-19 vaccine status:  Information provided on how to obtain vaccines.   Qualifies for Shingles Vaccine? Yes   Zostavax completed No   Shingrix Completed?: No.    Education has been provided regarding the  importance of this vaccine. Patient has been advised to call insurance company to determine out of pocket expense if they have not yet received this vaccine. Advised may also receive vaccine at local pharmacy or Health Dept. Verbalized acceptance and understanding.  Screening Tests Health Maintenance  Topic Date Due   Zoster Vaccines- Shingrix (1 of 2) Never done   DTaP/Tdap/Td (3 - Td or Tdap) 05/26/2022   COVID-19 Vaccine (4 - 2023-24 season) 06/26/2022   Medicare Annual Wellness (AWV)  01/12/2024   MAMMOGRAM  11/27/2024   COLONOSCOPY (Pts 45-68yrs Insurance coverage will need to be confirmed)  09/08/2025   Pneumonia Vaccine 68+ Years old  Completed   INFLUENZA VACCINE  Completed   DEXA SCAN  Completed   Hepatitis C Screening  Completed   HPV VACCINES  Aged Out    Health Maintenance  Health Maintenance Due  Topic Date Due   Zoster Vaccines- Shingrix (1 of 2) Never done   DTaP/Tdap/Td (3 - Td or Tdap) 05/26/2022   COVID-19 Vaccine (4 - 2023-24 season) 06/26/2022    Colorectal cancer screening: Type of screening: Colonoscopy. Completed 09/08/22. Repeat every 3 years  Mammogram status: Completed 11/27/22. Repeat every year  Bone Density status: Completed 07/14/22. Results reflect: Bone density results: NORMAL. Repeat every 5 years.  Lung Cancer Screening: (Low Dose CT Chest recommended if Age 26-80 years, 30 pack-year currently smoking OR have quit w/in 15years.) does not qualify.   Lung Cancer Screening Referral: n/a  Additional Screening:  Hepatitis C Screening: does qualify; Completed 08/01/19  Vision Screening: Recommended annual ophthalmology exams for early detection of glaucoma and other disorders of the eye. Is the patient up to date with their annual eye exam?  Yes  Who is the provider or what is the name of the office in which the patient attends annual eye exams? [facility]  If pt is not established with a provider, would they like to be referred to a  provider to establish care? No .   Dental Screening: Recommended annual dental exams for proper oral hygiene  Community Resource Referral / Chronic Care Management: CRR required this visit?  No   CCM required this visit?  No      Plan:     I have personally reviewed and noted the following in the patient's chart:   Medical and social history Use of alcohol, tobacco or illicit drugs  Current medications and supplements including opioid prescriptions. Patient is not currently taking opioid prescriptions. Functional ability and status Nutritional status Physical activity Advanced directives List of other physicians Hospitalizations, surgeries, and ER visits in previous 12 months Vitals Screenings to include cognitive, depression, and falls Referrals and appointments  In addition, I have reviewed and discussed with patient certain preventive protocols, quality metrics, and best practice recommendations. A written personalized care plan for preventive services as well as general preventive health recommendations were provided to patient.     Vanetta Mulders, [place]   [id number]   Due to this being a virtual visit, the after visit summary with patients personalized plan was offered to patient via mail or my-chart. Patient would like to access on my-chart  Nurse Notes: No concerns

## 2023-01-12 NOTE — Patient Instructions (Signed)
Erika Cervantes , Thank you for taking time to come for your Medicare Wellness Visit. I appreciate your ongoing commitment to your health goals. Please review the following plan we discussed and let me know if I can assist you in the future.   These are the goals we discussed:  Goals      Remain active and independent        This is a list of the screening recommended for you and due dates:  Health Maintenance  Topic Date Due   Zoster (Shingles) Vaccine (1 of 2) Never done   DTaP/Tdap/Td vaccine (3 - Td or Tdap) 05/26/2022   COVID-19 Vaccine (4 - 2023-24 season) 06/26/2022   Medicare Annual Wellness Visit  01/12/2024   Mammogram  11/27/2024   Colon Cancer Screening  09/08/2025   Pneumonia Vaccine  Completed   Flu Shot  Completed   DEXA scan (bone density measurement)  Completed   Hepatitis C Screening: USPSTF Recommendation to screen - Ages 102-79 yo.  Completed   HPV Vaccine  Aged Out    Advanced directives: Forms are available if you choose in the future to pursue completion.  This is recommended in order to make sure that your health wishes are honored in the event that you are unable to verbalize them to the provider.    Conditions/risks identified: Aim for 30 minutes of exercise or brisk walking, 6-8 glasses of water, and 5 servings of fruits and vegetables each day.   Next appointment: Follow up in one year for your annual wellness visit    Preventive Care 65 Years and Older, Female Preventive care refers to lifestyle choices and visits with your health care provider that can promote health and wellness. What does preventive care include? A yearly physical exam. This is also called an annual well check. Dental exams once or twice a year. Routine eye exams. Ask your health care provider how often you should have your eyes checked. Personal lifestyle choices, including: Daily care of your teeth and gums. Regular physical activity. Eating a healthy diet. Avoiding tobacco and  drug use. Limiting alcohol use. Practicing safe sex. Taking low-dose aspirin every day. Taking vitamin and mineral supplements as recommended by your health care provider. What happens during an annual well check? The services and screenings done by your health care provider during your annual well check will depend on your age, overall health, lifestyle risk factors, and family history of disease. Counseling  Your health care provider may ask you questions about your: Alcohol use. Tobacco use. Drug use. Emotional well-being. Home and relationship well-being. Sexual activity. Eating habits. History of falls. Memory and ability to understand (cognition). Work and work Statistician. Reproductive health. Screening  You may have the following tests or measurements: Height, weight, and BMI. Blood pressure. Lipid and cholesterol levels. These may be checked every 5 years, or more frequently if you are over 50 years old. Skin check. Lung cancer screening. You may have this screening every year starting at age 29 if you have a 30-pack-year history of smoking and currently smoke or have quit within the past 15 years. Fecal occult blood test (FOBT) of the stool. You may have this test every year starting at age 54. Flexible sigmoidoscopy or colonoscopy. You may have a sigmoidoscopy every 5 years or a colonoscopy every 10 years starting at age 24. Hepatitis C blood test. Hepatitis B blood test. Sexually transmitted disease (STD) testing. Diabetes screening. This is done by checking your blood sugar (glucose)  after you have not eaten for a while (fasting). You may have this done every 1-3 years. Bone density scan. This is done to screen for osteoporosis. You may have this done starting at age 40. Mammogram. This may be done every 1-2 years. Talk to your health care provider about how often you should have regular mammograms. Talk with your health care provider about your test results, treatment  options, and if necessary, the need for more tests. Vaccines  Your health care provider may recommend certain vaccines, such as: Influenza vaccine. This is recommended every year. Tetanus, diphtheria, and acellular pertussis (Tdap, Td) vaccine. You may need a Td booster every 10 years. Zoster vaccine. You may need this after age 68. Pneumococcal 13-valent conjugate (PCV13) vaccine. One dose is recommended after age 84. Pneumococcal polysaccharide (PPSV23) vaccine. One dose is recommended after age 54. Talk to your health care provider about which screenings and vaccines you need and how often you need them. This information is not intended to replace advice given to you by your health care provider. Make sure you discuss any questions you have with your health care provider. Document Released: 11/08/2015 Document Revised: 07/01/2016 Document Reviewed: 08/13/2015 Elsevier Interactive Patient Education  2017 [place] Prevention in the Home Falls can cause injuries. They can happen to people of all ages. There are many things you can do to make your home safe and to help prevent falls. What can I do on the outside of my home? Regularly fix the edges of walkways and driveways and fix any cracks. Remove anything that might make you trip as you walk through a door, such as a raised step or threshold. Trim any bushes or trees on the path to your home. Use bright outdoor lighting. Clear any walking paths of anything that might make someone trip, such as rocks or tools. Regularly check to see if handrails are loose or broken. Make sure that both sides of any steps have handrails. Any raised decks and porches should have guardrails on the edges. Have any leaves, snow, or ice cleared regularly. Use sand or salt on walking paths during winter. Clean up any spills in your garage right away. This includes oil or grease spills. What can I do in the bathroom? Use night lights. Install grab  bars by the toilet and in the tub and shower. Do not use towel bars as grab bars. Use non-skid mats or decals in the tub or shower. If you need to sit down in the shower, use a plastic, non-slip stool. Keep the floor dry. Clean up any water that spills on the floor as soon as it happens. Remove soap buildup in the tub or shower regularly. Attach bath mats securely with double-sided non-slip rug tape. Do not have throw rugs and other things on the floor that can make you trip. What can I do in the bedroom? Use night lights. Make sure that you have a light by your bed that is easy to reach. Do not use any sheets or blankets that are too big for your bed. They should not hang down onto the floor. Have a firm chair that has side arms. You can use this for support while you get dressed. Do not have throw rugs and other things on the floor that can make you trip. What can I do in the kitchen? Clean up any spills right away. Avoid walking on wet floors. Keep items that you use a lot in easy-to-reach places. If  you need to reach something above you, use a strong step stool that has a grab bar. Keep electrical cords out of the way. Do not use floor polish or wax that makes floors slippery. If you must use wax, use non-skid floor wax. Do not have throw rugs and other things on the floor that can make you trip. What can I do with my stairs? Do not leave any items on the stairs. Make sure that there are handrails on both sides of the stairs and use them. Fix handrails that are broken or loose. Make sure that handrails are as long as the stairways. Check any carpeting to make sure that it is firmly attached to the stairs. Fix any carpet that is loose or worn. Avoid having throw rugs at the top or bottom of the stairs. If you do have throw rugs, attach them to the floor with carpet tape. Make sure that you have a light switch at the top of the stairs and the bottom of the stairs. If you do not have them,  ask someone to add them for you. What else can I do to help prevent falls? Wear shoes that: Do not have high heels. Have rubber bottoms. Are comfortable and fit you well. Are closed at the toe. Do not wear sandals. If you use a stepladder: Make sure that it is fully opened. Do not climb a closed stepladder. Make sure that both sides of the stepladder are locked into place. Ask someone to hold it for you, if possible. Clearly mark and make sure that you can see: Any grab bars or handrails. First and last steps. Where the edge of each step is. Use tools that help you move around (mobility aids) if they are needed. These include: Canes. Walkers. Scooters. Crutches. Turn on the lights when you go into a dark area. Replace any light bulbs as soon as they burn out. Set up your furniture so you have a clear path. Avoid moving your furniture around. If any of your floors are uneven, fix them. If there are any pets around you, be aware of where they are. Review your medicines with your doctor. Some medicines can make you feel dizzy. This can increase your chance of falling. Ask your doctor what other things that you can do to help prevent falls. This information is not intended to replace advice given to you by your health care provider. Make sure you discuss any questions you have with your health care provider. Document Released: 08/08/2009 Document Revised: 03/19/2016 Document Reviewed: 11/16/2014 Elsevier Interactive Patient Education  2017 [employer].

## 2023-01-19 ENCOUNTER — Other Ambulatory Visit: Payer: Self-pay | Admitting: Neurosurgery

## 2023-01-19 DIAGNOSIS — M4726 Other spondylosis with radiculopathy, lumbar region: Secondary | ICD-10-CM

## 2023-01-26 NOTE — Addendum Note (Signed)
Addended by: Aviva Signs M on: 01/26/2023 10:59 AM   Modules accepted: Level of Service

## 2023-02-12 ENCOUNTER — Ambulatory Visit
Admission: RE | Admit: 2023-02-12 | Discharge: 2023-02-12 | Disposition: A | Discharge status: Home / Self Care | Payer: Medicare HMO | Source: Ambulatory Visit | Attending: Neurosurgery | Admitting: Neurosurgery

## 2023-02-12 DIAGNOSIS — M4726 Other spondylosis with radiculopathy, lumbar region: Secondary | ICD-10-CM

## 2023-02-12 DIAGNOSIS — M48061 Spinal stenosis, lumbar region without neurogenic claudication: Secondary | ICD-10-CM | POA: Diagnosis not present

## 2023-02-12 DIAGNOSIS — M47817 Spondylosis without myelopathy or radiculopathy, lumbosacral region: Secondary | ICD-10-CM | POA: Diagnosis not present

## 2023-02-12 DIAGNOSIS — M5126 Other intervertebral disc displacement, lumbar region: Secondary | ICD-10-CM | POA: Diagnosis not present

## 2023-02-12 MED ORDER — GADOPICLENOL 0.5 MMOL/ML IV SOLN
9.0000 mL | Freq: Once | INTRAVENOUS | Status: AC | PRN
  Administered 2023-02-12: 9 mL via INTRAVENOUS

## 2023-02-16 ENCOUNTER — Ambulatory Visit: Payer: Medicare HMO | Admitting: Internal Medicine

## 2023-03-02 DIAGNOSIS — Z6831 Body mass index (BMI) 31.0-31.9, adult: Secondary | ICD-10-CM | POA: Diagnosis not present

## 2023-03-02 DIAGNOSIS — M4316 Spondylolisthesis, lumbar region: Secondary | ICD-10-CM | POA: Diagnosis not present

## 2023-03-03 ENCOUNTER — Other Ambulatory Visit: Payer: Self-pay | Admitting: Neurosurgery

## 2023-03-29 ENCOUNTER — Other Ambulatory Visit: Payer: Self-pay | Admitting: Internal Medicine

## 2023-03-29 DIAGNOSIS — M1990 Unspecified osteoarthritis, unspecified site: Secondary | ICD-10-CM

## 2023-03-29 DIAGNOSIS — M79642 Pain in left hand: Secondary | ICD-10-CM

## 2023-03-30 ENCOUNTER — Other Ambulatory Visit: Payer: Self-pay | Admitting: Internal Medicine

## 2023-03-30 DIAGNOSIS — M79641 Pain in right hand: Secondary | ICD-10-CM

## 2023-03-30 DIAGNOSIS — M1990 Unspecified osteoarthritis, unspecified site: Secondary | ICD-10-CM

## 2023-03-30 NOTE — Progress Notes (Signed)
     Your surgery and Pre-Admission testing visit will be at [facility] located at [address], [place], [place] [address].  Please let all your doctors (i.e., Primary Care Physician, Cardiologist, Endocrinologist, Pulmonologist) know you are having surgery. You may need clearance for surgery. If you are on blood thinners, notify your surgeon and ask the doctor who prescribed them how long to hold them before surgery.  If you have had a heart test, such as an EKG, stress test, heart ultrasound, etc., or lab work performed outside of [place], please bring copies of these tests to your Pre-Admission testing, if possible.  These departments may contact you before the day of surgery:  Pre-Service Center - insurance/ billing: [phone] Pharmacy- to review your medications: [phone] Pre-Admission Testing- to set an appointment for your visit: [phone]  (Often, these numbers show up as "SPAM" on your phone)  The Pre-Admission Testing (PAT) visit focuses on Anesthesia for your upcoming surgery.  You do NOT need to fast; take your medications as usual. Please arrive 30 minutes early to allow for parking and admitting.  The visit may last up to an hour. Bring a photo ID and medical insurance card. Reschedule if you are sick. ([phone]) and please, NO children under age 16 at the visit.  During the PAT visit:  We will review your medical and surgical history.   You will receive pre-operative instructions, including the time of arrival at the hospital and surgical start time.  We will review what medication(s) you can take on the day of surgery.  After speaking with the nurse, you will have blood drawn and, if needed, a chest x-ray and EKG.  Most lab results from your doctor are good for 30 days, Hemoglobin A1C is good for 60 days. If you cannot talk to the Pharmacy, bring your medications or a list of them to the PST visit.   Infection control for the [facility] requires: All fingernail and toenail products should be removed before the day of surgery.  (SNS, Acrylic, Gel, Polish, Stickers, Press on, and Poly gel nails.)   Parking information:  Address: [facility] - [address], [place], [place] [address]  Please look for signs for entrance A off of [address]. Free valet parking is available Monday-Friday 05:30am-06:00pm     

## 2023-03-30 NOTE — Pre-Procedure Instructions (Signed)
Surgical Instructions    Your procedure is scheduled on Wednesday, June 12th.  Report to [facility] Main Entrance "A" at 06:30 A.M., then check in with the Admitting office.  Call this number if you have problems the morning of surgery:  [phone]  If you have any questions prior to your surgery date call [phone]: Open Monday-Friday 8am-4pm If you experience any cold or flu symptoms such as cough, fever, chills, shortness of breath, etc. between now and your scheduled surgery, please notify us at the above number.     Remember:  Do not eat after midnight the night before your surgery  You may drink clear liquids until 05:30 AM the morning of your surgery.   Clear liquids allowed are: Water, Non-Citrus Juices (without pulp), Carbonated Beverages, Clear Tea, Black Coffee Only (NO MILK, CREAM OR POWDERED CREAMER of any kind), and Gatorade.    Take these medicines the morning of surgery with A SIP OF WATER  colchicine  levothyroxine (SYNTHROID)  omeprazole (PRILOSEC)  rosuvastatin (CRESTOR)   If needed: acetaminophen (TYLENOL)  loratadine (CLARITIN)    Follow prescriber's instructions regarding methotrexate (RHEUMATREX).  As of today, STOP taking any Aspirin (unless otherwise instructed by your surgeon) Aleve, Naproxen, Ibuprofen, Motrin, Advil, Goody's, BC's, all herbal medications, fish oil, and all vitamins.                     Do NOT Smoke (Tobacco/Vaping) for 24 hours prior to your procedure.  If you use a CPAP at night, you may bring your mask/headgear for your overnight stay.   Contacts, glasses, piercing's, hearing aid's, dentures or partials may not be worn into surgery, please bring cases for these belongings.    For patients admitted to the hospital, discharge time will be determined by your treatment team.   Patients discharged the day of surgery will not be allowed to drive home, and someone needs to stay with them for 24 hours.  SURGICAL WAITING ROOM  VISITATION Patients having surgery or a procedure may have no more than 2 support people in the waiting area - these visitors may rotate.   Children under the age of 42 must have an adult with them who is not the patient. If the patient needs to stay at the hospital during part of their recovery, the visitor guidelines for inpatient rooms apply. Pre-op nurse will coordinate an appropriate time for 1 support person to accompany patient in pre-op.  This support person may not rotate.   Please refer to the [facility] website for the visitor guidelines for Inpatients (after your surgery is over and you are in a regular room).     Pre-operative 5 CHG Bath Instructions   You can play a key role in reducing the risk of infection after surgery. Your skin needs to be as free of germs as possible. You can reduce the number of germs on your skin by washing with CHG (chlorhexidine gluconate) soap before surgery. CHG is an antiseptic soap that kills germs and continues to kill germs even after washing.   DO NOT use if you have an allergy to chlorhexidine/CHG or antibacterial soaps. If your skin becomes reddened or irritated, stop using the CHG and notify one of our RNs at [phone].   Please shower with the CHG soap starting 4 days before surgery using the following schedule:     Please keep in mind the following:  DO NOT shave, including legs and underarms, starting the day of your first  shower.   You may shave your face at any point before/day of surgery.  Place clean sheets on your bed the day you start using CHG soap. Use a clean washcloth (not used since being washed) for each shower. DO NOT sleep with pets once you start using the CHG.   CHG Shower Instructions:  If you choose to wash your hair and private area, wash first with your normal shampoo/soap.  After you use shampoo/soap, rinse your hair and body thoroughly to remove shampoo/soap residue.  Turn the water OFF and apply about 3  tablespoons (45 ml) of CHG soap to a CLEAN washcloth.  Apply CHG soap ONLY FROM YOUR NECK DOWN TO YOUR TOES (washing for 3-5 minutes)  DO NOT use CHG soap on face, private areas, open wounds, or sores.  Pay special attention to the area where your surgery is being performed.  If you are having back surgery, having someone wash your back for you may be helpful. Wait 2 minutes after CHG soap is applied, then you may rinse off the CHG soap.  Pat dry with a clean towel  Put on clean clothes/pajamas   If you choose to wear lotion, please use ONLY the CHG-compatible lotions on the back of this paper.     Additional instructions for the day of surgery: DO NOT APPLY any lotions, deodorants, cologne, or perfumes.   Put on clean/comfortable clothes.  Brush your teeth.  Ask your nurse before applying any prescription medications to the skin. Do not wear jewelry or makeup Do not bring valuables to the hospital. [facility] is not responsible for any belongings or valuables. Do not wear nail polish, gel polish, artificial nails, or any other type of covering on natural nails (fingers and toes) If you have artificial nails or gel coating that need to be removed by a nail salon, please have this removed prior to surgery. Artificial nails or gel coating may interfere with anesthesia's ability to adequately monitor your vital signs.    CHG Compatible Lotions   Aveeno Moisturizing lotion  Cetaphil Moisturizing Cream  Cetaphil Moisturizing Lotion  Clairol Herbal Essence Moisturizing Lotion, Dry Skin  Clairol Herbal Essence Moisturizing Lotion, Extra Dry Skin  Clairol Herbal Essence Moisturizing Lotion, Normal Skin  Curel Age Defying Therapeutic Moisturizing Lotion with Alpha Hydroxy  Curel Extreme Care Body Lotion  Curel Soothing Hands Moisturizing Hand Lotion  Curel Therapeutic Moisturizing Cream, Fragrance-Free  Curel Therapeutic Moisturizing Lotion, Fragrance-Free  Curel Therapeutic Moisturizing  Lotion, Original Formula  Eucerin Daily Replenishing Lotion  Eucerin Dry Skin Therapy Plus Alpha Hydroxy Crme  Eucerin Dry Skin Therapy Plus Alpha Hydroxy Lotion  Eucerin Original Crme  Eucerin Original Lotion  Eucerin Plus Crme Eucerin Plus Lotion  Eucerin TriLipid Replenishing Lotion  Keri Anti-Bacterial Hand Lotion  Keri Deep Conditioning Original Lotion Dry Skin Formula Softly Scented  Keri Deep Conditioning Original Lotion, Fragrance Free Sensitive Skin Formula  Keri Lotion Fast Absorbing Fragrance Free Sensitive Skin Formula  Keri Lotion Fast Absorbing Softly Scented Dry Skin Formula  Keri Original Lotion  Keri Skin Renewal Lotion Keri Silky Smooth Lotion  Keri Silky Smooth Sensitive Skin Lotion  Nivea Body Creamy Conditioning Oil  Nivea Body Extra Enriched Teacher, adult education Moisturizing Lotion Nivea Crme  Nivea Skin Firming Lotion  NutraDerm 30 Skin Lotion  NutraDerm Skin Lotion  NutraDerm Therapeutic Skin Cream  NutraDerm Therapeutic Skin Lotion  ProShield Protective Hand Cream  Provon moisturizing lotion  Please read over the following fact sheets that you were given.    If you received a COVID test during your pre-op visit  it is requested that you wear a mask when out in public, stay away from anyone that may not be feeling well and notify your surgeon if you develop symptoms. If you have been in contact with anyone that has tested positive in the last 10 days please notify you surgeon.

## 2023-03-31 ENCOUNTER — Encounter (HOSPITAL_COMMUNITY): Payer: Self-pay

## 2023-03-31 ENCOUNTER — Encounter (HOSPITAL_COMMUNITY)
Admission: RE | Admit: 2023-03-31 | Discharge: 2023-03-31 | Disposition: A | Discharge status: Home / Self Care | Payer: Medicare HMO | Source: Ambulatory Visit | Attending: Neurosurgery | Admitting: Neurosurgery

## 2023-03-31 ENCOUNTER — Other Ambulatory Visit: Payer: Self-pay

## 2023-03-31 VITALS — BP 152/74 | HR 87 | Temp 99.0°F | Resp 18 | Ht 67.0 in | Wt 197.0 lb

## 2023-03-31 DIAGNOSIS — Z01818 Encounter for other preprocedural examination: Secondary | ICD-10-CM | POA: Diagnosis not present

## 2023-03-31 DIAGNOSIS — I10 Essential (primary) hypertension: Secondary | ICD-10-CM | POA: Insufficient documentation

## 2023-03-31 HISTORY — DX: Hypothyroidism, unspecified: E03.9

## 2023-03-31 LAB — BASIC METABOLIC PANEL
Anion gap: 10 (ref 5–15)
BUN: 17 mg/dL (ref 8–23)
CO2: 25 mmol/L (ref 22–32)
Calcium: 9.2 mg/dL (ref 8.9–10.3)
Chloride: 104 mmol/L (ref 98–111)
Creatinine, Ser: 1.18 mg/dL — ABNORMAL HIGH (ref 0.44–1.00)
GFR, Estimated: 51 mL/min — ABNORMAL LOW (ref >60)
Glucose, Bld: 99 mg/dL (ref 70–99)
Potassium: 3.2 mmol/L — ABNORMAL LOW (ref 3.5–5.1)
Sodium: 139 mmol/L (ref 135–145)

## 2023-03-31 LAB — CBC
HCT: 34.7 % — ABNORMAL LOW (ref 36.0–46.0)
Hemoglobin: 11.6 g/dL — ABNORMAL LOW (ref 12.0–15.0)
MCH: 29.1 pg (ref 26.0–34.0)
MCHC: 33.4 g/dL (ref 30.0–36.0)
MCV: 87.2 fL (ref 80.0–100.0)
Platelets: 302 10*3/uL (ref 150–400)
RBC: 3.98 MIL/uL (ref 3.87–5.11)
RDW: 14.6 % (ref 11.5–15.5)
WBC: 8.6 10*3/uL (ref 4.0–10.5)
nRBC: 0 % (ref 0.0–0.2)

## 2023-03-31 LAB — TYPE AND SCREEN
ABO/RH(D): A POS
Antibody Screen: NEGATIVE

## 2023-03-31 LAB — SURGICAL PCR SCREEN
MRSA, PCR: NEGATIVE
Staphylococcus aureus: NEGATIVE

## 2023-03-31 NOTE — Progress Notes (Signed)
PCP - Dr. Edwina Barth Rheumatologist- Dr. Kathi Ludwig Cardiologist - denies  PPM/ICD - denies   Chest x-ray - 11/06/18 EKG - 03/31/23 Stress Test - denies ECHO - denies Cardiac Cath - denies  Sleep Study - denies   DM- denies  ASA/Blood Thinner Instructions: n/a   ERAS Protcol - yes, no drink   COVID TEST- n/a   Anesthesia review: no  Patient denies shortness of breath, fever, cough and chest pain at PAT appointment   All instructions explained to the patient, with a verbal understanding of the material. Patient agrees to go over the instructions while at home for a better understanding. The opportunity to ask questions was provided.

## 2023-04-06 DIAGNOSIS — M4316 Spondylolisthesis, lumbar region: Secondary | ICD-10-CM | POA: Diagnosis not present

## 2023-04-07 ENCOUNTER — Ambulatory Visit (HOSPITAL_COMMUNITY): Admission: RE | Admit: 2023-04-07 | Payer: Medicare HMO | Source: Home / Self Care | Admitting: Neurosurgery

## 2023-04-07 SURGERY — POSTERIOR LUMBAR FUSION 1 LEVEL
Anesthesia: General

## 2023-04-13 DIAGNOSIS — M25559 Pain in unspecified hip: Secondary | ICD-10-CM | POA: Diagnosis not present

## 2023-04-13 DIAGNOSIS — M25511 Pain in right shoulder: Secondary | ICD-10-CM | POA: Diagnosis not present

## 2023-04-13 DIAGNOSIS — Z79899 Other long term (current) drug therapy: Secondary | ICD-10-CM | POA: Diagnosis not present

## 2023-04-13 DIAGNOSIS — M1990 Unspecified osteoarthritis, unspecified site: Secondary | ICD-10-CM | POA: Diagnosis not present

## 2023-04-13 DIAGNOSIS — M0579 Rheumatoid arthritis with rheumatoid factor of multiple sites without organ or systems involvement: Secondary | ICD-10-CM | POA: Diagnosis not present

## 2023-04-19 ENCOUNTER — Other Ambulatory Visit: Payer: Self-pay | Admitting: Neurosurgery

## 2023-04-26 NOTE — Progress Notes (Signed)
Spoke with patient and updated her with new times for arrival to hospital and times to stop all liquids on July 2-charted on pre-op phone call check list. Stated that all other instructions remained the same as those given to her at her PAT appointment. Patient acknowledged the changes and said that she had her printed instructions received at her appointment.

## 2023-04-26 NOTE — Anesthesia Preprocedure Evaluation (Signed)
Anesthesia Evaluation  Patient identified by MRN, date of birth, ID band Patient awake    Reviewed: Allergy & Precautions, H&P , NPO status , Patient's Chart, lab work & pertinent test results  History of Anesthesia Complications (+) PONV and history of anesthetic complications  Airway Mallampati: III  TM Distance: >3 FB Neck ROM: Full    Dental no notable dental hx. (+) Dental Advisory Given   Pulmonary Current Smoker and Patient abstained from smoking., former smoker   Pulmonary exam normal        Cardiovascular hypertension, Pt. on medications Normal cardiovascular exam     Neuro/Psych   Anxiety     negative neurological ROS     GI/Hepatic ,GERD  ,,  Endo/Other  Hypothyroidism    Renal/GU negative Renal ROS     Musculoskeletal   Abdominal  (+) + obese  Peds  Hematology negative hematology ROS (+)   Anesthesia Other Findings   Reproductive/Obstetrics                             Anesthesia Physical Anesthesia Plan  ASA: 3  Anesthesia Plan: General   Post-op Pain Management: Tylenol PO (pre-op)* and Toradol IV (intra-op)*   Induction: Intravenous  PONV Risk Score and Plan: 4 or greater and Ondansetron, Dexamethasone, Midazolam and Scopolamine patch - Pre-op  Airway Management Planned: Oral ETT  Additional Equipment:   Intra-op Plan:   Post-operative Plan: Extubation in OR  Informed Consent: I have reviewed the patients History and Physical, chart, labs and discussed the procedure including the risks, benefits and alternatives for the proposed anesthesia with the patient or authorized representative who has indicated his/her understanding and acceptance.     Dental advisory given  Plan Discussed with: Anesthesiologist and CRNA  Anesthesia Plan Comments:         Anesthesia Quick Evaluation

## 2023-04-27 ENCOUNTER — Encounter (HOSPITAL_COMMUNITY)
Admission: RE | Disposition: A | Discharge status: Home / Self Care | Payer: Self-pay | Source: Home / Self Care | Attending: Neurosurgery

## 2023-04-27 ENCOUNTER — Encounter (HOSPITAL_COMMUNITY): Payer: Self-pay | Admitting: Neurosurgery

## 2023-04-27 ENCOUNTER — Ambulatory Visit (HOSPITAL_COMMUNITY): Payer: Medicare HMO

## 2023-04-27 ENCOUNTER — Ambulatory Visit (HOSPITAL_BASED_OUTPATIENT_CLINIC_OR_DEPARTMENT_OTHER): Payer: Medicare HMO | Admitting: Anesthesiology

## 2023-04-27 ENCOUNTER — Other Ambulatory Visit: Payer: Self-pay

## 2023-04-27 ENCOUNTER — Ambulatory Visit (HOSPITAL_COMMUNITY)
Admission: RE | Admit: 2023-04-27 | Discharge: 2023-04-28 | Disposition: A | Discharge status: Home / Self Care | Payer: Medicare HMO | Attending: Neurosurgery | Admitting: Neurosurgery

## 2023-04-27 ENCOUNTER — Ambulatory Visit (HOSPITAL_COMMUNITY): Payer: Medicare HMO | Admitting: Anesthesiology

## 2023-04-27 DIAGNOSIS — M5126 Other intervertebral disc displacement, lumbar region: Secondary | ICD-10-CM | POA: Diagnosis not present

## 2023-04-27 DIAGNOSIS — Z981 Arthrodesis status: Secondary | ICD-10-CM | POA: Diagnosis not present

## 2023-04-27 DIAGNOSIS — M4316 Spondylolisthesis, lumbar region: Secondary | ICD-10-CM

## 2023-04-27 DIAGNOSIS — N1831 Chronic kidney disease, stage 3a: Secondary | ICD-10-CM

## 2023-04-27 DIAGNOSIS — F17210 Nicotine dependence, cigarettes, uncomplicated: Secondary | ICD-10-CM

## 2023-04-27 DIAGNOSIS — M48061 Spinal stenosis, lumbar region without neurogenic claudication: Secondary | ICD-10-CM | POA: Insufficient documentation

## 2023-04-27 DIAGNOSIS — I129 Hypertensive chronic kidney disease with stage 1 through stage 4 chronic kidney disease, or unspecified chronic kidney disease: Secondary | ICD-10-CM

## 2023-04-27 HISTORY — PX: BACK SURGERY: SHX140

## 2023-04-27 LAB — CBC
HCT: 35 % — ABNORMAL LOW (ref 36.0–46.0)
Hemoglobin: 11.3 g/dL — ABNORMAL LOW (ref 12.0–15.0)
MCH: 28.5 pg (ref 26.0–34.0)
MCHC: 32.3 g/dL (ref 30.0–36.0)
MCV: 88.2 fL (ref 80.0–100.0)
Platelets: 303 10*3/uL (ref 150–400)
RBC: 3.97 MIL/uL (ref 3.87–5.11)
RDW: 14.1 % (ref 11.5–15.5)
WBC: 10.5 10*3/uL (ref 4.0–10.5)
nRBC: 0 % (ref 0.0–0.2)

## 2023-04-27 LAB — CREATININE, SERUM
Creatinine, Ser: 1.2 mg/dL — ABNORMAL HIGH (ref 0.44–1.00)
GFR, Estimated: 50 mL/min — ABNORMAL LOW (ref >60)

## 2023-04-27 LAB — GLUCOSE, CAPILLARY: Glucose-Capillary: 113 mg/dL — ABNORMAL HIGH (ref 70–99)

## 2023-04-27 LAB — TYPE AND SCREEN
ABO/RH(D): A POS
Antibody Screen: NEGATIVE

## 2023-04-27 SURGERY — POSTERIOR LUMBAR FUSION 1 LEVEL
Anesthesia: General | Site: Spine Lumbar

## 2023-04-27 MED ORDER — ROCURONIUM BROMIDE 10 MG/ML (PF) SYRINGE
PREFILLED_SYRINGE | INTRAVENOUS | Status: AC
  Filled 2023-04-27: qty 10

## 2023-04-27 MED ORDER — LIDOCAINE 2% (20 MG/ML) 5 ML SYRINGE
INTRAMUSCULAR | Status: AC
  Filled 2023-04-27: qty 5

## 2023-04-27 MED ORDER — THROMBIN 20000 UNITS EX SOLR
CUTANEOUS | Status: AC
  Filled 2023-04-27: qty 20000

## 2023-04-27 MED ORDER — PHENYLEPHRINE 80 MCG/ML (10ML) SYRINGE FOR IV PUSH (FOR BLOOD PRESSURE SUPPORT)
PREFILLED_SYRINGE | INTRAVENOUS | Status: DC | PRN
  Administered 2023-04-27 (×2): 80 ug via INTRAVENOUS
  Administered 2023-04-27 (×2): 160 ug via INTRAVENOUS

## 2023-04-27 MED ORDER — DEXAMETHASONE SODIUM PHOSPHATE 10 MG/ML IJ SOLN
INTRAMUSCULAR | Status: AC
  Filled 2023-04-27: qty 1

## 2023-04-27 MED ORDER — FOLIC ACID 1 MG PO TABS
1.0000 mg | ORAL_TABLET | Freq: Every day | ORAL | Status: DC
  Administered 2023-04-27 – 2023-04-28 (×2): 1 mg via ORAL
  Filled 2023-04-27 (×2): qty 1

## 2023-04-27 MED ORDER — HYDROCHLOROTHIAZIDE 12.5 MG PO TABS
12.5000 mg | ORAL_TABLET | Freq: Every day | ORAL | Status: DC
  Administered 2023-04-28: 12.5 mg via ORAL
  Filled 2023-04-27: qty 1

## 2023-04-27 MED ORDER — ORAL CARE MOUTH RINSE: 15.0000 mL | Freq: Once | OROMUCOSAL | Status: AC

## 2023-04-27 MED ORDER — ONDANSETRON HCL 4 MG/2ML IJ SOLN: 4.0000 mg | Freq: Four times a day (QID) | INTRAMUSCULAR | Status: DC | PRN

## 2023-04-27 MED ORDER — ROSUVASTATIN CALCIUM 20 MG PO TABS
20.0000 mg | ORAL_TABLET | Freq: Every day | ORAL | Status: DC
  Administered 2023-04-28: 20 mg via ORAL
  Filled 2023-04-27: qty 1

## 2023-04-27 MED ORDER — DIAZEPAM 5 MG PO TABS: 5.0000 mg | ORAL_TABLET | Freq: Four times a day (QID) | ORAL | Status: DC | PRN

## 2023-04-27 MED ORDER — LEVOTHYROXINE SODIUM 112 MCG PO TABS
112.0000 ug | ORAL_TABLET | Freq: Every day | ORAL | Status: DC
  Administered 2023-04-28: 112 ug via ORAL
  Filled 2023-04-27: qty 1

## 2023-04-27 MED ORDER — VALSARTAN-HYDROCHLOROTHIAZIDE 80-12.5 MG PO TABS: 1.0000 | ORAL_TABLET | Freq: Every day | ORAL | Status: DC

## 2023-04-27 MED ORDER — POTASSIUM CHLORIDE IN NACL 20-0.9 MEQ/L-% IV SOLN
INTRAVENOUS | Status: DC
  Filled 2023-04-27 (×2): qty 1000

## 2023-04-27 MED ORDER — OXYCODONE HCL 5 MG/5ML PO SOLN: 5.0000 mg | Freq: Once | ORAL | Status: AC | PRN

## 2023-04-27 MED ORDER — LIDOCAINE-EPINEPHRINE 0.5 %-1:200000 IJ SOLN
INTRAMUSCULAR | Status: DC | PRN
  Administered 2023-04-27: 10 mL via INTRADERMAL

## 2023-04-27 MED ORDER — FENTANYL CITRATE (PF) 250 MCG/5ML IJ SOLN
INTRAMUSCULAR | Status: DC | PRN
  Administered 2023-04-27 (×2): 50 ug via INTRAVENOUS
  Administered 2023-04-27: 100 ug via INTRAVENOUS

## 2023-04-27 MED ORDER — MENTHOL 3 MG MT LOZG: 1.0000 | LOZENGE | OROMUCOSAL | Status: DC | PRN

## 2023-04-27 MED ORDER — PANTOPRAZOLE SODIUM 40 MG PO TBEC
40.0000 mg | DELAYED_RELEASE_TABLET | Freq: Every day | ORAL | Status: DC
  Administered 2023-04-28: 40 mg via ORAL
  Filled 2023-04-27: qty 1

## 2023-04-27 MED ORDER — TURMERIC 500 MG PO CAPS: ORAL_CAPSULE | Freq: Every day | ORAL | Status: DC

## 2023-04-27 MED ORDER — BUPIVACAINE HCL (PF) 0.5 % IJ SOLN
INTRAMUSCULAR | Status: DC | PRN
  Administered 2023-04-27: 30 mL

## 2023-04-27 MED ORDER — SODIUM CHLORIDE 0.9 % IV SOLN: 250.0000 mL | INTRAVENOUS | Status: DC

## 2023-04-27 MED ORDER — ACETAMINOPHEN 325 MG PO TABS: 650.0000 mg | ORAL_TABLET | ORAL | Status: DC | PRN

## 2023-04-27 MED ORDER — AZITHROMYCIN 250 MG PO TABS: 250.0000 mg | ORAL_TABLET | Freq: Every day | ORAL | Status: DC

## 2023-04-27 MED ORDER — IRBESARTAN 150 MG PO TABS
75.0000 mg | ORAL_TABLET | Freq: Every day | ORAL | Status: DC
  Administered 2023-04-28: 75 mg via ORAL
  Filled 2023-04-27: qty 1

## 2023-04-27 MED ORDER — DEXAMETHASONE SODIUM PHOSPHATE 10 MG/ML IJ SOLN
INTRAMUSCULAR | Status: DC | PRN
  Administered 2023-04-27: 10 mg via INTRAVENOUS

## 2023-04-27 MED ORDER — SODIUM CHLORIDE 0.9% FLUSH
3.0000 mL | Freq: Two times a day (BID) | INTRAVENOUS | Status: DC
  Administered 2023-04-27 – 2023-04-28 (×3): 3 mL via INTRAVENOUS

## 2023-04-27 MED ORDER — OXYCODONE HCL 5 MG PO TABS: 10.0000 mg | ORAL_TABLET | ORAL | Status: DC | PRN

## 2023-04-27 MED ORDER — LORATADINE 10 MG PO TABS: 10.0000 mg | ORAL_TABLET | Freq: Every day | ORAL | Status: DC | PRN

## 2023-04-27 MED ORDER — THROMBIN 20000 UNITS EX SOLR
CUTANEOUS | Status: DC | PRN
  Administered 2023-04-27: 20 mL via TOPICAL

## 2023-04-27 MED ORDER — OXYCODONE HCL ER 10 MG PO T12A
10.0000 mg | EXTENDED_RELEASE_TABLET | Freq: Two times a day (BID) | ORAL | Status: DC
  Administered 2023-04-27 – 2023-04-28 (×3): 10 mg via ORAL
  Filled 2023-04-27 (×3): qty 1

## 2023-04-27 MED ORDER — MORPHINE SULFATE (PF) 2 MG/ML IV SOLN
2.0000 mg | INTRAVENOUS | Status: DC | PRN
  Administered 2023-04-28: 2 mg via INTRAVENOUS
  Filled 2023-04-27: qty 1

## 2023-04-27 MED ORDER — ZOLPIDEM TARTRATE 5 MG PO TABS: 5.0000 mg | ORAL_TABLET | Freq: Every evening | ORAL | Status: DC | PRN

## 2023-04-27 MED ORDER — SCOPOLAMINE 1 MG/3DAYS TD PT72
1.0000 | MEDICATED_PATCH | TRANSDERMAL | Status: DC
  Administered 2023-04-27: 1.5 mg via TRANSDERMAL
  Filled 2023-04-27: qty 1

## 2023-04-27 MED ORDER — AMISULPRIDE (ANTIEMETIC) 5 MG/2ML IV SOLN: 10.0000 mg | Freq: Once | INTRAVENOUS | Status: DC | PRN

## 2023-04-27 MED ORDER — FENTANYL CITRATE (PF) 100 MCG/2ML IJ SOLN
25.0000 ug | INTRAMUSCULAR | Status: DC | PRN
  Administered 2023-04-27 (×4): 25 ug via INTRAVENOUS

## 2023-04-27 MED ORDER — SODIUM CHLORIDE 0.9% FLUSH: 3.0000 mL | INTRAVENOUS | Status: DC | PRN

## 2023-04-27 MED ORDER — FENTANYL CITRATE (PF) 250 MCG/5ML IJ SOLN
INTRAMUSCULAR | Status: AC
  Filled 2023-04-27: qty 5

## 2023-04-27 MED ORDER — ALBUMIN HUMAN 5 % IV SOLN: INTRAVENOUS | Status: DC | PRN

## 2023-04-27 MED ORDER — ACETAMINOPHEN 500 MG PO TABS
1000.0000 mg | ORAL_TABLET | Freq: Once | ORAL | Status: AC
  Administered 2023-04-27: 1000 mg via ORAL
  Filled 2023-04-27: qty 2

## 2023-04-27 MED ORDER — PHENOL 1.4 % MT LIQD
1.0000 | OROMUCOSAL | Status: DC | PRN
  Filled 2023-04-27: qty 177

## 2023-04-27 MED ORDER — LIDOCAINE-EPINEPHRINE 0.5 %-1:200000 IJ SOLN
INTRAMUSCULAR | Status: AC
  Filled 2023-04-27: qty 50

## 2023-04-27 MED ORDER — SUGAMMADEX SODIUM 200 MG/2ML IV SOLN
INTRAVENOUS | Status: DC | PRN
  Administered 2023-04-27 (×2): 100 mg via INTRAVENOUS

## 2023-04-27 MED ORDER — ACETAMINOPHEN 10 MG/ML IV SOLN: 1000.0000 mg | Freq: Once | INTRAVENOUS | Status: DC | PRN

## 2023-04-27 MED ORDER — LACTATED RINGERS IV SOLN: INTRAVENOUS | Status: DC

## 2023-04-27 MED ORDER — CEFAZOLIN SODIUM-DEXTROSE 2-4 GM/100ML-% IV SOLN
INTRAVENOUS | Status: AC
  Filled 2023-04-27: qty 100

## 2023-04-27 MED ORDER — OXYCODONE HCL 5 MG PO TABS: 5.0000 mg | ORAL_TABLET | ORAL | Status: DC | PRN

## 2023-04-27 MED ORDER — BUPIVACAINE HCL (PF) 0.5 % IJ SOLN
INTRAMUSCULAR | Status: AC
  Filled 2023-04-27: qty 30

## 2023-04-27 MED ORDER — PROPOFOL 10 MG/ML IV BOLUS
INTRAVENOUS | Status: AC
  Filled 2023-04-27: qty 20

## 2023-04-27 MED ORDER — CELECOXIB 200 MG PO CAPS
200.0000 mg | ORAL_CAPSULE | Freq: Two times a day (BID) | ORAL | Status: DC
  Administered 2023-04-27 – 2023-04-28 (×2): 200 mg via ORAL
  Filled 2023-04-27 (×3): qty 1

## 2023-04-27 MED ORDER — MIDAZOLAM HCL 2 MG/2ML IJ SOLN
INTRAMUSCULAR | Status: DC | PRN
  Administered 2023-04-27 (×2): 1 mg via INTRAVENOUS

## 2023-04-27 MED ORDER — CEFAZOLIN SODIUM-DEXTROSE 2-3 GM-%(50ML) IV SOLR
INTRAVENOUS | Status: DC | PRN
  Administered 2023-04-27: 2 g via INTRAVENOUS

## 2023-04-27 MED ORDER — ONDANSETRON HCL 4 MG/2ML IJ SOLN
INTRAMUSCULAR | Status: DC | PRN
  Administered 2023-04-27: 4 mg via INTRAVENOUS

## 2023-04-27 MED ORDER — OXYCODONE HCL 5 MG PO TABS
ORAL_TABLET | ORAL | Status: AC
  Filled 2023-04-27: qty 1

## 2023-04-27 MED ORDER — CHLORHEXIDINE GLUCONATE 0.12 % MT SOLN
15.0000 mL | Freq: Once | OROMUCOSAL | Status: AC
  Administered 2023-04-27: 15 mL via OROMUCOSAL
  Filled 2023-04-27: qty 15

## 2023-04-27 MED ORDER — ROCURONIUM BROMIDE 10 MG/ML (PF) SYRINGE
PREFILLED_SYRINGE | INTRAVENOUS | Status: DC | PRN
  Administered 2023-04-27: 20 mg via INTRAVENOUS
  Administered 2023-04-27: 80 mg via INTRAVENOUS
  Administered 2023-04-27: 20 mg via INTRAVENOUS

## 2023-04-27 MED ORDER — PHENYLEPHRINE HCL-NACL 20-0.9 MG/250ML-% IV SOLN
INTRAVENOUS | Status: DC | PRN
  Administered 2023-04-27: 30 ug/min via INTRAVENOUS

## 2023-04-27 MED ORDER — MIDAZOLAM HCL 2 MG/2ML IJ SOLN
INTRAMUSCULAR | Status: AC
  Filled 2023-04-27: qty 2

## 2023-04-27 MED ORDER — FENTANYL CITRATE (PF) 100 MCG/2ML IJ SOLN
INTRAMUSCULAR | Status: AC
  Filled 2023-04-27: qty 2

## 2023-04-27 MED ORDER — ONDANSETRON HCL 4 MG/2ML IJ SOLN
INTRAMUSCULAR | Status: AC
  Filled 2023-04-27: qty 2

## 2023-04-27 MED ORDER — ONDANSETRON HCL 4 MG PO TABS: 4.0000 mg | ORAL_TABLET | Freq: Four times a day (QID) | ORAL | Status: DC | PRN

## 2023-04-27 MED ORDER — OXYCODONE HCL 5 MG PO TABS
5.0000 mg | ORAL_TABLET | Freq: Once | ORAL | Status: AC | PRN
  Administered 2023-04-27: 5 mg via ORAL

## 2023-04-27 MED ORDER — LIDOCAINE 2% (20 MG/ML) 5 ML SYRINGE
INTRAMUSCULAR | Status: DC | PRN
  Administered 2023-04-27: 100 mg via INTRAVENOUS

## 2023-04-27 MED ORDER — PROPOFOL 10 MG/ML IV BOLUS
INTRAVENOUS | Status: DC | PRN
  Administered 2023-04-27: 150 mg via INTRAVENOUS

## 2023-04-27 MED ORDER — 0.9 % SODIUM CHLORIDE (POUR BTL) OPTIME
TOPICAL | Status: DC | PRN
  Administered 2023-04-27: 1000 mL

## 2023-04-27 MED ORDER — ACETAMINOPHEN 650 MG RE SUPP: 650.0000 mg | RECTAL | Status: DC | PRN

## 2023-04-27 MED ORDER — HEPARIN SODIUM (PORCINE) 5000 UNIT/ML IJ SOLN
5000.0000 [IU] | Freq: Three times a day (TID) | INTRAMUSCULAR | Status: DC
  Administered 2023-04-27 – 2023-04-28 (×2): 5000 [IU] via SUBCUTANEOUS
  Filled 2023-04-27 (×2): qty 1

## 2023-04-27 SURGICAL SUPPLY — 67 items
ADH SKN CLS APL DERMABOND .7 (GAUZE/BANDAGES/DRESSINGS) ×1
APL SKNCLS STERI-STRIP NONHPOA (GAUZE/BANDAGES/DRESSINGS)
BAG COUNTER SPONGE SURGICOUNT (BAG) ×1 IMPLANT
BAG SPNG CNTER NS LX DISP (BAG) ×1
BASKET BONE COLLECTION (BASKET) ×1 IMPLANT
BENZOIN TINCTURE PRP APPL 2/3 (GAUZE/BANDAGES/DRESSINGS) IMPLANT
BIT DRILL PLIF MAS DISP 5.5MM (DRILL) IMPLANT
BLADE BONE MILL MEDIUM (MISCELLANEOUS) ×1 IMPLANT
BLADE CLIPPER SURG (BLADE) IMPLANT
BUR MATCHSTICK NEURO 3.0 LAGG (BURR) ×1 IMPLANT
BUR PRECISION FLUTE 5.0 (BURR) ×1 IMPLANT
CANISTER SUCT 3000ML PPV (MISCELLANEOUS) ×1 IMPLANT
CAP RELINE MOD TULIP RMM (Cap) IMPLANT
CNTNR URN SCR LID CUP LEK RST (MISCELLANEOUS) ×1 IMPLANT
CONT SPEC 4OZ STRL OR WHT (MISCELLANEOUS) ×1
COVER BACK TABLE 60X90IN (DRAPES) ×1 IMPLANT
DERMABOND ADVANCED .7 DNX12 (GAUZE/BANDAGES/DRESSINGS) ×1 IMPLANT
DRAPE C-ARM 42X72 X-RAY (DRAPES) ×2 IMPLANT
DRAPE C-ARMOR (DRAPES) IMPLANT
DRAPE LAPAROTOMY 100X72X124 (DRAPES) ×1 IMPLANT
DRAPE SURG 17X23 STRL (DRAPES) ×1 IMPLANT
DRILL PLIF MAS DISP 5.5MM (DRILL) ×1
DRSG OPSITE POSTOP 4X6 (GAUZE/BANDAGES/DRESSINGS) IMPLANT
DURAPREP 26ML APPLICATOR (WOUND CARE) ×1 IMPLANT
ELECT REM PT RETURN 9FT ADLT (ELECTROSURGICAL) ×1
ELECTRODE REM PT RTRN 9FT ADLT (ELECTROSURGICAL) ×1 IMPLANT
GAUZE 4X4 16PLY ~~LOC~~+RFID DBL (SPONGE) IMPLANT
GAUZE SPONGE 4X4 12PLY STRL (GAUZE/BANDAGES/DRESSINGS) IMPLANT
GLOVE EXAM NITRILE XL STR (GLOVE) IMPLANT
GLOVE SURG LTX SZ6.5 (GLOVE) ×2 IMPLANT
GOWN STRL REUS W/ TWL LRG LVL3 (GOWN DISPOSABLE) ×2 IMPLANT
GOWN STRL REUS W/ TWL XL LVL3 (GOWN DISPOSABLE) IMPLANT
GOWN STRL REUS W/TWL 2XL LVL3 (GOWN DISPOSABLE) IMPLANT
GOWN STRL REUS W/TWL LRG LVL3 (GOWN DISPOSABLE) ×2
GOWN STRL REUS W/TWL XL LVL3 (GOWN DISPOSABLE)
KIT BASIN OR (CUSTOM PROCEDURE TRAY) ×1 IMPLANT
KIT POSITION SURG JACKSON T1 (MISCELLANEOUS) ×1 IMPLANT
KIT TURNOVER KIT B (KITS) ×1 IMPLANT
MILL BONE PREP (MISCELLANEOUS) IMPLANT
NDL HYPO 25X1 1.5 SAFETY (NEEDLE) ×1 IMPLANT
NDL SPNL 18GX3.5 QUINCKE PK (NEEDLE) IMPLANT
NEEDLE HYPO 25X1 1.5 SAFETY (NEEDLE) ×1 IMPLANT
NEEDLE SPNL 18GX3.5 QUINCKE PK (NEEDLE) IMPLANT
NS IRRIG 1000ML POUR BTL (IV SOLUTION) ×1 IMPLANT
PACK LAMINECTOMY NEURO (CUSTOM PROCEDURE TRAY) ×1 IMPLANT
PAD ARMBOARD 7.5X6 YLW CONV (MISCELLANEOUS) ×2 IMPLANT
ROD RELINE COCR LORD 5.0X35 (Rod) IMPLANT
ROD RELINE COCR LORD 5X30 (Rod) IMPLANT
SCREW LOCK RSS 4.5/5.0MM (Screw) IMPLANT
SCREW SHANK RELINE 6.5X40MM (Screw) IMPLANT
SCREW SHANK RELINE MOD 6.5X35 (Screw) IMPLANT
SOL ELECTROSURG ANTI STICK (MISCELLANEOUS) ×1
SOLUTION ELECTROSURG ANTI STCK (MISCELLANEOUS) ×1 IMPLANT
SPACER HALF DOME 9X22X9.5 10D (Spacer) IMPLANT
SPIKE FLUID TRANSFER (MISCELLANEOUS) ×1 IMPLANT
SPONGE SURGIFOAM ABS GEL 100 (HEMOSTASIS) ×1 IMPLANT
SPONGE T-LAP 4X18 ~~LOC~~+RFID (SPONGE) IMPLANT
STRIP CLOSURE SKIN 1/2X4 (GAUZE/BANDAGES/DRESSINGS) IMPLANT
SUT PROLENE 6 0 BV (SUTURE) IMPLANT
SUT VIC AB 0 CT1 18XCR BRD8 (SUTURE) ×1 IMPLANT
SUT VIC AB 0 CT1 8-18 (SUTURE) ×1
SUT VIC AB 2-0 CT1 18 (SUTURE) ×1 IMPLANT
SUT VIC AB 3-0 SH 8-18 (SUTURE) ×1 IMPLANT
TOWEL GREEN STERILE (TOWEL DISPOSABLE) ×1 IMPLANT
TOWEL GREEN STERILE FF (TOWEL DISPOSABLE) ×1 IMPLANT
TRAY FOLEY MTR SLVR 16FR STAT (SET/KITS/TRAYS/PACK) ×1 IMPLANT
WATER STERILE IRR 1000ML POUR (IV SOLUTION) ×1 IMPLANT

## 2023-04-27 NOTE — Anesthesia Procedure Notes (Addendum)
Procedure Name: Intubation Date/Time: 04/27/2023 8:14 AM  Performed by: Audie Pinto, CRNAPre-anesthesia Checklist: Patient identified, Emergency Drugs available, Suction available and Patient being monitored Patient Re-evaluated:Patient Re-evaluated prior to induction Oxygen Delivery Method: Circle system utilized Preoxygenation: Pre-oxygenation with 100% oxygen Induction Type: IV induction Ventilation: Mask ventilation without difficulty Laryngoscope Size: Mac and 4 Grade View: Grade III Tube type: Oral Tube size: 7.0 mm Number of attempts: 1 Airway Equipment and Method: Stylet and Oral airway Placement Confirmation: ETT inserted through vocal cords under direct vision, positive ETCO2 and breath sounds checked- equal and bilateral Secured at: 21 cm Tube secured with: Tape Dental Injury: Teeth and Oropharynx as per pre-operative assessment  Comments: Head/neck positioned by MDA prior to DL

## 2023-04-27 NOTE — Transfer of Care (Signed)
Immediate Anesthesia Transfer of Care Note  Patient: Erika Cervantes  Procedure(s) Performed: LUMBAR FOUR-FIVE POSTERIOR LUMBAR INTERBODY FUSION (Spine Lumbar)  Patient Location: PACU  Anesthesia Type:General  Level of Consciousness: drowsy and patient cooperative  Airway & Oxygen Therapy: Patient Spontanous Breathing and Patient connected to face mask oxygen  Post-op Assessment: Report given to RN, Post -op Vital signs reviewed and stable, and Patient moving all extremities X 4  Post vital signs: Reviewed and stable  Last Vitals:  Vitals Value Taken Time  BP 153/71 04/27/23 1155  Temp    Pulse 90 04/27/23 1158  Resp 20 04/27/23 1158  SpO2 100 % 04/27/23 1158  Vitals shown include unvalidated device data.  Last Pain:  Vitals:   04/27/23 0636  TempSrc:   PainSc: 8       Patients Stated Pain Goal: 0 (04/27/23 0636)  Complications: No notable events documented.

## 2023-04-27 NOTE — Op Note (Signed)
04/27/2023  7:45 PM  PATIENT:  Erika Cervantes  67 y.o. 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  BLOOD ADMINISTERED:none  CELL SAVER GIVEN:not used  COUNT:per nursingf  DRAINS: Urinary Catheter (Foley)   SPECIMEN:  No Specimen  DICTATION: Erika Cervantes is a 67 y.o. female whom was taken to the operating room intubated, and placed under a general anesthetic without difficulty. A foley catheter was placed under sterile conditions. She was positioned prone on a Jackson table with all pressure points properly padded.  her lumbar region was prepped and draped in a sterile manner. I infiltrated 10cc's 1/2%lidocaine/1:2000,000 strength epinephrine into the planned incision. I opened the skin with a 10 blade and took the incision down to the thoracolumbar fascia. I exposed the lamina of L3,4,and 5 in a subperiosteal fashion bilaterally. I confirmed my location with an intraoperative xray.  I placed self retaining retractors and started the decompression.  I decompressed the spinal canal at L4/5 via a near complete laminectomy of L4, inferior L4 facetectomies, partial superior L5 facetecomies. I used the drill, and Kerrison punches to remove the bone and decompress the L4 and L5 roots.  PLIF's were performed at L4/5 in the same fashion. I opened the disc space with a 15 blade then used a variety of instruments to remove the disc and prepare the space for the arthrodesis. I used curettes, rongeurs, punches, shavers for the disc space, and rasps in the  discetomy. I measured the disc space and placed expandable Astura half dome X titanium cages into the disc space(s). The cages and the disc space were packed with autograft morsels. We placed pedicle screws at L4, and L5, using fluoroscopic guidance. I drilled a pilot hole, then cannulated the pedicle with a drill at each site. We then tapped each pedicle, assessing each site for pedicle violations. No cutouts were appreciated. Screws (nuvasive mas plif) were then placed at each site without difficulty. I attached rods and locking caps with the appropriate tools. The locking caps were secured with torque limited screwdrivers. Final films were performed and the final construct appeared to be in good position.  I closed the wound in a layered fashion. I approximated the thoracolumbar fascia, subcutaneous, and subcuticular planes with vicryl sutures. I used dermabond and an occlusive bandage for a sterile dressing.     PLAN OF CARE: Admit to inpatient   PATIENT DISPOSITION:  PACU - hemodynamically stable.   Delay start of Pharmacological VTE agent (>24hrs) due to surgical blood loss or risk of bleeding:  yes

## 2023-04-27 NOTE — Anesthesia Postprocedure Evaluation (Signed)
Anesthesia Post Note  Patient: Erika Cervantes  Procedure(s) Performed: LUMBAR FOUR-FIVE POSTERIOR LUMBAR INTERBODY FUSION (Spine Lumbar)     Patient location during evaluation: PACU Anesthesia Type: General Level of consciousness: sedated Pain management: pain level controlled Vital Signs Assessment: post-procedure vital signs reviewed and stable Respiratory status: spontaneous breathing and respiratory function stable Cardiovascular status: stable Postop Assessment: no apparent nausea or vomiting Anesthetic complications: no   No notable events documented.  Last Vitals:  Vitals:   04/27/23 1300 04/27/23 1315  BP: (!) 147/77 (!) 137/95  Pulse: 80 81  Resp: 20 14  Temp:    SpO2: 95% 97%                 Chesney Suares DANIEL

## 2023-04-27 NOTE — H&P (Signed)
BP (!) 179/83   Pulse 87   Temp 97.8 F (36.6 C) (Oral)   Resp 16   Ht 5\' 7"  (1.702 m)   Wt 90.7 kg   SpO2 99%   BMI 31.32 kg/m   Erika Cervantes comes in today for evaluation of pain that she has in her lower back.  This is accompanied by right hip pain.  She has had this now for many years.  She has had 2 surgeries on the lumbar spine, both of which she deems successful.  She had undergone an x-ray of the right hip recently, and she was told that her spine looked crooked.  She was therefore sent to me for lower back pain.  She weighs 195 pounds.  Temperature is 98.5, blood pressure 175/91, pulse 120, pain is 8/10.  On repeat, the blood pressure is 162/83 and a pulse of 102.  She has had a breast tumor and has had these surgeries on the back and has had surgery on her breast.  She is allergic to Darvocet, which is no longer made.  She takes Omeprazole, Methotrexate, Folic Acid, Levothyroxine, Colchicine, and Valsartan.  Mother, 66, is deceased, had a myocardial infarction.  She has no information about her father.  Hypertension, diabetes, stroke, cancer, arthritis, myocardial infarction present in the family history.  She has had a relative operated on in this practice.  She has hip pain and lower back pain, says it has been going on for about a month with the recent exacerbation.  She believes arthritis started the pain.  Weakness is on the right lower extremity, numbness and tingling in the right lower extremity.  She has gained weight recently.  She has balance problems, sinus problem, hypercholesterolemia, swelling in the feet, leg pain with walking, arthritis, arm weakness, leg weakness, back pain, arm pain, leg pain, joint pain, and thyroid disease.     On examination, she is alert, oriented by 4.  She answers all questions appropriately.  Memory, language, attention span, and fund of knowledge are normal.  Speech is clear, it is also fluent.  2+ reflexes, biceps, triceps, brachioradialis, knees,  and ankles.  Tongue and uvula in midline.  Shoulder shrug is normal.  Hearing intact to voice.  Romberg is negative.  Muscle tone, bulk, coordination is normal. Erika Cervantes returns with the MRI of the lumbar spine performed on April 24.  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  She has a listhesis antero of L4 on L5.  She has severe and significant facet arthropathy at the same level.  This contributes to fairly severe spinal stenosis.  There are changes at other levels.  5-1 is markedly degenerated, and I will assume that that is where she had had her operation before, but that does not need a redo.  4-5 does need an operation.  I would certainly subscribe to pedicular fixation and cage insertion at the 4-5 level for true and thorough decompression of the thecal sac, L4 roots, and the foramen.  I have spoken with Erika Cervantes.  She is in agreement with this.  She says that the amount of discomfort she is in is just inhibiting her life and stopping her from doing the things that she normally does.

## 2023-04-28 DIAGNOSIS — M5126 Other intervertebral disc displacement, lumbar region: Secondary | ICD-10-CM | POA: Diagnosis not present

## 2023-04-28 DIAGNOSIS — M4316 Spondylolisthesis, lumbar region: Secondary | ICD-10-CM | POA: Diagnosis not present

## 2023-04-28 DIAGNOSIS — M48061 Spinal stenosis, lumbar region without neurogenic claudication: Secondary | ICD-10-CM | POA: Diagnosis not present

## 2023-04-28 LAB — GLUCOSE, CAPILLARY
Glucose-Capillary: 107 mg/dL — ABNORMAL HIGH (ref 70–99)
Glucose-Capillary: 120 mg/dL — ABNORMAL HIGH (ref 70–99)
Glucose-Capillary: 136 mg/dL — ABNORMAL HIGH (ref 70–99)
Glucose-Capillary: 84 mg/dL (ref 70–99)

## 2023-04-28 MED ORDER — HYDROCODONE-ACETAMINOPHEN 5-325 MG PO TABS: 1.0000 | ORAL_TABLET | Freq: Four times a day (QID) | ORAL | 0 refills | Status: AC | PRN

## 2023-04-28 MED ORDER — TIZANIDINE HCL 4 MG PO TABS: 4.0000 mg | ORAL_TABLET | Freq: Four times a day (QID) | ORAL | 0 refills | Status: AC | PRN

## 2023-04-28 NOTE — Evaluation (Signed)
Physical Therapy Evaluation Patient Details Name: Erika Cervantes MRN: [id number] DOB: Oct 22, 1956 Today's Date: 04/28/2023  History of Present Illness  Pt is a 67 y.o. female who presented 04/27/23 for elective L4-5 PLIF. PMH: GERD, HTN, hypothyroidism  Clinical Impression  Pt presents with condition above and deficits mentioned below, see PT Problem List. PTA, she was independent without DME, living with her husband in a 2-level house with 4 STE. Pt reports she had bil lower extremity weakness and numbness prior to surgery, which is no longer present upon evaluation, indicating great progress already since surgery. Pt does display some mild balance deficits and ambulates slowly and cautiously, but appears to likely be related to the pain meds reportedly making her feel "out of it". She was able to mobilize without DME at a supervision level at this time, but is requesting a RW at d/c to ensure stability and safety while still taking pain meds during her recovery. Pt will likely progress well and not need follow-up PT at d/c. Will continue to follow acutely to maximize her return to baseline prior to d/c home.        Assistance Recommended at Discharge PRN  If plan is discharge home, recommend the following:  Can travel by private vehicle  Assistance with cooking/housework        Equipment Recommendations Rolling walker (2 wheels)  Recommendations for Other Services       Functional Status Assessment Patient has had a recent decline in their functional status and demonstrates the ability to make significant improvements in function in a reasonable and predictable amount of time.     Precautions / Restrictions Precautions Precautions: Fall;Back Precaution Booklet Issued: Yes (comment) Precaution Comments: reviewed precautions Required Braces or Orthoses:  (no brace needed orders) Restrictions Weight Bearing Restrictions: No      Mobility  Bed Mobility Overal bed mobility: Needs  Assistance Bed Mobility: Rolling, Sidelying to Sit Rolling: Supervision Sidelying to sit: Supervision, HOB elevated       General bed mobility comments: Pt using bed rails with HOB slightly elevated to exit R side of bed, cuing pt for log rolling technique to maintain precautions, supervision for safety    Transfers Overall transfer level: Needs assistance Equipment used: None Transfers: Sit to/from Stand Sit to Stand: Supervision           General transfer comment: Supervision for safety, no LOB    Ambulation/Gait Ambulation/Gait assistance: Supervision Gait Distance (Feet): 240 Feet Assistive device: None Gait Pattern/deviations: Step-through pattern, Decreased stride length Gait velocity: reduced Gait velocity interpretation: <1.8 ft/sec, indicate of risk for recurrent falls   General Gait Details: Pt ambulates with a slow, step-through gait pattern with decreased bil step length. Pt prefers to be proximal to the wall or surface to grab as needed due to feeling "out of it" with the pain meds, but does not require UE support. No LOB, supervision for safety  Stairs Stairs: Yes Stairs assistance: Supervision Stair Management: Two rails, Alternating pattern, Step to pattern, Forwards Number of Stairs: 2 General stair comments: Ascends and descends slowly and cautiously. Step-to pattern ascending with bil rails, reciprocal pattern descending with bil rails. No LOB, supervision for safety  Wheelchair Mobility     Tilt Bed    Modified Rankin (Stroke Patients Only)       Balance Overall balance assessment: Mild deficits observed, not formally tested  Pertinent Vitals/Pain Pain Assessment Pain Assessment: Faces Faces Pain Scale: Hurts little more Pain Location: back Pain Descriptors / Indicators: Discomfort, Operative site guarding Pain Intervention(s): Limited activity within patient's tolerance,  Monitored during session, Repositioned    Home Living Family/patient expects to be discharged to:: Private residence Living Arrangements: Spouse/significant other Available Help at Discharge: Family Type of Home: House Home Access: Stairs to enter Entrance Stairs-Rails: Can reach both Entrance Stairs-Number of Steps: 4   Home Layout: Two level;Able to live on main level with bedroom/bathroom Home Equipment: Grab bars - tub/shower      Prior Function Prior Level of Function : Independent/Modified Independent;Driving             Mobility Comments: No AD       Hand Dominance        Extremity/Trunk Assessment   Upper Extremity Assessment Upper Extremity Assessment: Overall WFL for tasks assessed    Lower Extremity Assessment Lower Extremity Assessment: Overall WFL for tasks assessed (MMT scores of 5 grossly bil and sensation intact bil; improved after surgery compared to prior to surgery as pt reports bil lower extremity numbness and weakness prior to surgery)    Cervical / Trunk Assessment Cervical / Trunk Assessment: Back Surgery  Communication   Communication: No difficulties  Cognition Arousal/Alertness: Awake/alert Behavior During Therapy: WFL for tasks assessed/performed Overall Cognitive Status: Impaired/Different from baseline Area of Impairment: Problem solving                             Problem Solving: Slow processing General Comments: Pt reports feeling "out of it" from the pain meds but otherwise [facility] cognitively for tasks assessed        General Comments General comments (skin integrity, edema, etc.): educated pt on car transfers    Exercises     Assessment/Plan    PT Assessment Patient needs continued PT services  PT Problem List Pain;Decreased activity tolerance;Decreased balance;Decreased mobility       PT Treatment Interventions DME instruction;Gait training;Stair training;Functional mobility training;Therapeutic  activities;Therapeutic exercise;Balance training;Neuromuscular re-education;Patient/family education    PT Goals (Current goals can be found in the Care Plan section)  Acute Rehab PT Goals Patient Stated Goal: to remain independent PT Goal Formulation: With patient/family Time For Goal Achievement: 05/05/23 Potential to Achieve Goals: Good    Frequency Min 4X/week     Co-evaluation               AM-PAC PT "6 Clicks" Mobility  Outcome Measure Help needed turning from your back to your side while in a flat bed without using bedrails?: A Little Help needed moving from lying on your back to sitting on the side of a flat bed without using bedrails?: A Little Help needed moving to and from a bed to a chair (including a wheelchair)?: A Little Help needed standing up from a chair using your arms (e.g., wheelchair or bedside chair)?: A Little Help needed to walk in hospital room?: A Little Help needed climbing 3-5 steps with a railing? : A Little 6 Click Score: 18    End of Session Equipment Utilized During Treatment: Gait belt Activity Tolerance: Patient tolerated treatment well Patient left: in bed;with call bell/phone within reach;with bed alarm set;with family/visitor present   PT Visit Diagnosis: Unsteadiness on feet (R26.81);Other abnormalities of gait and mobility (R26.89);Pain Pain - part of body:  (back)    Time: 9604-5409 PT Time Calculation (min) (ACUTE ONLY): 14 min  Charges:   PT Evaluation $PT Eval Low Complexity: 1 Low   PT General Charges $$ ACUTE PT VISIT: 1 Visit         Raymond Gurney, PT, DPT Acute Rehabilitation Services  Office: [phone]   Jewel Baize 04/28/2023, 8:57 AM

## 2023-04-28 NOTE — Discharge Instructions (Signed)

## 2023-04-28 NOTE — Discharge Summary (Signed)
Physician Discharge Summary  Patient ID: Erika Cervantes MRN: [id number] DOB/AGE: May 19, 1956 67 y.o.  Admit date: 04/27/2023 Discharge date: 04/28/2023  Admission Diagnoses:spondylolisthesis L4/5   Discharge Diagnoses:  Principal Problem:   Spondylolisthesis of lumbar region   Discharged Condition: good  Hospital Course: Erika Cervantes was taken to the operating room and underwent a lumbar decompression and fusion at L4/5. Nuvasive pedicle screws and Half Dome X cages were placed. Post op she is ambulating, voiding, and tolerating a regular diet. Wound is without signs of infection.   Treatments: surgery: as above  Discharge Exam: Blood pressure (!) 114/45, pulse 80, temperature 98.5 F (36.9 C), temperature source Oral, resp. rate 17, height 5\' 7"  (1.702 m), weight 90.7 kg, SpO2 98 %. General appearance: alert, cooperative, and appears stated age  Disposition: Discharge disposition: 01-Home or Self Care      Spondylolisthesis, Lumbar region Discharge Instructions     Walker rolling   Complete by: As directed       Allergies as of 04/28/2023       Reactions   Darvocet [propoxyphene N-acetaminophen] Nausea And Vomiting        Medication List     TAKE these medications    acetaminophen 650 MG CR tablet Commonly known as: TYLENOL Take 650 mg by mouth every 8 (eight) hours as needed for pain.   albuterol 108 (90 Base) MCG/ACT inhaler Commonly known as: VENTOLIN HFA Inhale 2 puffs into the lungs every 6 (six) hours as needed.   azithromycin 250 MG tablet Commonly known as: ZITHROMAX Sig as indicated   CHOLESTEROL SUPPORT PO Take 1 capsule by mouth daily.   colchicine 0.6 MG tablet Take 1 tablet (0.6 mg total) by mouth daily as needed. What changed: when to take this   ELDERBERRY PO Take 1 capsule by mouth daily.   folic acid 1 MG tablet Commonly known as: FOLVITE Take 1 mg by mouth daily.   HYDROcodone bit-homatropine 5-1.5 MG/5ML syrup Commonly known as:  HYCODAN Take 5 mLs by mouth at bedtime as needed for cough.   HYDROcodone-acetaminophen 5-325 MG tablet Commonly known as: NORCO/VICODIN Take 1 tablet by mouth every 6 (six) hours as needed for up to 8 days for moderate pain.   levothyroxine 112 MCG tablet Commonly known as: SYNTHROID Take 1 tablet (112 mcg total) by mouth daily.   loratadine 10 MG tablet Commonly known as: CLARITIN Take 10 mg by mouth daily as needed for allergies.   methotrexate 2.5 MG tablet Commonly known as: RHEUMATREX Take 17.5 mg by mouth every Wednesday.   omeprazole 20 MG capsule Commonly known as: PRILOSEC TAKE 1 CAPSULE BY MOUTH DAILY   rosuvastatin 20 MG tablet Commonly known as: Crestor Take 1 tablet (20 mg total) by mouth daily.   tiZANidine 4 MG tablet Commonly known as: ZANAFLEX Take 1 tablet (4 mg total) by mouth every 6 (six) hours as needed for muscle spasms.   trolamine salicylate 10 % cream Commonly known as: ASPERCREME Apply 1 Application topically as needed for muscle pain.   TURMERIC PO Take 1 capsule by mouth daily.   valsartan-hydrochlorothiazide 80-12.5 MG tablet Commonly known as: DIOVAN-HCT Take 1 tablet by mouth daily.        Follow-up Information     Coletta Memos, MD Follow up.   Specialty: Neurosurgery Why: keep your scheduled appointment Contact information: [address] [place] [address] [phone]  SignedColetta Memos 04/28/2023, 12:39 PM

## 2023-04-28 NOTE — TOC Transition Note (Signed)
Transition of Care [facility]) - CM/SW Discharge Note   Patient Details  Name: Erika Cervantes MRN: [id number] Date of Birth: 03/31/1956  [facility] CM/SW Contact:  Kermit Balo, RN Phone Number: 04/28/2023, 12:42 PM   Clinical Narrative:    Pt is discharging home with self care. No f/u per PT.  Pt has transportation home.   Final next level of care: Home/Self Care Barriers to Discharge: No Barriers Identified   Patient Goals and CMS Choice      Discharge Placement                         Discharge Plan and Services Additional resources added to the After Visit Summary for                                       Social Determinants of Health (SDOH) Interventions SDOH Screenings   Food Insecurity: No Food Insecurity (04/27/2023)  Housing: Patient Declined (04/27/2023)  Transportation Needs: No Transportation Needs (04/27/2023)  Utilities: Not At Risk (04/27/2023)  Alcohol Screen: Low Risk  (01/12/2023)  Depression (PHQ2-9): Low Risk  (01/12/2023)  Financial Resource Strain: Low Risk  (01/12/2023)  Physical Activity: Insufficiently Active (01/12/2023)  Social Connections: Socially Integrated (01/12/2023)  Stress: No Stress Concern Present (01/12/2023)  Tobacco Use: High Risk (04/27/2023)     Readmission Risk Interventions     No data to display

## 2023-04-28 NOTE — Progress Notes (Signed)
OT Screen Note  Patient Details Name: Erika Cervantes MRN: [id number] DOB: 1956-10-25   Cancelled Treatment:    Reason Eval/Treat Not Completed: OT screened, no needs identified, will sign off  Mateo Flow 04/28/2023, 1:59 PM

## 2023-04-28 NOTE — Progress Notes (Signed)
Pt discharged to home in stable condition, all discharge instructions and prescriptions reviewed with and given to pt. Pt gives complete verbal understanding of above. Pt transported off unit via wheelchair with staff x1 at chairside to private vehicle for transport home.

## 2023-05-11 DIAGNOSIS — J22 Unspecified acute lower respiratory infection: Secondary | ICD-10-CM | POA: Diagnosis not present

## 2023-05-11 DIAGNOSIS — M4316 Spondylolisthesis, lumbar region: Secondary | ICD-10-CM | POA: Diagnosis not present

## 2023-05-11 DIAGNOSIS — M159 Polyosteoarthritis, unspecified: Secondary | ICD-10-CM | POA: Diagnosis not present

## 2023-05-11 DIAGNOSIS — M2550 Pain in unspecified joint: Secondary | ICD-10-CM | POA: Diagnosis not present

## 2023-05-11 DIAGNOSIS — M1990 Unspecified osteoarthritis, unspecified site: Secondary | ICD-10-CM | POA: Diagnosis not present

## 2023-05-20 DIAGNOSIS — Z6829 Body mass index (BMI) 29.0-29.9, adult: Secondary | ICD-10-CM | POA: Diagnosis not present

## 2023-05-20 DIAGNOSIS — M4316 Spondylolisthesis, lumbar region: Secondary | ICD-10-CM | POA: Diagnosis not present

## 2023-05-27 HISTORY — PX: OTHER SURGICAL HISTORY: SHX169

## 2023-06-10 ENCOUNTER — Encounter (INDEPENDENT_AMBULATORY_CARE_PROVIDER_SITE_OTHER): Payer: Self-pay

## 2023-06-21 DIAGNOSIS — H25813 Combined forms of age-related cataract, bilateral: Secondary | ICD-10-CM | POA: Diagnosis not present

## 2023-06-21 DIAGNOSIS — H25811 Combined forms of age-related cataract, right eye: Secondary | ICD-10-CM | POA: Diagnosis not present

## 2023-06-21 DIAGNOSIS — Z01818 Encounter for other preprocedural examination: Secondary | ICD-10-CM | POA: Diagnosis not present

## 2023-06-28 ENCOUNTER — Other Ambulatory Visit: Payer: Self-pay | Admitting: Emergency Medicine

## 2023-06-28 DIAGNOSIS — I10 Essential (primary) hypertension: Secondary | ICD-10-CM

## 2023-07-03 ENCOUNTER — Other Ambulatory Visit: Payer: Self-pay | Admitting: Emergency Medicine

## 2023-07-03 DIAGNOSIS — E039 Hypothyroidism, unspecified: Secondary | ICD-10-CM

## 2023-07-04 ENCOUNTER — Other Ambulatory Visit: Payer: Self-pay | Admitting: Emergency Medicine

## 2023-07-04 DIAGNOSIS — K219 Gastro-esophageal reflux disease without esophagitis: Secondary | ICD-10-CM

## 2023-07-09 DIAGNOSIS — H25811 Combined forms of age-related cataract, right eye: Secondary | ICD-10-CM | POA: Diagnosis not present

## 2023-07-09 DIAGNOSIS — H268 Other specified cataract: Secondary | ICD-10-CM | POA: Diagnosis not present

## 2023-07-09 DIAGNOSIS — H25812 Combined forms of age-related cataract, left eye: Secondary | ICD-10-CM | POA: Diagnosis not present

## 2023-07-13 ENCOUNTER — Other Ambulatory Visit: Payer: Self-pay | Admitting: Emergency Medicine

## 2023-07-13 ENCOUNTER — Encounter: Payer: Self-pay | Admitting: Emergency Medicine

## 2023-07-13 ENCOUNTER — Ambulatory Visit (INDEPENDENT_AMBULATORY_CARE_PROVIDER_SITE_OTHER): Payer: Medicare HMO | Admitting: Emergency Medicine

## 2023-07-13 VITALS — BP 138/88 | HR 86 | Temp 98.2°F | Ht 67.0 in | Wt 196.1 lb

## 2023-07-13 DIAGNOSIS — E039 Hypothyroidism, unspecified: Secondary | ICD-10-CM

## 2023-07-13 DIAGNOSIS — Z789 Other specified health status: Secondary | ICD-10-CM

## 2023-07-13 DIAGNOSIS — F17200 Nicotine dependence, unspecified, uncomplicated: Secondary | ICD-10-CM

## 2023-07-13 DIAGNOSIS — E785 Hyperlipidemia, unspecified: Secondary | ICD-10-CM

## 2023-07-13 DIAGNOSIS — K219 Gastro-esophageal reflux disease without esophagitis: Secondary | ICD-10-CM

## 2023-07-13 DIAGNOSIS — N1831 Chronic kidney disease, stage 3a: Secondary | ICD-10-CM

## 2023-07-13 DIAGNOSIS — Z23 Encounter for immunization: Secondary | ICD-10-CM

## 2023-07-13 DIAGNOSIS — I10 Essential (primary) hypertension: Secondary | ICD-10-CM | POA: Diagnosis not present

## 2023-07-13 DIAGNOSIS — M159 Polyosteoarthritis, unspecified: Secondary | ICD-10-CM | POA: Diagnosis not present

## 2023-07-13 DIAGNOSIS — M150 Primary generalized (osteo)arthritis: Secondary | ICD-10-CM

## 2023-07-13 LAB — LIPID PANEL
Cholesterol: 217 mg/dL — ABNORMAL HIGH (ref 0–200)
HDL: 46.9 mg/dL (ref >39.00)
LDL Cholesterol: 139 mg/dL — ABNORMAL HIGH (ref 0–99)
NonHDL: 169.6
Total CHOL/HDL Ratio: 5
Triglycerides: 155 mg/dL — ABNORMAL HIGH (ref 0.0–149.0)
VLDL: 31 mg/dL (ref 0.0–40.0)

## 2023-07-13 LAB — CBC WITH DIFFERENTIAL/PLATELET
Basophils Absolute: 0.1 10*3/uL (ref 0.0–0.1)
Basophils Relative: 1 % (ref 0.0–3.0)
Eosinophils Absolute: 0.2 10*3/uL (ref 0.0–0.7)
Eosinophils Relative: 2.2 % (ref 0.0–5.0)
HCT: 36.7 % (ref 36.0–46.0)
Hemoglobin: 11.9 g/dL — ABNORMAL LOW (ref 12.0–15.0)
Lymphocytes Relative: 29.5 % (ref 12.0–46.0)
Lymphs Abs: 2.6 10*3/uL (ref 0.7–4.0)
MCHC: 32.4 g/dL (ref 30.0–36.0)
MCV: 86.6 fl (ref 78.0–100.0)
Monocytes Absolute: 0.7 10*3/uL (ref 0.1–1.0)
Monocytes Relative: 7.8 % (ref 3.0–12.0)
Neutro Abs: 5.2 10*3/uL (ref 1.4–7.7)
Neutrophils Relative %: 59.5 % (ref 43.0–77.0)
Platelets: 336 10*3/uL (ref 150.0–400.0)
RBC: 4.24 Mil/uL (ref 3.87–5.11)
RDW: 15.9 % — ABNORMAL HIGH (ref 11.5–15.5)
WBC: 8.7 10*3/uL (ref 4.0–10.5)

## 2023-07-13 LAB — COMPREHENSIVE METABOLIC PANEL
ALT: 15 U/L (ref 0–35)
AST: 23 U/L (ref 0–37)
Albumin: 3.9 g/dL (ref 3.5–5.2)
Alkaline Phosphatase: 55 U/L (ref 39–117)
BUN: 14 mg/dL (ref 6–23)
CO2: 30 meq/L (ref 19–32)
Calcium: 9.4 mg/dL (ref 8.4–10.5)
Chloride: 103 meq/L (ref 96–112)
Creatinine, Ser: 1.04 mg/dL (ref 0.40–1.20)
GFR: 55.79 mL/min — ABNORMAL LOW (ref >60.00)
Glucose, Bld: 82 mg/dL (ref 70–99)
Potassium: 3.4 meq/L — ABNORMAL LOW (ref 3.5–5.1)
Sodium: 141 meq/L (ref 135–145)
Total Bilirubin: 0.5 mg/dL (ref 0.2–1.2)
Total Protein: 7.1 g/dL (ref 6.0–8.3)

## 2023-07-13 LAB — TSH: TSH: 15.59 u[IU]/mL — ABNORMAL HIGH (ref 0.35–5.50)

## 2023-07-13 LAB — HEMOGLOBIN A1C: Hgb A1c MFr Bld: 5.5 % (ref 4.6–6.5)

## 2023-07-13 MED ORDER — LEVOTHYROXINE SODIUM 125 MCG PO TABS: 125.0000 ug | ORAL_TABLET | Freq: Every day | ORAL | 3 refills | Status: DC

## 2023-07-13 MED ORDER — EZETIMIBE 10 MG PO TABS
10.0000 mg | ORAL_TABLET | Freq: Every day | ORAL | 3 refills | Status: DC
Start: 2023-07-13 — End: 2024-07-04

## 2023-07-13 NOTE — Progress Notes (Signed)
[name] 67 y.o.   Chief Complaint  Patient presents with   Medical Management of Chronic Issues    f/u appt, cholesterol medication she states her joints hurt     HISTORY OF PRESENT ILLNESS: This is a 67 y.o. female here for 32-month follow-up of chronic medical conditions Intolerant to statins Recent back surgery.  Doing well.  No concerns or complications. No complaints or any other medical concerns today.  HPI   Prior to Admission medications   Medication Sig Start Date End Date Taking? Authorizing Provider  acetaminophen (TYLENOL) 650 MG CR tablet Take 650 mg by mouth every 8 (eight) hours as needed for pain.   Yes [provider]  colchicine 0.6 MG tablet Take 1 tablet (0.6 mg total) by mouth daily as needed. Patient taking differently: Take 0.6 mg by mouth daily. 09/11/22  Yes Rice, Jamesetta Orleans, MD  ELDERBERRY PO Take 1 capsule by mouth daily.   Yes [provider]  folic acid (FOLVITE) 1 MG tablet Take 1 mg by mouth daily.   Yes [provider]  levothyroxine (SYNTHROID) 112 MCG tablet TAKE 1 TABLET BY MOUTH DAILY 07/04/23  Yes Taneisha Fuson, Eilleen Kempf, MD  loratadine (CLARITIN) 10 MG tablet Take 10 mg by mouth daily as needed for allergies.   Yes [provider]  methotrexate (RHEUMATREX) 2.5 MG tablet Take 17.5 mg by mouth every Wednesday. 12/09/22  Yes [provider]  Misc Natural Products (CHOLESTEROL SUPPORT PO) Take 1 capsule by mouth daily.   Yes [provider]  ofloxacin (OCUFLOX) 0.3 % ophthalmic solution  07/06/23  Yes [provider]  omeprazole (PRILOSEC) 20 MG capsule TAKE 1 CAPSULE BY MOUTH DAILY 07/04/23  Yes Lacy Sofia, Eilleen Kempf, MD  prednisoLONE acetate (PRED FORTE) 1 % ophthalmic suspension SMARTSIG:In Eye(s) 07/06/23  Yes [provider]  tiZANidine (ZANAFLEX) 4 MG tablet Take 1 tablet (4 mg total) by mouth every 6 (six) hours as needed for muscle spasms. 04/28/23  Yes Coletta Memos,  MD  trolamine salicylate (ASPERCREME) 10 % cream Apply 1 Application topically as needed for muscle pain.   Yes [provider]  TURMERIC PO Take 1 capsule by mouth daily.   Yes [provider]  valsartan-hydrochlorothiazide (DIOVAN-HCT) 80-12.5 MG tablet TAKE 1 TABLET BY MOUTH DAILY 06/28/23  Yes Jisella Ashenfelter, Eilleen Kempf, MD  albuterol (PROVENTIL HFA;VENTOLIN HFA) 108 (90 Base) MCG/ACT inhaler Inhale 2 puffs into the lungs every 6 (six) hours as needed. Patient not taking: Reported on 03/26/2023 12/07/17   Wallis Bamberg, PA-C  azithromycin [facility]) 250 MG tablet Sig as indicated Patient not taking: Reported on 07/13/2023 01/05/23   Georgina Quint, MD  HYDROcodone bit-homatropine [facility]) 5-1.5 MG/5ML syrup Take 5 mLs by mouth at bedtime as needed for cough. Patient not taking: Reported on 03/26/2023 01/05/23   Georgina Quint, MD  rosuvastatin (CRESTOR) 20 MG tablet Take 1 tablet (20 mg total) by mouth daily. Patient not taking: Reported on 07/13/2023 01/05/23   Georgina Quint, MD    Allergies  Allergen Reactions   Darvocet [Propoxyphene N-Acetaminophen] Nausea And Vomiting    Patient Active Problem List   Diagnosis Date Noted   Spondylolisthesis of lumbar region 04/27/2023   Lower respiratory infection 01/05/2023   Acute cough 01/05/2023   Chronic joint pain 09/16/2022   Positive colorectal cancer screening using Cologuard test 07/07/2022   Stage 3a chronic kidney disease (HCC) 01/08/2022   Current smoker 01/06/2022   Bilateral hand pain 08/28/2021  Positive ANA (antinuclear antibody) 08/28/2021   Inflammatory arthritis 08/28/2021   Primary osteoarthritis involving multiple joints 07/08/2021   Dyslipidemia 08/01/2019   Degenerative arthritis of thumb 09/04/2015   Vitamin D deficiency 05/26/2012   Hypothyroidism 02/26/2012   Hyperlipidemia with target LDL less than 100 02/26/2012   Essential hypertension 02/26/2012   Obesity (BMI 30.0-34.9)  02/26/2012   Bulging lumbar disc 02/26/2012   GERD (gastroesophageal reflux disease) 02/26/2012    Past Medical History:  Diagnosis Date   Allergy    Arthritis    GERD (gastroesophageal reflux disease)    High cholesterol    Hypertension    Hypothyroidism    PONV (postoperative nausea and vomiting)    Thyroid disease     Past Surgical History:  Procedure Laterality Date   ABDOMINAL HYSTERECTOMY  late 1980s   Fibroids were indication.  pt says also had SPO bil.     APPENDECTOMY  late 1980s   done at time of hysto.     BACK SURGERY     BREAST SURGERY     Right breast mass   ESOPHAGOGASTRODUODENOSCOPY (EGD) WITH PROPOFOL N/A 11/08/2018   Procedure: ESOPHAGOGASTRODUODENOSCOPY (EGD) WITH PROPOFOL;  Surgeon: Rachael Fee, MD;  Location: [facility] ENDOSCOPY;  Service: Endoscopy;  Laterality: N/A;   Hammer toes     TONSILLECTOMY      Social History   Socioeconomic History   Marital status: Married    Spouse name: Not on file   Number of children: 1   Years of education: Not on file   Highest education level: Not on file  Occupational History   Not on file  Tobacco Use   Smoking status: Every Day    Current packs/day: 0.10    Average packs/day: 0.1 packs/day for 22.0 years (2.2 ttl pk-yrs)    Types: Cigarettes   Smokeless tobacco: Never   Tobacco comments:    3 cigs daiy  Vaping Use   Vaping status: Never Used  Substance and Sexual Activity   Alcohol use: No   Drug use: No   Sexual activity: Not on file  Other Topics Concern   Not on file  Social History Narrative   1 biological child. 2 stepchildren   Social Determinants of Health   Financial Resource Strain: Low Risk  (01/12/2023)   Overall Financial Resource Strain (CARDIA)    Difficulty of Paying Living Expenses: Not hard at all  Food Insecurity: No Food Insecurity (04/27/2023)   Hunger Vital Sign    Worried About Running Out of Food in the Last Year: Never true    Ran Out of Food in the Last Year: Never true   Transportation Needs: No Transportation Needs (04/27/2023)   PRAPARE - Administrator, Civil Service (Medical): No    Lack of Transportation (Non-Medical): No  Physical Activity: Insufficiently Active (01/12/2023)   Exercise Vital Sign    Days of Exercise per Week: 3 days    Minutes of Exercise per Session: 30 min  Stress: No Stress Concern Present (01/12/2023)   [employer] of Occupational Health - Occupational Stress Questionnaire    Feeling of Stress : Not at all  Social Connections: Socially Integrated (01/12/2023)   Social Connection and Isolation Panel [NHANES]    Frequency of Communication with Friends and Family: More than three times a week    Frequency of Social Gatherings with Friends and Family: More than three times a week    Attends Religious Services: More than 4 times per  year    Active Member of Clubs or Organizations: Yes    Attends Banker Meetings: More than 4 times per year    Marital Status: Married  Catering manager Violence: Not At Risk (04/27/2023)   Humiliation, Afraid, Rape, and Kick questionnaire    Fear of Current or Ex-Partner: No    Emotionally Abused: No    Physically Abused: No    Sexually Abused: No    Family History  Problem Relation Age of Onset   Heart disease Mother    Rheum arthritis Mother    Colon cancer Neg Hx    Esophageal cancer Neg Hx    Stomach cancer Neg Hx    Rectal cancer Neg Hx      Review of Systems  Constitutional: Negative.  Negative for chills and fever.  HENT: Negative.  Negative for congestion and sore throat.   Respiratory: Negative.  Negative for cough and shortness of breath.   Cardiovascular: Negative.  Negative for chest pain and palpitations.  Gastrointestinal:  Negative for abdominal pain, diarrhea, nausea and vomiting.  Genitourinary: Negative.  Negative for dysuria and hematuria.  Skin: Negative.  Negative for rash.  Neurological: Negative.  Negative for dizziness and headaches.   All other systems reviewed and are negative.   Vitals:   07/13/23 0906  BP: 138/88  Pulse: 86  Temp: 98.2 F (36.8 C)  SpO2: 98%    Physical Exam Vitals reviewed.  Constitutional:      Appearance: Normal appearance.  HENT:     Head: Normocephalic.     Mouth/Throat:     Mouth: Mucous membranes are moist.     Pharynx: Oropharynx is clear.  Eyes:     Extraocular Movements: Extraocular movements intact.     Conjunctiva/sclera: Conjunctivae normal.     Pupils: Pupils are equal, round, and reactive to light.  Cardiovascular:     Rate and Rhythm: Normal rate and regular rhythm.     Pulses: Normal pulses.     Heart sounds: Normal heart sounds.  Pulmonary:     Effort: Pulmonary effort is normal.     Breath sounds: Normal breath sounds.  Musculoskeletal:     Cervical back: No tenderness.  Lymphadenopathy:     Cervical: No cervical adenopathy.  Skin:    General: Skin is warm and dry.     Capillary Refill: Capillary refill takes less than 2 seconds.  Neurological:     General: No focal deficit present.     Mental Status: She is alert and oriented to person, place, and time.  Psychiatric:        Mood and Affect: Mood normal.        Behavior: Behavior normal.      ASSESSMENT & PLAN: A total of 44 minutes was spent with the patient and counseling/coordination of care regarding preparing for this visit, review of most recent office visit notes, review of multiple chronic medical conditions under management, review of most recent blood work results, cardiovascular risks associated with hypertension, review of all medications, education on nutrition, review of health maintenance items, prognosis, documentation, and need for follow-up.  Problem List Items Addressed This Visit       Cardiovascular and Mediastinum   Essential hypertension - Primary    BP Readings from Last 3 Encounters:  07/13/23 138/88  04/28/23 (!) 104/58  03/31/23 (!) 152/74  Well-controlled hypertension  with normal blood pressure readings at home Continue valsartan HCT 80-12.5 mg daily Cardiovascular risk associated with hypertension  discussed Diet and nutrition discussed        Relevant Orders   Comprehensive metabolic panel   CBC with Differential/Platelet     Digestive   GERD (gastroesophageal reflux disease)    Clinically stable.  Asymptomatic. Continues omeprazole 20 mg daily        Endocrine   Hypothyroidism    Clinically euthyroid TSH done today Continue levothyroxine 112 mcg daily      Relevant Orders   TSH     Musculoskeletal and Integument   Primary osteoarthritis involving multiple joints    Sees rheumatologist on a regular basis Presently on methotrexate 17.5 mg weekly        Genitourinary   Stage 3a chronic kidney disease (HCC)    Stable.  Advised to stay well-hydrated and avoid NSAIDs.        Other   Dyslipidemia    Stable chronic condition Intolerant to statins Lipid profile done today Diet and nutrition discussed The 10-year ASCVD risk score (Arnett DK, et al., 2019) is: 23.9%   Values used to calculate the score:     Age: 62 years     Sex: Female     Is Non-Hispanic African American: Yes     Diabetic: No     Tobacco smoker: Yes     Systolic Blood Pressure: 138 mmHg     Is BP treated: Yes     HDL Cholesterol: 46.4 mg/dL     Total Cholesterol: 211 mg/dL       Relevant Orders   Hemoglobin A1c   Lipid panel   Current smoker    Cardiovascular and cancer risks associated with smoking discussed Smoking cessation advice given      Statin intolerance   Other Visit Diagnoses     Need for vaccination       Relevant Orders   Flu Vaccine Trivalent High Dose (Fluad)      Patient Instructions  Health Maintenance After Age 30 After age 21, you are at a higher risk for certain long-term diseases and infections as well as injuries from falls. Falls are a major cause of broken bones and head injuries in people who are older than age 26.  Getting regular preventive care can help to keep you healthy and well. Preventive care includes getting regular testing and making lifestyle changes as recommended by your health care provider. Talk with your health care provider about: Which screenings and tests you should have. A screening is a test that checks for a disease when you have no symptoms. A diet and exercise plan that is right for you. What should I know about screenings and tests to prevent falls? Screening and testing are the best ways to find a health problem early. Early diagnosis and treatment give you the best chance of managing medical conditions that are common after age 70. Certain conditions and lifestyle choices may make you more likely to have a fall. Your health care provider may recommend: Regular vision checks. Poor vision and conditions such as cataracts can make you more likely to have a fall. If you wear glasses, make sure to get your prescription updated if your vision changes. Medicine review. Work with your health care provider to regularly review all of the medicines you are taking, including over-the-counter medicines. Ask your health care provider about any side effects that may make you more likely to have a fall. Tell your health care provider if any medicines that you take make you feel dizzy or sleepy.  Strength and balance checks. Your health care provider may recommend certain tests to check your strength and balance while standing, walking, or changing positions. Foot health exam. Foot pain and numbness, as well as not wearing proper footwear, can make you more likely to have a fall. Screenings, including: Osteoporosis screening. Osteoporosis is a condition that causes the bones to get weaker and break more easily. Blood pressure screening. Blood pressure changes and medicines to control blood pressure can make you feel dizzy. Depression screening. You may be more likely to have a fall if you have a fear of  falling, feel depressed, or feel unable to do activities that you used to do. Alcohol use screening. Using too much alcohol can affect your balance and may make you more likely to have a fall. Follow these instructions at home: Lifestyle Do not drink alcohol if: Your health care provider tells you not to drink. If you drink alcohol: Limit how much you have to: 0-1 drink a day for women. 0-2 drinks a day for men. Know how much alcohol is in your drink. In the [place] one drink equals one 12 oz bottle of beer (355 mL), one 5 oz glass of wine (148 mL), or one 1 oz glass of hard liquor (44 mL). Do not use any products that contain nicotine or tobacco. These products include cigarettes, chewing tobacco, and vaping devices, such as e-cigarettes. If you need help quitting, ask your health care provider. Activity  Follow a regular exercise program to stay fit. This will help you maintain your balance. Ask your health care provider what types of exercise are appropriate for you. If you need a cane or walker, use it as recommended by your health care provider. Wear supportive shoes that have nonskid soles. Safety  Remove any tripping hazards, such as rugs, cords, and clutter. Install safety equipment such as grab bars in bathrooms and safety rails on stairs. Keep rooms and walkways well-lit. General instructions Talk with your health care provider about your risks for falling. Tell your health care provider if: You fall. Be sure to tell your health care provider about all falls, even ones that seem minor. You feel dizzy, tiredness (fatigue), or off-balance. Take over-the-counter and prescription medicines only as told by your health care provider. These include supplements. Eat a healthy diet and maintain a healthy weight. A healthy diet includes low-fat dairy products, low-fat (lean) meats, and fiber from whole grains, beans, and lots of fruits and vegetables. Stay current with your  vaccines. Schedule regular health, dental, and eye exams. Summary Having a healthy lifestyle and getting preventive care can help to protect your health and wellness after age 66. Screening and testing are the best way to find a health problem early and help you avoid having a fall. Early diagnosis and treatment give you the best chance for managing medical conditions that are more common for people who are older than age 47. Falls are a major cause of broken bones and head injuries in people who are older than age 27. Take precautions to prevent a fall at home. Work with your health care provider to learn what changes you can make to improve your health and wellness and to prevent falls. This information is not intended to replace advice given to you by your health care provider. Make sure you discuss any questions you have with your health care provider. Document Revised: 03/03/2021 Document Reviewed: 03/03/2021 Elsevier Patient Education  2024 [employer].     [place]  Alvy Bimler, MD [facility]

## 2023-07-13 NOTE — Patient Instructions (Signed)
Health Maintenance After Age 67 After age 67, you are at a higher risk for certain long-term diseases and infections as well as injuries from falls. Falls are a major cause of broken bones and head injuries in people who are older than age 67. Getting regular preventive care can help to keep you healthy and well. Preventive care includes getting regular testing and making lifestyle changes as recommended by your health care provider. Talk with your health care provider about: Which screenings and tests you should have. A screening is a test that checks for a disease when you have no symptoms. A diet and exercise plan that is right for you. What should I know about screenings and tests to prevent falls? Screening and testing are the best ways to find a health problem early. Early diagnosis and treatment give you the best chance of managing medical conditions that are common after age 67. Certain conditions and lifestyle choices may make you more likely to have a fall. Your health care provider may recommend: Regular vision checks. Poor vision and conditions such as cataracts can make you more likely to have a fall. If you wear glasses, make sure to get your prescription updated if your vision changes. Medicine review. Work with your health care provider to regularly review all of the medicines you are taking, including over-the-counter medicines. Ask your health care provider about any side effects that may make you more likely to have a fall. Tell your health care provider if any medicines that you take make you feel dizzy or sleepy. Strength and balance checks. Your health care provider may recommend certain tests to check your strength and balance while standing, walking, or changing positions. Foot health exam. Foot pain and numbness, as well as not wearing proper footwear, can make you more likely to have a fall. Screenings, including: Osteoporosis screening. Osteoporosis is a condition that causes  the bones to get weaker and break more easily. Blood pressure screening. Blood pressure changes and medicines to control blood pressure can make you feel dizzy. Depression screening. You may be more likely to have a fall if you have a fear of falling, feel depressed, or feel unable to do activities that you used to do. Alcohol use screening. Using too much alcohol can affect your balance and may make you more likely to have a fall. Follow these instructions at home: Lifestyle Do not drink alcohol if: Your health care provider tells you not to drink. If you drink alcohol: Limit how much you have to: 0-1 drink a day for women. 0-2 drinks a day for men. Know how much alcohol is in your drink. In the [place] one drink equals one 12 oz bottle of beer (355 mL), one 5 oz glass of wine (148 mL), or one 1 oz glass of hard liquor (44 mL). Do not use any products that contain nicotine or tobacco. These products include cigarettes, chewing tobacco, and vaping devices, such as e-cigarettes. If you need help quitting, ask your health care provider. Activity  Follow a regular exercise program to stay fit. This will help you maintain your balance. Ask your health care provider what types of exercise are appropriate for you. If you need a cane or walker, use it as recommended by your health care provider. Wear supportive shoes that have nonskid soles. Safety  Remove any tripping hazards, such as rugs, cords, and clutter. Install safety equipment such as grab bars in bathrooms and safety rails on stairs. Keep rooms and walkways   well-lit. General instructions Talk with your health care provider about your risks for falling. Tell your health care provider if: You fall. Be sure to tell your health care provider about all falls, even ones that seem minor. You feel dizzy, tiredness (fatigue), or off-balance. Take over-the-counter and prescription medicines only as told by your health care provider. These include  supplements. Eat a healthy diet and maintain a healthy weight. A healthy diet includes low-fat dairy products, low-fat (lean) meats, and fiber from whole grains, beans, and lots of fruits and vegetables. Stay current with your vaccines. Schedule regular health, dental, and eye exams. Summary Having a healthy lifestyle and getting preventive care can help to protect your health and wellness after age 67. Screening and testing are the best way to find a health problem early and help you avoid having a fall. Early diagnosis and treatment give you the best chance for managing medical conditions that are more common for people who are older than age 67. Falls are a major cause of broken bones and head injuries in people who are older than age 67. Take precautions to prevent a fall at home. Work with your health care provider to learn what changes you can make to improve your health and wellness and to prevent falls. This information is not intended to replace advice given to you by your health care provider. Make sure you discuss any questions you have with your health care provider. Document Revised: 03/03/2021 Document Reviewed: 03/03/2021 Elsevier Patient Education  2024 Elsevier Inc.  

## 2023-07-13 NOTE — Assessment & Plan Note (Signed)
Sees rheumatologist on a regular basis Presently on methotrexate 17.5 mg weekly

## 2023-07-13 NOTE — Assessment & Plan Note (Signed)
BP Readings from Last 3 Encounters:  07/13/23 138/88  04/28/23 (!) 104/58  03/31/23 (!) 152/74  Well-controlled hypertension with normal blood pressure readings at home Continue valsartan HCT 80-12.5 mg daily Cardiovascular risk associated with hypertension discussed Diet and nutrition discussed

## 2023-07-13 NOTE — Assessment & Plan Note (Signed)
Stable.  Advised to stay well-hydrated and avoid NSAIDs.

## 2023-07-13 NOTE — Assessment & Plan Note (Signed)
Stable chronic condition Intolerant to statins Lipid profile done today Diet and nutrition discussed The 10-year ASCVD risk score (Arnett DK, et al., 2019) is: 23.9%   Values used to calculate the score:     Age: 67 years     Sex: Female     Is Non-Hispanic African American: Yes     Diabetic: No     Tobacco smoker: Yes     Systolic Blood Pressure: 138 mmHg     Is BP treated: Yes     HDL Cholesterol: 46.4 mg/dL     Total Cholesterol: 211 mg/dL

## 2023-07-13 NOTE — Assessment & Plan Note (Signed)
Cardiovascular and cancer risks associated with smoking discussed. Smoking cessation advice given.

## 2023-07-13 NOTE — Assessment & Plan Note (Signed)
Clinically euthyroid.  TSH done today. Continue levothyroxine 112 mcg daily.

## 2023-07-13 NOTE — Assessment & Plan Note (Signed)
Clinically stable.  Asymptomatic. Continues omeprazole 20 mg daily

## 2023-07-14 DIAGNOSIS — M0579 Rheumatoid arthritis with rheumatoid factor of multiple sites without organ or systems involvement: Secondary | ICD-10-CM | POA: Diagnosis not present

## 2023-07-14 DIAGNOSIS — M79643 Pain in unspecified hand: Secondary | ICD-10-CM | POA: Diagnosis not present

## 2023-07-14 DIAGNOSIS — M25561 Pain in right knee: Secondary | ICD-10-CM | POA: Diagnosis not present

## 2023-07-14 DIAGNOSIS — Z79899 Other long term (current) drug therapy: Secondary | ICD-10-CM | POA: Diagnosis not present

## 2023-07-14 DIAGNOSIS — M1990 Unspecified osteoarthritis, unspecified site: Secondary | ICD-10-CM | POA: Diagnosis not present

## 2023-07-23 DIAGNOSIS — H25812 Combined forms of age-related cataract, left eye: Secondary | ICD-10-CM | POA: Diagnosis not present

## 2023-08-09 DIAGNOSIS — H5203 Hypermetropia, bilateral: Secondary | ICD-10-CM | POA: Diagnosis not present

## 2023-08-09 DIAGNOSIS — H52209 Unspecified astigmatism, unspecified eye: Secondary | ICD-10-CM | POA: Diagnosis not present

## 2023-08-09 DIAGNOSIS — H524 Presbyopia: Secondary | ICD-10-CM | POA: Diagnosis not present

## 2023-08-19 ENCOUNTER — Ambulatory Visit: Payer: Medicare HMO | Admitting: Emergency Medicine

## 2023-08-19 ENCOUNTER — Encounter: Payer: Self-pay | Admitting: Emergency Medicine

## 2023-08-19 VITALS — BP 132/86 | HR 89 | Temp 98.4°F | Ht 67.0 in | Wt 195.2 lb

## 2023-08-19 DIAGNOSIS — R0989 Other specified symptoms and signs involving the circulatory and respiratory systems: Secondary | ICD-10-CM | POA: Insufficient documentation

## 2023-08-19 DIAGNOSIS — J22 Unspecified acute lower respiratory infection: Secondary | ICD-10-CM | POA: Diagnosis not present

## 2023-08-19 MED ORDER — AZITHROMYCIN 250 MG PO TABS
ORAL_TABLET | ORAL | 0 refills | Status: DC
Start: 2023-08-19 — End: 2024-04-17

## 2023-08-19 NOTE — Progress Notes (Signed)
Erika Cervantes 67 y.o.   Chief Complaint  Patient presents with   Cough    Patient states she gets hoarse at night, coughing up mucus, chest congestion     HISTORY OF PRESENT ILLNESS: Acute problem visit today. This is a 67 y.o. female complaining of flulike symptoms that started about 3 to 4 days ago with persistent productive cough Has history of pneumonia.  Denies fever or chills.  Denies difficulty breathing.  Denies chest pain or any other associated symptoms.  Cough Pertinent negatives include no chest pain, chills, fever, headaches or rash.     Prior to Admission medications   Medication Sig Start Date End Date Taking? Authorizing Provider  acetaminophen (TYLENOL) 650 MG CR tablet Take 650 mg by mouth every 8 (eight) hours as needed for pain.   Yes [provider]  ELDERBERRY PO Take 1 capsule by mouth daily.   Yes [provider]  ezetimibe (ZETIA) 10 MG tablet Take 1 tablet (10 mg total) by mouth daily. 07/13/23  Yes Irene Collings, Eilleen Kempf, MD  folic acid (FOLVITE) 1 MG tablet Take 1 mg by mouth daily.   Yes [provider]  levothyroxine (SYNTHROID) 125 MCG tablet Take 1 tablet (125 mcg total) by mouth daily before breakfast. 07/13/23  Yes Sae Handrich, Eilleen Kempf, MD  loratadine (CLARITIN) 10 MG tablet Take 10 mg by mouth daily as needed for allergies.   Yes [provider]  methotrexate (RHEUMATREX) 2.5 MG tablet Take 17.5 mg by mouth every Wednesday. 12/09/22  Yes [provider]  Misc Natural Products (CHOLESTEROL SUPPORT PO) Take 1 capsule by mouth daily.   Yes [provider]  ofloxacin (OCUFLOX) 0.3 % ophthalmic solution  07/06/23  Yes [provider]  omeprazole (PRILOSEC) 20 MG capsule TAKE 1 CAPSULE BY MOUTH DAILY 07/04/23  Yes Rydge Texidor, Eilleen Kempf, MD  tiZANidine (ZANAFLEX) 4 MG tablet Take 1 tablet (4 mg total) by mouth every 6 (six) hours as needed for muscle spasms. 04/28/23  Yes Coletta Memos, MD  trolamine  salicylate (ASPERCREME) 10 % cream Apply 1 Application topically as needed for muscle pain.   Yes [provider]  TURMERIC PO Take 1 capsule by mouth daily.   Yes [provider]  valsartan-hydrochlorothiazide (DIOVAN-HCT) 80-12.5 MG tablet TAKE 1 TABLET BY MOUTH DAILY 06/28/23  Yes Brittane Grudzinski, Eilleen Kempf, MD  albuterol (PROVENTIL HFA;VENTOLIN HFA) 108 (90 Base) MCG/ACT inhaler Inhale 2 puffs into the lungs every 6 (six) hours as needed. Patient not taking: Reported on 03/26/2023 12/07/17   Wallis Bamberg, PA-C  azithromycin [facility]) 250 MG tablet Sig as indicated Patient not taking: Reported on 07/13/2023 01/05/23   Georgina Quint, MD  colchicine 0.6 MG tablet Take 1 tablet (0.6 mg total) by mouth daily as needed. Patient not taking: Reported on 08/19/2023 09/11/22   Fuller Plan, MD  HYDROcodone bit-homatropine (HYCODAN) 5-1.5 MG/5ML syrup Take 5 mLs by mouth at bedtime as needed for cough. Patient not taking: Reported on 03/26/2023 01/05/23   Georgina Quint, MD  prednisoLONE acetate (PRED FORTE) 1 % ophthalmic suspension SMARTSIG:In Eye(s) 07/06/23   [provider]  rosuvastatin (CRESTOR) 20 MG tablet Take 1 tablet (20 mg total) by mouth daily. Patient not taking: Reported on 07/13/2023 01/05/23   Georgina Quint, MD    Allergies  Allergen Reactions   Darvocet [Propoxyphene N-Acetaminophen] Nausea And Vomiting    Patient Active Problem List   Diagnosis Date Noted   Statin intolerance 07/13/2023   Spondylolisthesis  of lumbar region 04/27/2023   Chronic joint pain 09/16/2022   Positive colorectal cancer screening using Cologuard test 07/07/2022   Stage 3a chronic kidney disease (HCC) 01/08/2022   Current smoker 01/06/2022   Bilateral hand pain 08/28/2021   Positive ANA (antinuclear antibody) 08/28/2021   Inflammatory arthritis 08/28/2021   Primary osteoarthritis involving multiple joints 07/08/2021   Dyslipidemia 08/01/2019   Degenerative  arthritis of thumb 09/04/2015   Vitamin D deficiency 05/26/2012   Hypothyroidism 02/26/2012   Hyperlipidemia with target LDL less than 100 02/26/2012   Essential hypertension 02/26/2012   Obesity (BMI 30.0-34.9) 02/26/2012   Bulging lumbar disc 02/26/2012   GERD (gastroesophageal reflux disease) 02/26/2012    Past Medical History:  Diagnosis Date   Allergy    Arthritis    GERD (gastroesophageal reflux disease)    High cholesterol    Hypertension    Hypothyroidism    PONV (postoperative nausea and vomiting)    Thyroid disease     Past Surgical History:  Procedure Laterality Date   ABDOMINAL HYSTERECTOMY  late 1980s   Fibroids were indication.  pt says also had SPO bil.     APPENDECTOMY  late 1980s   done at time of hysto.     BACK SURGERY     BREAST SURGERY     Right breast mass   ESOPHAGOGASTRODUODENOSCOPY (EGD) WITH PROPOFOL N/A 11/08/2018   Procedure: ESOPHAGOGASTRODUODENOSCOPY (EGD) WITH PROPOFOL;  Surgeon: Rachael Fee, MD;  Location: [facility] ENDOSCOPY;  Service: Endoscopy;  Laterality: N/A;   Hammer toes     TONSILLECTOMY      Social History   Socioeconomic History   Marital status: Married    Spouse name: Not on file   Number of children: 1   Years of education: Not on file   Highest education level: Not on file  Occupational History   Not on file  Tobacco Use   Smoking status: Every Day    Current packs/day: 0.10    Average packs/day: 0.1 packs/day for 22.0 years (2.2 ttl pk-yrs)    Types: Cigarettes   Smokeless tobacco: Never   Tobacco comments:    3 cigs daiy  Vaping Use   Vaping status: Never Used  Substance and Sexual Activity   Alcohol use: No   Drug use: No   Sexual activity: Not on file  Other Topics Concern   Not on file  Social History Narrative   1 biological child. 2 stepchildren   Social Determinants of Health   Financial Resource Strain: Low Risk  (01/12/2023)   Overall Financial Resource Strain (CARDIA)    Difficulty of Paying  Living Expenses: Not hard at all  Food Insecurity: No Food Insecurity (04/27/2023)   Hunger Vital Sign    Worried About Running Out of Food in the Last Year: Never true    Ran Out of Food in the Last Year: Never true  Transportation Needs: No Transportation Needs (04/27/2023)   PRAPARE - Administrator, Civil Service (Medical): No    Lack of Transportation (Non-Medical): No  Physical Activity: Insufficiently Active (01/12/2023)   Exercise Vital Sign    Days of Exercise per Week: 3 days    Minutes of Exercise per Session: 30 min  Stress: No Stress Concern Present (01/12/2023)   [employer] of Occupational Health - Occupational Stress Questionnaire    Feeling of Stress : Not at all  Social Connections: Socially Integrated (01/12/2023)   Social Connection and Isolation Panel [NHANES]  Frequency of Communication with Friends and Family: More than three times a week    Frequency of Social Gatherings with Friends and Family: More than three times a week    Attends Religious Services: More than 4 times per year    Active Member of [employer] or Organizations: Yes    Attends Engineer, structural: More than 4 times per year    Marital Status: Married  Catering manager Violence: Not At Risk (04/27/2023)   Humiliation, Afraid, Rape, and Kick questionnaire    Fear of Current or Ex-Partner: No    Emotionally Abused: No    Physically Abused: No    Sexually Abused: No    Family History  Problem Relation Age of Onset   Heart disease Mother    Rheum arthritis Mother    Colon cancer Neg Hx    Esophageal cancer Neg Hx    Stomach cancer Neg Hx    Rectal cancer Neg Hx      Review of Systems  Constitutional: Negative.  Negative for chills and fever.  HENT:  Positive for congestion.   Respiratory:  Positive for cough.   Cardiovascular: Negative.  Negative for chest pain and palpitations.  Gastrointestinal:  Negative for abdominal pain, nausea and vomiting.  Genitourinary:  Negative.  Negative for dysuria and hematuria.  Skin: Negative.  Negative for rash.  Neurological: Negative.  Negative for dizziness and headaches.  All other systems reviewed and are negative.   Today's Vitals   08/19/23 1345  BP: 132/86  Pulse: 89  Temp: 98.4 F (36.9 C)  TempSrc: Oral  SpO2: 99%  Weight: 195 lb 4 oz (88.6 kg)  Height: 5\' 7"  (1.702 m)   Body mass index is 30.58 kg/m.   Physical Exam Vitals reviewed.  Constitutional:      Appearance: Normal appearance.  HENT:     Head: Normocephalic.     Mouth/Throat:     Mouth: Mucous membranes are moist.     Pharynx: Oropharynx is clear.  Eyes:     Extraocular Movements: Extraocular movements intact.     Pupils: Pupils are equal, round, and reactive to light.  Cardiovascular:     Rate and Rhythm: Normal rate and regular rhythm.     Pulses: Normal pulses.     Heart sounds: Normal heart sounds.  Pulmonary:     Effort: Pulmonary effort is normal.     Breath sounds: Normal breath sounds.  Skin:    General: Skin is warm and dry.  Neurological:     Mental Status: She is alert and oriented to person, place, and time.  Psychiatric:        Mood and Affect: Mood normal.        Behavior: Behavior normal.      ASSESSMENT & PLAN: A total of 32 minutes was spent with the patient and counseling/coordination of care regarding preparing for this visit, review of most recent office visit notes, review of chronic medical conditions under management, review of all medications, diagnosis of lower respiratory tract infection and need for antibiotics, prognosis, ED precautions, documentation and need for follow-up if no better or worse during the next several days.  Problem List Items Addressed This Visit       Respiratory   Lower respiratory infection - Primary    Clinically stable.  No signs of pneumonia. Lungs are clear without crackles or wheezing. Productive cough getting worse.  History of pneumonia.  Smoker. Recommend  daily azithromycin for 5 days. Advised  to contact the office if no better or worse during the next several days      Relevant Medications   azithromycin (ZITHROMAX) 250 MG tablet   Chest congestion    Advised to rest and stay well-hydrated Continue over-the-counter Mucinex DM      Patient Instructions  Acute Bronchitis, Adult  Acute bronchitis is when air tubes in the lungs (bronchi) suddenly get swollen. The condition can make it hard for you to breathe. In adults, acute bronchitis usually goes away within 2 weeks. A cough caused by bronchitis may last up to 3 weeks. Smoking, allergies, and asthma can make the condition worse. What are the causes? Germs that cause cold and flu (viruses). The most common cause of this condition is the virus that causes the common cold. Bacteria. Substances that bother (irritate) the lungs, including: Smoke from cigarettes and other types of tobacco. Dust and pollen. Fumes from chemicals, gases, or burned fuel. Indoor or outdoor air pollution. What increases the risk? A weak body's defense system. This is also called the immune system. Any condition that affects your lungs and breathing, such as asthma. What are the signs or symptoms? A cough. Coughing up clear, yellow, or green mucus. Making high-pitched whistling sounds when you breathe, most often when you breathe out (wheezing). Runny or stuffy nose. Having too much mucus in your lungs (chest congestion). Shortness of breath. Body aches. A sore throat. How is this treated? Acute bronchitis may go away over time without treatment. Your doctor may tell you to: Drink more fluids. This will help thin your mucus so it is easier to cough up. Use a device that gets medicine into your lungs (inhaler). Use a vaporizer or a humidifier. These are machines that add water to the air. This helps with coughing and poor breathing. Take a medicine that thins mucus and helps clear it from your lungs. Take  a medicine that prevents or stops coughing. It is not common to take an antibiotic medicine for this condition. Follow these instructions at home:  Take over-the-counter and prescription medicines only as told by your doctor. Use an inhaler, vaporizer, or humidifier as told by your doctor. Take two teaspoons (10 mL) of honey at bedtime. This helps lessen your coughing at night. Drink enough fluid to keep your pee (urine) pale yellow. Do not smoke or use any products that contain nicotine or tobacco. If you need help quitting, ask your doctor. Get a lot of rest. Return to your normal activities when your doctor says that it is safe. Keep all follow-up visits. How is this prevented?  Wash your hands often with soap and water for at least 20 seconds. If you cannot use soap and water, use hand sanitizer. Avoid contact with people who have cold symptoms. Try not to touch your mouth, nose, or eyes with your hands. Avoid breathing in smoke or chemical fumes. Make sure to get the flu shot every year. Contact a doctor if: Your symptoms do not get better in 2 weeks. You have trouble coughing up the mucus. Your cough keeps you awake at night. You have a fever. Get help right away if: You cough up blood. You have chest pain. You have very bad shortness of breath. You faint or keep feeling like you are going to faint. You have a very bad headache. Your fever or chills get worse. These symptoms may be an emergency. Get help right away. Call your local emergency services (911 in the [place] Do not  wait to see if the symptoms will go away. Do not drive yourself to the hospital. Summary Acute bronchitis is when air tubes in the lungs (bronchi) suddenly get swollen. In adults, acute bronchitis usually goes away within 2 weeks. Drink more fluids. This will help thin your mucus so it is easier to cough up. Take over-the-counter and prescription medicines only as told by your doctor. Contact a doctor  if your symptoms do not improve after 2 weeks of treatment. This information is not intended to replace advice given to you by your health care provider. Make sure you discuss any questions you have with your health care provider. Document Revised: 02/12/2021 Document Reviewed: 02/12/2021 Elsevier Patient Education  2024 Elsevier Inc.    Edwina Barth, MD [facility]

## 2023-08-19 NOTE — Assessment & Plan Note (Signed)
Clinically stable.  No signs of pneumonia. Lungs are clear without crackles or wheezing. Productive cough getting worse.  History of pneumonia.  Smoker. Recommend daily azithromycin for 5 days. Advised to contact the office if no better or worse during the next several days

## 2023-08-19 NOTE — Assessment & Plan Note (Signed)
Advised to rest and stay well-hydrated Continue over-the-counter Mucinex DM

## 2023-08-19 NOTE — Patient Instructions (Signed)
Acute Bronchitis, Adult  Acute bronchitis is when air tubes in the lungs (bronchi) suddenly get swollen. The condition can make it hard for you to breathe. In adults, acute bronchitis usually goes away within 2 weeks. A cough caused by bronchitis may last up to 3 weeks. Smoking, allergies, and asthma can make the condition worse. What are the causes? Germs that cause cold and flu (viruses). The most common cause of this condition is the virus that causes the common cold. Bacteria. Substances that bother (irritate) the lungs, including: Smoke from cigarettes and other types of tobacco. Dust and pollen. Fumes from chemicals, gases, or burned fuel. Indoor or outdoor air pollution. What increases the risk? A weak body's defense system. This is also called the immune system. Any condition that affects your lungs and breathing, such as asthma. What are the signs or symptoms? A cough. Coughing up clear, yellow, or green mucus. Making high-pitched whistling sounds when you breathe, most often when you breathe out (wheezing). Runny or stuffy nose. Having too much mucus in your lungs (chest congestion). Shortness of breath. Body aches. A sore throat. How is this treated? Acute bronchitis may go away over time without treatment. Your doctor may tell you to: Drink more fluids. This will help thin your mucus so it is easier to cough up. Use a device that gets medicine into your lungs (inhaler). Use a vaporizer or a humidifier. These are machines that add water to the air. This helps with coughing and poor breathing. Take a medicine that thins mucus and helps clear it from your lungs. Take a medicine that prevents or stops coughing. It is not common to take an antibiotic medicine for this condition. Follow these instructions at home:  Take over-the-counter and prescription medicines only as told by your doctor. Use an inhaler, vaporizer, or humidifier as told by your doctor. Take two teaspoons  (10 mL) of honey at bedtime. This helps lessen your coughing at night. Drink enough fluid to keep your pee (urine) pale yellow. Do not smoke or use any products that contain nicotine or tobacco. If you need help quitting, ask your doctor. Get a lot of rest. Return to your normal activities when your doctor says that it is safe. Keep all follow-up visits. How is this prevented?  Wash your hands often with soap and water for at least 20 seconds. If you cannot use soap and water, use hand sanitizer. Avoid contact with people who have cold symptoms. Try not to touch your mouth, nose, or eyes with your hands. Avoid breathing in smoke or chemical fumes. Make sure to get the flu shot every year. Contact a doctor if: Your symptoms do not get better in 2 weeks. You have trouble coughing up the mucus. Your cough keeps you awake at night. You have a fever. Get help right away if: You cough up blood. You have chest pain. You have very bad shortness of breath. You faint or keep feeling like you are going to faint. You have a very bad headache. Your fever or chills get worse. These symptoms may be an emergency. Get help right away. Call your local emergency services (911 in the [place] Do not wait to see if the symptoms will go away. Do not drive yourself to the hospital. Summary Acute bronchitis is when air tubes in the lungs (bronchi) suddenly get swollen. In adults, acute bronchitis usually goes away within 2 weeks. Drink more fluids. This will help thin your mucus so it is easier  to cough up. Take over-the-counter and prescription medicines only as told by your doctor. Contact a doctor if your symptoms do not improve after 2 weeks of treatment. This information is not intended to replace advice given to you by your health care provider. Make sure you discuss any questions you have with your health care provider. Document Revised: 02/12/2021 Document Reviewed: 02/12/2021 Elsevier Patient  Education  2024 [employer].

## 2023-09-13 DIAGNOSIS — M4316 Spondylolisthesis, lumbar region: Secondary | ICD-10-CM | POA: Diagnosis not present

## 2023-09-13 DIAGNOSIS — Z6829 Body mass index (BMI) 29.0-29.9, adult: Secondary | ICD-10-CM | POA: Diagnosis not present

## 2023-09-14 DIAGNOSIS — H35353 Cystoid macular degeneration, bilateral: Secondary | ICD-10-CM | POA: Diagnosis not present

## 2023-11-09 DIAGNOSIS — M79643 Pain in unspecified hand: Secondary | ICD-10-CM | POA: Diagnosis not present

## 2023-11-09 DIAGNOSIS — Z23 Encounter for immunization: Secondary | ICD-10-CM | POA: Diagnosis not present

## 2023-11-09 DIAGNOSIS — M0579 Rheumatoid arthritis with rheumatoid factor of multiple sites without organ or systems involvement: Secondary | ICD-10-CM | POA: Diagnosis not present

## 2023-11-09 DIAGNOSIS — E039 Hypothyroidism, unspecified: Secondary | ICD-10-CM | POA: Diagnosis not present

## 2023-11-09 DIAGNOSIS — Z79899 Other long term (current) drug therapy: Secondary | ICD-10-CM | POA: Diagnosis not present

## 2023-11-09 DIAGNOSIS — M1990 Unspecified osteoarthritis, unspecified site: Secondary | ICD-10-CM | POA: Diagnosis not present

## 2023-12-27 ENCOUNTER — Other Ambulatory Visit: Payer: Self-pay | Admitting: Emergency Medicine

## 2023-12-27 DIAGNOSIS — K219 Gastro-esophageal reflux disease without esophagitis: Secondary | ICD-10-CM

## 2024-01-04 ENCOUNTER — Encounter: Payer: Self-pay | Admitting: Family Medicine

## 2024-01-04 ENCOUNTER — Ambulatory Visit (INDEPENDENT_AMBULATORY_CARE_PROVIDER_SITE_OTHER): Admitting: Family Medicine

## 2024-01-04 VITALS — BP 134/72 | HR 65 | Temp 97.6°F | Ht 67.0 in | Wt 188.0 lb

## 2024-01-04 DIAGNOSIS — J019 Acute sinusitis, unspecified: Secondary | ICD-10-CM

## 2024-01-04 DIAGNOSIS — R6889 Other general symptoms and signs: Secondary | ICD-10-CM

## 2024-01-04 DIAGNOSIS — R5383 Other fatigue: Secondary | ICD-10-CM

## 2024-01-04 DIAGNOSIS — R051 Acute cough: Secondary | ICD-10-CM

## 2024-01-04 MED ORDER — ALBUTEROL SULFATE HFA 108 (90 BASE) MCG/ACT IN AERS: 2.0000 | INHALATION_SPRAY | Freq: Four times a day (QID) | RESPIRATORY_TRACT | 0 refills | Status: AC | PRN

## 2024-01-04 MED ORDER — AMOXICILLIN-POT CLAVULANATE 875-125 MG PO TABS: 1.0000 | ORAL_TABLET | Freq: Two times a day (BID) | ORAL | 0 refills | Status: DC

## 2024-01-04 MED ORDER — BENZONATATE 200 MG PO CAPS: 200.0000 mg | ORAL_CAPSULE | Freq: Two times a day (BID) | ORAL | 0 refills | Status: AC | PRN

## 2024-01-04 NOTE — Patient Instructions (Signed)
 Take the antibiotic as prescribed.  Take the [facility] for cough.  I also recommend over-the-counter Mucinex or Robitussin for cough and congestion. Continue Tylenol as needed for headache, sinus pain and sore throat.  Use the albuterol inhaler as needed.

## 2024-01-04 NOTE — Progress Notes (Signed)
 Subjective:  Erika Cervantes is a 68 y.o. female who presents for a 2 wk hx of fatigue, fever (101), chills, body aches,sinus pain, congestion, purulent nasal drainage, watery eyes, chest tightness with cough, and sneezing.   Taking Tylenol, Claritin,   Smokes.   Denies hx of asthma, COPD.   Pneumonia in the past.   States she has used an albuterol inhaler in the past when she felt like this but does not have one   ROS as in subjective.   Objective: Vitals:   01/04/24 0933  BP: 134/72  Pulse: 65  Temp: 97.6 F (36.4 C)  SpO2: 100%    General appearance: Alert, WD/WN, no distress, mildly ill appearing                             Skin: warm, no rash                           Head: + maxillary sinus tenderness                            Eyes: conjunctiva normal, corneas clear, PERRLA                            Ears: pearly TMs, external ear canals normal                          Nose: septum midline, turbinates swollen, with erythema              Mouth/throat: MMM, tongue normal, mild pharyngeal erythema                           Neck: supple, no adenopathy, no thyromegaly, nontender                          Heart: RRR                         Lungs: CTA bilaterally, no wheezes, rales, or rhonchi      Assessment: Acute sinusitis with symptoms > 10 days  Flu-like symptoms  Acute cough  Fatigue, unspecified type   Plan: 2 wk course of illness, mod symptoms. Suspect viral illness now bacterial. Augmentin prescribed. Refill albuterol. Tessalon prescribed.  Suggested symptomatic OTC remedies. Nasal saline spray for congestion.  Tylenol or Ibuprofen OTC for fever and malaise.   Call/return if symptoms aren't resolving in 3-4 days

## 2024-01-18 ENCOUNTER — Ambulatory Visit (INDEPENDENT_AMBULATORY_CARE_PROVIDER_SITE_OTHER): Payer: No Typology Code available for payment source

## 2024-01-18 ENCOUNTER — Telehealth: Payer: Self-pay

## 2024-01-18 ENCOUNTER — Encounter: Payer: Self-pay | Admitting: Emergency Medicine

## 2024-01-18 ENCOUNTER — Ambulatory Visit (INDEPENDENT_AMBULATORY_CARE_PROVIDER_SITE_OTHER): Admitting: Emergency Medicine

## 2024-01-18 ENCOUNTER — Other Ambulatory Visit: Payer: Self-pay | Admitting: Emergency Medicine

## 2024-01-18 VITALS — BP 138/78 | HR 70 | Temp 98.3°F | Ht 67.0 in | Wt 189.0 lb

## 2024-01-18 VITALS — Ht 67.0 in | Wt 189.0 lb

## 2024-01-18 DIAGNOSIS — Z13228 Encounter for screening for other metabolic disorders: Secondary | ICD-10-CM

## 2024-01-18 DIAGNOSIS — Z0000 Encounter for general adult medical examination without abnormal findings: Secondary | ICD-10-CM | POA: Diagnosis not present

## 2024-01-18 DIAGNOSIS — Z130 Encounter for screening for diseases of the blood and blood-forming organs and certain disorders involving the immune mechanism: Secondary | ICD-10-CM

## 2024-01-18 DIAGNOSIS — M150 Primary generalized (osteo)arthritis: Secondary | ICD-10-CM | POA: Diagnosis not present

## 2024-01-18 DIAGNOSIS — Z1329 Encounter for screening for other suspected endocrine disorder: Secondary | ICD-10-CM | POA: Diagnosis not present

## 2024-01-18 DIAGNOSIS — I10 Essential (primary) hypertension: Secondary | ICD-10-CM | POA: Diagnosis not present

## 2024-01-18 DIAGNOSIS — E876 Hypokalemia: Secondary | ICD-10-CM

## 2024-01-18 DIAGNOSIS — K219 Gastro-esophageal reflux disease without esophagitis: Secondary | ICD-10-CM | POA: Diagnosis not present

## 2024-01-18 DIAGNOSIS — F17200 Nicotine dependence, unspecified, uncomplicated: Secondary | ICD-10-CM | POA: Diagnosis not present

## 2024-01-18 DIAGNOSIS — E039 Hypothyroidism, unspecified: Secondary | ICD-10-CM

## 2024-01-18 DIAGNOSIS — Z0001 Encounter for general adult medical examination with abnormal findings: Secondary | ICD-10-CM

## 2024-01-18 DIAGNOSIS — N1831 Chronic kidney disease, stage 3a: Secondary | ICD-10-CM

## 2024-01-18 DIAGNOSIS — E785 Hyperlipidemia, unspecified: Secondary | ICD-10-CM

## 2024-01-18 LAB — COMPREHENSIVE METABOLIC PANEL
ALT: 12 U/L (ref 0–35)
AST: 17 U/L (ref 0–37)
Albumin: 3.9 g/dL (ref 3.5–5.2)
Alkaline Phosphatase: 47 U/L (ref 39–117)
BUN: 14 mg/dL (ref 6–23)
CO2: 29 meq/L (ref 19–32)
Calcium: 8.8 mg/dL (ref 8.4–10.5)
Chloride: 105 meq/L (ref 96–112)
Creatinine, Ser: 1.05 mg/dL (ref 0.40–1.20)
GFR: 54.95 mL/min — ABNORMAL LOW (ref >60.00)
Glucose, Bld: 70 mg/dL (ref 70–99)
Potassium: 2.7 meq/L — CL (ref 3.5–5.1)
Sodium: 142 meq/L (ref 135–145)
Total Bilirubin: 0.8 mg/dL (ref 0.2–1.2)
Total Protein: 6.8 g/dL (ref 6.0–8.3)

## 2024-01-18 LAB — LIPID PANEL
Cholesterol: 197 mg/dL (ref 0–200)
HDL: 43.1 mg/dL (ref >39.00)
LDL Cholesterol: 130 mg/dL — ABNORMAL HIGH (ref 0–99)
NonHDL: 153.66
Total CHOL/HDL Ratio: 5
Triglycerides: 116 mg/dL (ref 0.0–149.0)
VLDL: 23.2 mg/dL (ref 0.0–40.0)

## 2024-01-18 LAB — CBC WITH DIFFERENTIAL/PLATELET
Basophils Absolute: 0.1 10*3/uL (ref 0.0–0.1)
Basophils Relative: 0.9 % (ref 0.0–3.0)
Eosinophils Absolute: 0.2 10*3/uL (ref 0.0–0.7)
Eosinophils Relative: 2.7 % (ref 0.0–5.0)
HCT: 35.2 % — ABNORMAL LOW (ref 36.0–46.0)
Hemoglobin: 11.9 g/dL — ABNORMAL LOW (ref 12.0–15.0)
Lymphocytes Relative: 27.1 % (ref 12.0–46.0)
Lymphs Abs: 1.8 10*3/uL (ref 0.7–4.0)
MCHC: 33.6 g/dL (ref 30.0–36.0)
MCV: 86.2 fl (ref 78.0–100.0)
Monocytes Absolute: 0.6 10*3/uL (ref 0.1–1.0)
Monocytes Relative: 9.4 % (ref 3.0–12.0)
Neutro Abs: 4 10*3/uL (ref 1.4–7.7)
Neutrophils Relative %: 59.9 % (ref 43.0–77.0)
Platelets: 288 10*3/uL (ref 150.0–400.0)
RBC: 4.09 Mil/uL (ref 3.87–5.11)
RDW: 14.5 % (ref 11.5–15.5)
WBC: 6.7 10*3/uL (ref 4.0–10.5)

## 2024-01-18 LAB — HEMOGLOBIN A1C: Hgb A1c MFr Bld: 5.8 % (ref 4.6–6.5)

## 2024-01-18 LAB — MICROALBUMIN / CREATININE URINE RATIO
Creatinine,U: 185.3 mg/dL
Microalb Creat Ratio: 6.2 mg/g (ref 0.0–30.0)
Microalb, Ur: 1.1 mg/dL (ref 0.0–1.9)

## 2024-01-18 LAB — TSH: TSH: 0.7 u[IU]/mL (ref 0.35–5.50)

## 2024-01-18 MED ORDER — VALSARTAN-HYDROCHLOROTHIAZIDE 160-12.5 MG PO TABS
1.0000 | ORAL_TABLET | Freq: Every day | ORAL | 3 refills | Status: DC
Start: 2024-01-18 — End: 2024-07-18

## 2024-01-18 MED ORDER — EMPAGLIFLOZIN 10 MG PO TABS: 10.0000 mg | ORAL_TABLET | Freq: Every day | ORAL | 5 refills | Status: DC

## 2024-01-18 MED ORDER — POTASSIUM CHLORIDE CRYS ER 20 MEQ PO TBCR
20.0000 meq | EXTENDED_RELEASE_TABLET | Freq: Every day | ORAL | 3 refills | Status: DC
Start: 2024-01-18 — End: 2024-05-13

## 2024-01-18 NOTE — Assessment & Plan Note (Signed)
 BP Readings from Last 3 Encounters:  01/18/24 138/78  01/04/24 134/72  08/19/23 132/86  Elevated blood pressure readings in the office and at home. Recommend to increase valsartan HCT to 160-12.5 mg daily Cardiovascular risks associated with hypertension discussed. Diet and nutrition discussed.

## 2024-01-18 NOTE — Patient Instructions (Signed)

## 2024-01-18 NOTE — Patient Instructions (Signed)
 Ms. Erika Cervantes , Thank you for taking time to come for your Medicare Wellness Visit. I appreciate your ongoing commitment to your health goals. Please review the following plan we discussed and let me know if I can assist you in the future.   Referrals/Orders/Follow-Ups/Clinician Recommendations: It was nice talking with you today.  You are due for a Tetanus vaccine and a Shingles vaccine.  Each day, aim for 6 glasses of water, plenty of protein in your diet and try to get up and walk/ stretch every hour for 5-10 minutes at a time.    This is a list of the screening recommended for you and due dates:  Health Maintenance  Topic Date Due   Zoster (Shingles) Vaccine (1 of 2) Never done   Medicare Annual Wellness Visit  01/12/2024   COVID-19 Vaccine (5 - Pfizer risk 2024-25 season) 05/08/2024   Mammogram  11/27/2024   Colon Cancer Screening  09/08/2025   Pneumonia Vaccine  Completed   Flu Shot  Completed   DEXA scan (bone density measurement)  Completed   Hepatitis C Screening  Completed   HPV Vaccine  Aged Out   DTaP/Tdap/Td vaccine  Discontinued    Advanced directives: (Declined) Advance directive discussed with you today. Even though you declined this today, please call our office should you change your mind, and we can give you the proper paperwork for you to fill out.  Next Medicare Annual Wellness Visit scheduled for next year: Yes

## 2024-01-18 NOTE — Assessment & Plan Note (Signed)
 Stable.  Advised to stay well-hydrated and avoid NSAIDs. Recommend to start Jardiance or Farxiga to avoid further kidney function deterioration

## 2024-01-18 NOTE — Progress Notes (Signed)
 Erika Cervantes 68 y.o.   Chief Complaint  Patient presents with   Annual Exam    Patient here for physical. She mentions her energy level has been down lately doesn't know if related with her arthritis. She also states when she takes Zetia it hurts her joints so she has stopped taking.  Also mentions she is still have sinus congestion since her last visit with Vickie NP    HISTORY OF PRESENT ILLNESS: This is a 68 y.o. female here for annual exam and follow-up on chronic medical conditions Complaining of lack of energy and general malaise Still dealing with sinus congestion for the last couple weeks Stopped Zetia due to joint pains No other complaints or medical concerns today.  HPI   Prior to Admission medications   Medication Sig Start Date End Date Taking? Authorizing Provider  acetaminophen (TYLENOL) 650 MG CR tablet Take 650 mg by mouth every 8 (eight) hours as needed for pain.   Yes [provider]  albuterol (VENTOLIN HFA) 108 (90 Base) MCG/ACT inhaler Inhale 2 puffs into the lungs every 6 (six) hours as needed. 01/04/24  Yes Henson, Vickie L, NP-C  ELDERBERRY PO Take 1 capsule by mouth daily.   Yes [provider]  folic acid (FOLVITE) 1 MG tablet Take 1 mg by mouth daily.   Yes [provider]  levothyroxine (SYNTHROID) 125 MCG tablet Take 1 tablet (125 mcg total) by mouth daily before breakfast. 07/13/23  Yes Cristy Colmenares, Eilleen Kempf, MD  loratadine (CLARITIN) 10 MG tablet Take 10 mg by mouth daily as needed for allergies.   Yes [provider]  methotrexate (RHEUMATREX) 2.5 MG tablet Take 17.5 mg by mouth every Wednesday. 12/09/22  Yes [provider]  Misc Natural Products (CHOLESTEROL SUPPORT PO) Take 1 capsule by mouth daily.   Yes [provider]  ofloxacin (OCUFLOX) 0.3 % ophthalmic solution  07/06/23  Yes [provider]  omeprazole (PRILOSEC) 20 MG capsule TAKE 1 CAPSULE BY MOUTH DAILY 12/27/23  Yes Daejah Klebba, Eilleen Kempf, MD  prednisoLONE acetate (PRED FORTE) 1 % ophthalmic suspension SMARTSIG:In Eye(s) 07/06/23  Yes [provider]  trolamine salicylate (ASPERCREME) 10 % cream Apply 1 Application topically as needed for muscle pain.   Yes [provider]  TURMERIC PO Take 1 capsule by mouth daily.   Yes [provider]  valsartan-hydrochlorothiazide (DIOVAN-HCT) 80-12.5 MG tablet TAKE 1 TABLET BY MOUTH DAILY 06/28/23  Yes Tyeisha Dinan, Eilleen Kempf, MD  amoxicillin-clavulanate (AUGMENTIN) 875-125 MG tablet Take 1 tablet by mouth 2 (two) times daily. Patient not taking: Reported on 01/18/2024 01/04/24   Avanell Shackleton, NP-C  azithromycin (ZITHROMAX) 250 MG tablet Sig as indicated Patient not taking: Reported on 01/18/2024 08/19/23   Georgina Quint, MD  benzonatate (TESSALON) 200 MG capsule Take 1 capsule (200 mg total) by mouth 2 (two) times daily as needed for cough. Patient not taking: Reported on 01/18/2024 01/04/24   Hetty Blend L, NP-C  colchicine 0.6 MG tablet Take 1 tablet (0.6 mg total) by mouth daily as needed. Patient not taking: Reported on 08/19/2023 09/11/22   Fuller Plan, MD  ezetimibe (ZETIA) 10 MG tablet Take 1 tablet (10 mg total) by mouth daily. Patient not taking: Reported on 01/18/2024 07/13/23   Georgina Quint, MD  rosuvastatin (CRESTOR) 20 MG tablet Take 1 tablet (20 mg total) by mouth daily. Patient not taking: Reported on 07/13/2023 01/05/23   Georgina Quint, MD  tiZANidine (ZANAFLEX) 4 MG tablet  Take 1 tablet (4 mg total) by mouth every 6 (six) hours as needed for muscle spasms. Patient not taking: Reported on 01/18/2024 04/28/23   Coletta Memos, MD    Allergies  Allergen Reactions   Darvocet [Propoxyphene N-Acetaminophen] Nausea And Vomiting    Patient Active Problem List   Diagnosis Date Noted   Statin intolerance 07/13/2023   Spondylolisthesis of lumbar region 04/27/2023   Chronic joint pain 09/16/2022   Positive colorectal  cancer screening using Cologuard test 07/07/2022   Stage 3a chronic kidney disease (HCC) 01/08/2022   Current smoker 01/06/2022   Bilateral hand pain 08/28/2021   Positive ANA (antinuclear antibody) 08/28/2021   Primary osteoarthritis involving multiple joints 07/08/2021   Dyslipidemia 08/01/2019   Degenerative arthritis of thumb 09/04/2015   Vitamin D deficiency 05/26/2012   Hypothyroidism 02/26/2012   Hyperlipidemia with target LDL less than 100 02/26/2012   Essential hypertension 02/26/2012   Obesity (BMI 30.0-34.9) 02/26/2012   Bulging lumbar disc 02/26/2012   GERD (gastroesophageal reflux disease) 02/26/2012    Past Medical History:  Diagnosis Date   Allergy    Arthritis    GERD (gastroesophageal reflux disease)    High cholesterol    Hypertension    Hypothyroidism    PONV (postoperative nausea and vomiting)    Thyroid disease     Past Surgical History:  Procedure Laterality Date   ABDOMINAL HYSTERECTOMY  late 1980s   Fibroids were indication.  pt says also had SPO bil.     APPENDECTOMY  late 1980s   done at time of hysto.     BACK SURGERY     BREAST SURGERY     Right breast mass   ESOPHAGOGASTRODUODENOSCOPY (EGD) WITH PROPOFOL N/A 11/08/2018   Procedure: ESOPHAGOGASTRODUODENOSCOPY (EGD) WITH PROPOFOL;  Surgeon: Rachael Fee, MD;  Location: [facility] ENDOSCOPY;  Service: Endoscopy;  Laterality: N/A;   Hammer toes     TONSILLECTOMY      Social History   Socioeconomic History   Marital status: Married    Spouse name: Not on file   Number of children: 1   Years of education: Not on file   Highest education level: Not on file  Occupational History   Not on file  Tobacco Use   Smoking status: Every Day    Current packs/day: 0.10    Average packs/day: 0.1 packs/day for 22.0 years (2.2 ttl pk-yrs)    Types: Cigarettes   Smokeless tobacco: Never   Tobacco comments:    3 cigs daiy  Vaping Use   Vaping status: Never Used  Substance and Sexual Activity    Alcohol use: No   Drug use: No   Sexual activity: Not on file  Other Topics Concern   Not on file  Social History Narrative   1 biological child. 2 stepchildren   Social Drivers of Corporate investment banker Strain: Low Risk  (01/12/2023)   Overall Financial Resource Strain (CARDIA)    Difficulty of Paying Living Expenses: Not hard at all  Food Insecurity: No Food Insecurity (04/27/2023)   Hunger Vital Sign    Worried About Running Out of Food in the Last Year: Never true    Ran Out of Food in the Last Year: Never true  Transportation Needs: No Transportation Needs (04/27/2023)   PRAPARE - Administrator, Civil Service (Medical): No    Lack of Transportation (Non-Medical): No  Physical Activity: Insufficiently Active (01/12/2023)   Exercise Vital Sign    Days of  Exercise per Week: 3 days    Minutes of Exercise per Session: 30 min  Stress: No Stress Concern Present (01/12/2023)   [employer] of Occupational Health - Occupational Stress Questionnaire    Feeling of Stress : Not at all  Social Connections: Socially Integrated (01/12/2023)   Social Connection and Isolation Panel [NHANES]    Frequency of Communication with Friends and Family: More than three times a week    Frequency of Social Gatherings with Friends and Family: More than three times a week    Attends Religious Services: More than 4 times per year    Active Member of [employer] or Organizations: Yes    Attends Engineer, structural: More than 4 times per year    Marital Status: Married  Catering manager Violence: Not At Risk (04/27/2023)   Humiliation, Afraid, Rape, and Kick questionnaire    Fear of Current or Ex-Partner: No    Emotionally Abused: No    Physically Abused: No    Sexually Abused: No    Family History  Problem Relation Age of Onset   Heart disease Mother    Rheum arthritis Mother    Colon cancer Neg Hx    Esophageal cancer Neg Hx    Stomach cancer Neg Hx    Rectal cancer Neg Hx       Review of Systems  Constitutional:  Positive for malaise/fatigue. Negative for chills and fever.  HENT: Negative.  Negative for congestion and sore throat.   Respiratory: Negative.  Negative for cough and shortness of breath.   Cardiovascular: Negative.  Negative for chest pain and palpitations.  Gastrointestinal:  Negative for abdominal pain, diarrhea, nausea and vomiting.  Genitourinary: Negative.  Negative for dysuria and hematuria.  Musculoskeletal:  Positive for joint pain.  Skin: Negative.  Negative for rash.  Neurological: Negative.  Negative for dizziness and headaches.  All other systems reviewed and are negative.   Vitals:   01/18/24 1027  BP: 138/78  Pulse: 70  Temp: 98.3 F (36.8 C)  SpO2: 96%    Physical Exam Vitals reviewed.  Constitutional:      Appearance: Normal appearance.  HENT:     Head: Normocephalic.     Right Ear: Tympanic membrane, ear canal and external ear normal.     Left Ear: Tympanic membrane, ear canal and external ear normal.     Mouth/Throat:     Mouth: Mucous membranes are moist.     Pharynx: Oropharynx is clear.  Eyes:     Extraocular Movements: Extraocular movements intact.     Conjunctiva/sclera: Conjunctivae normal.     Pupils: Pupils are equal, round, and reactive to light.  Cardiovascular:     Rate and Rhythm: Normal rate and regular rhythm.     Pulses: Normal pulses.     Heart sounds: Normal heart sounds.  Pulmonary:     Effort: Pulmonary effort is normal.     Breath sounds: Normal breath sounds.  Abdominal:     Palpations: Abdomen is soft.     Tenderness: There is no abdominal tenderness.  Musculoskeletal:     Cervical back: No tenderness.  Lymphadenopathy:     Cervical: No cervical adenopathy.  Skin:    General: Skin is warm and dry.     Capillary Refill: Capillary refill takes less than 2 seconds.  Neurological:     General: No focal deficit present.     Mental Status: She is alert and oriented to person,  place, and time.  Psychiatric:        Mood and Affect: Mood normal.        Behavior: Behavior normal.      ASSESSMENT & PLAN: Problem List Items Addressed This Visit       Cardiovascular and Mediastinum   Essential hypertension   BP Readings from Last 3 Encounters:  01/18/24 138/78  01/04/24 134/72  08/19/23 132/86  Elevated blood pressure readings in the office and at home. Recommend to increase valsartan HCT to 160-12.5 mg daily Cardiovascular risks associated with hypertension discussed. Diet and nutrition discussed.       Relevant Medications   valsartan-hydrochlorothiazide (DIOVAN-HCT) 160-12.5 MG tablet   Other Relevant Orders   Comprehensive metabolic panel     Digestive   GERD (gastroesophageal reflux disease)   Clinically stable.  Asymptomatic. Continues omeprazole 20 mg daily        Endocrine   Hypothyroidism   Clinically euthyroid TSH done today Continue levothyroxine 112 mcg daily      Relevant Orders   TSH     Musculoskeletal and Integument   Primary osteoarthritis involving multiple joints   Sees rheumatologist on a regular basis Presently on methotrexate 17.5 mg weekly        Genitourinary   Stage 3a chronic kidney disease (HCC)   Stable.  Advised to stay well-hydrated and avoid NSAIDs. Recommend to start Jardiance or Farxiga to avoid further kidney function deterioration      Relevant Medications   empagliflozin (JARDIANCE) 10 MG TABS tablet   Other Relevant Orders   Comprehensive metabolic panel   Microalbumin / creatinine urine ratio     Other   Dyslipidemia   Stable chronic condition Intolerant to statins Lipid profile done today Diet and nutrition discussed Did not tolerate Zetia either due to joint pains.      Relevant Orders   Lipid panel   Current smoker   Cardiovascular and cancer risks associated with smoking discussed Smoking cessation advice given      Relevant Orders   CBC with Differential/Platelet    Other Visit Diagnoses       Encounter for general adult medical examination with abnormal findings    -  Primary   Relevant Orders   CBC with Differential/Platelet   TSH   Hemoglobin A1c   Comprehensive metabolic panel   Lipid panel   Microalbumin / creatinine urine ratio     Screening for deficiency anemia       Relevant Orders   CBC with Differential/Platelet     Screening for endocrine, metabolic and immunity disorder       Relevant Orders   Hemoglobin A1c   Comprehensive metabolic panel      Modifiable risk factors discussed with patient. Anticipatory guidance according to age provided. The following topics were also discussed: Social Determinants of Health Smoking and cardiovascular risks associated with this condition Diet and nutrition and need to decrease amount of daily carbohydrate intake and daily calories and increase amount of plant based protein in her diet Benefits of exercise Cancer screening and review of most recent mammogram and colonoscopy reports Vaccinations review and recommendations Cardiovascular risk assessment and need for blood work The 10-year ASCVD risk score (Arnett DK, et al., 2019) is: 24.4%   Values used to calculate the score:     Age: 61 years     Sex: Female     Is Non-Hispanic African American: Yes     Diabetic: No     Tobacco smoker: Yes  Systolic Blood Pressure: 138 mmHg     Is BP treated: Yes     HDL Cholesterol: 46.9 mg/dL     Total Cholesterol: 217 mg/dL Review of multiple chronic medical conditions and their management Review of all medications and changes made Mental health including depression and anxiety Fall and accident prevention  Patient Instructions  Health Maintenance, Female Adopting a healthy lifestyle and getting preventive care are important in promoting health and wellness. Ask your health care provider about: The right schedule for you to have regular tests and exams. Things you can do on your own to  prevent diseases and keep yourself healthy. What should I know about diet, weight, and exercise? Eat a healthy diet  Eat a diet that includes plenty of vegetables, fruits, low-fat dairy products, and lean protein. Do not eat a lot of foods that are high in solid fats, added sugars, or sodium. Maintain a healthy weight Body mass index (BMI) is used to identify weight problems. It estimates body fat based on height and weight. Your health care provider can help determine your BMI and help you achieve or maintain a healthy weight. Get regular exercise Get regular exercise. This is one of the most important things you can do for your health. Most adults should: Exercise for at least 150 minutes each week. The exercise should increase your heart rate and make you sweat (moderate-intensity exercise). Do strengthening exercises at least twice a week. This is in addition to the moderate-intensity exercise. Spend less time sitting. Even light physical activity can be beneficial. Watch cholesterol and blood lipids Have your blood tested for lipids and cholesterol at 68 years of age, then have this test every 5 years. Have your cholesterol levels checked more often if: Your lipid or cholesterol levels are high. You are older than 68 years of age. You are at high risk for heart disease. What should I know about cancer screening? Depending on your health history and family history, you may need to have cancer screening at various ages. This may include screening for: Breast cancer. Cervical cancer. Colorectal cancer. Skin cancer. Lung cancer. What should I know about heart disease, diabetes, and high blood pressure? Blood pressure and heart disease High blood pressure causes heart disease and increases the risk of stroke. This is more likely to develop in people who have high blood pressure readings or are overweight. Have your blood pressure checked: Every 3-5 years if you are 18-39 years of  age. Every year if you are 26 years old or older. Diabetes Have regular diabetes screenings. This checks your fasting blood sugar level. Have the screening done: Once every three years after age 107 if you are at a normal weight and have a low risk for diabetes. More often and at a younger age if you are overweight or have a high risk for diabetes. What should I know about preventing infection? Hepatitis B If you have a higher risk for hepatitis B, you should be screened for this virus. Talk with your health care provider to find out if you are at risk for hepatitis B infection. Hepatitis C Testing is recommended for: Everyone born from [birth year] through 1965. Anyone with known risk factors for hepatitis C. Sexually transmitted infections (STIs) Get screened for STIs, including gonorrhea and chlamydia, if: You are sexually active and are younger than 68 years of age. You are older than 68 years of age and your health care provider tells you that you are at risk for this  type of infection. Your sexual activity has changed since you were last screened, and you are at increased risk for chlamydia or gonorrhea. Ask your health care provider if you are at risk. Ask your health care provider about whether you are at high risk for HIV. Your health care provider may recommend a prescription medicine to help prevent HIV infection. If you choose to take medicine to prevent HIV, you should first get tested for HIV. You should then be tested every 3 months for as long as you are taking the medicine. Pregnancy If you are about to stop having your period (premenopausal) and you may become pregnant, seek counseling before you get pregnant. Take 400 to 800 micrograms (mcg) of folic acid every day if you become pregnant. Ask for birth control (contraception) if you want to prevent pregnancy. Osteoporosis and menopause Osteoporosis is a disease in which the bones lose minerals and strength with aging. This can  result in bone fractures. If you are 27 years old or older, or if you are at risk for osteoporosis and fractures, ask your health care provider if you should: Be screened for bone loss. Take a calcium or vitamin D supplement to lower your risk of fractures. Be given hormone replacement therapy (HRT) to treat symptoms of menopause. Follow these instructions at home: Alcohol use Do not drink alcohol if: Your health care provider tells you not to drink. You are pregnant, may be pregnant, or are planning to become pregnant. If you drink alcohol: Limit how much you have to: 0-1 drink a day. Know how much alcohol is in your drink. In the [place] one drink equals one 12 oz bottle of beer (355 mL), one 5 oz glass of wine (148 mL), or one 1 oz glass of hard liquor (44 mL). Lifestyle Do not use any products that contain nicotine or tobacco. These products include cigarettes, chewing tobacco, and vaping devices, such as e-cigarettes. If you need help quitting, ask your health care provider. Do not use street drugs. Do not share needles. Ask your health care provider for help if you need support or information about quitting drugs. General instructions Schedule regular health, dental, and eye exams. Stay current with your vaccines. Tell your health care provider if: You often feel depressed. You have ever been abused or do not feel safe at home. Summary Adopting a healthy lifestyle and getting preventive care are important in promoting health and wellness. Follow your health care provider's instructions about healthy diet, exercising, and getting tested or screened for diseases. Follow your health care provider's instructions on monitoring your cholesterol and blood pressure. This information is not intended to replace advice given to you by your health care provider. Make sure you discuss any questions you have with your health care provider. Document Revised: 03/03/2021 Document Reviewed:  03/03/2021 Elsevier Patient Education  2024 Elsevier Inc.     Erika Barth, MD [facility]

## 2024-01-18 NOTE — Assessment & Plan Note (Signed)
Clinically stable.  Asymptomatic. Continues omeprazole 20 mg daily

## 2024-01-18 NOTE — Assessment & Plan Note (Signed)
Sees rheumatologist on a regular basis Presently on methotrexate 17.5 mg weekly

## 2024-01-18 NOTE — Assessment & Plan Note (Signed)
 Stable chronic condition Intolerant to statins Lipid profile done today Diet and nutrition discussed Did not tolerate Zetia either due to joint pains.

## 2024-01-18 NOTE — Assessment & Plan Note (Signed)
Clinically euthyroid.  TSH done today. Continue levothyroxine 112 mcg daily.

## 2024-01-18 NOTE — Progress Notes (Signed)
 Subjective:   Erika Cervantes is a 68 y.o. who presents for a Medicare Wellness preventive visit.  Visit Complete: Virtual I connected with  [name] on 01/18/24 by a audio enabled telemedicine application and verified that I am speaking with the correct person using two identifiers.  Patient Location: Home  Provider Location: Home Office  I discussed the limitations of evaluation and management by telemedicine. The patient expressed understanding and agreed to proceed.  Vital Signs: Because this visit was a virtual/telehealth visit, some criteria may be missing or patient reported. Any vitals not documented were not able to be obtained and vitals that have been documented are patient reported.  VideoDeclined- This patient declined Librarian, academic. Therefore the visit was completed with audio only.  Persons Participating in Visit: Patient.  AWV Questionnaire: No: Patient Medicare AWV questionnaire was not completed prior to this visit.  Cardiac Risk Factors include: advanced age (>58men, >17 women);hypertension;dyslipidemia;Other (see comment), Risk factor comments: Stage 3a CKD,     Objective:    Today's Vitals   01/18/24 1141  Weight: 189 lb (85.7 kg)  Height: 5\' 7"  (1.702 m)   Body mass index is 29.6 kg/m.     01/18/2024   11:45 AM 04/27/2023    6:41 AM 03/31/2023    3:14 PM 01/12/2023    4:18 PM 11/06/2018   10:00 PM  Advanced Directives  Does Patient Have a Medical Advance Directive? No No No No No  Would patient like information on creating a medical advance directive?  No - Patient declined Yes (MAU/Ambulatory/Procedural Areas - Information given) No - Patient declined No - Patient declined    Current Medications (verified) Outpatient Encounter Medications as of 01/18/2024  Medication Sig   acetaminophen (TYLENOL) 650 MG CR tablet Take 650 mg by mouth every 8 (eight) hours as needed for pain.   albuterol (VENTOLIN HFA) 108 (90 Base)  MCG/ACT inhaler Inhale 2 puffs into the lungs every 6 (six) hours as needed.   amoxicillin-clavulanate (AUGMENTIN) 875-125 MG tablet Take 1 tablet by mouth 2 (two) times daily.   azithromycin (ZITHROMAX) 250 MG tablet Sig as indicated   benzonatate (TESSALON) 200 MG capsule Take 1 capsule (200 mg total) by mouth 2 (two) times daily as needed for cough.   colchicine 0.6 MG tablet Take 1 tablet (0.6 mg total) by mouth daily as needed.   ELDERBERRY PO Take 1 capsule by mouth daily.   empagliflozin (JARDIANCE) 10 MG TABS tablet Take 1 tablet (10 mg total) by mouth daily before breakfast.   ezetimibe (ZETIA) 10 MG tablet Take 1 tablet (10 mg total) by mouth daily.   folic acid (FOLVITE) 1 MG tablet Take 1 mg by mouth daily.   levothyroxine (SYNTHROID) 125 MCG tablet Take 1 tablet (125 mcg total) by mouth daily before breakfast.   loratadine (CLARITIN) 10 MG tablet Take 10 mg by mouth daily as needed for allergies.   methotrexate (RHEUMATREX) 2.5 MG tablet Take 17.5 mg by mouth every Wednesday.   Misc Natural Products (CHOLESTEROL SUPPORT PO) Take 1 capsule by mouth daily.   ofloxacin (OCUFLOX) 0.3 % ophthalmic solution    omeprazole (PRILOSEC) 20 MG capsule TAKE 1 CAPSULE BY MOUTH DAILY   prednisoLONE acetate (PRED FORTE) 1 % ophthalmic suspension SMARTSIG:In Eye(s)   rosuvastatin (CRESTOR) 20 MG tablet Take 1 tablet (20 mg total) by mouth daily.   tiZANidine (ZANAFLEX) 4 MG tablet Take 1 tablet (4 mg total) by mouth every 6 (six)  hours as needed for muscle spasms.   trolamine salicylate (ASPERCREME) 10 % cream Apply 1 Application topically as needed for muscle pain.   TURMERIC PO Take 1 capsule by mouth daily.   valsartan-hydrochlorothiazide (DIOVAN-HCT) 160-12.5 MG tablet Take 1 tablet by mouth daily.   No facility-administered encounter medications on file as of 01/18/2024.    Allergies (verified) Darvocet [propoxyphene n-acetaminophen]   History: Past Medical History:  Diagnosis Date    Allergy    Arthritis    GERD (gastroesophageal reflux disease)    High cholesterol    Hypertension    Hypothyroidism    PONV (postoperative nausea and vomiting)    Thyroid disease    Past Surgical History:  Procedure Laterality Date   ABDOMINAL HYSTERECTOMY  late 1980s   Fibroids were indication.  pt says also had SPO bil.     APPENDECTOMY  late 1980s   done at time of hysto.     BACK SURGERY  04/27/2023   BREAST SURGERY     Right breast mass   cataract surgery  05/2023   ESOPHAGOGASTRODUODENOSCOPY (EGD) WITH PROPOFOL N/A 11/08/2018   Procedure: ESOPHAGOGASTRODUODENOSCOPY (EGD) WITH PROPOFOL;  Surgeon: Rachael Fee, MD;  Location: [facility] ENDOSCOPY;  Service: Endoscopy;  Laterality: N/A;   Hammer toes     TONSILLECTOMY     Family History  Problem Relation Age of Onset   Heart disease Mother    Rheum arthritis Mother    Colon cancer Neg Hx    Esophageal cancer Neg Hx    Stomach cancer Neg Hx    Rectal cancer Neg Hx    Social History   Socioeconomic History   Marital status: Married    Spouse name: Veto Kemps   Number of children: 1   Years of education: Not on file   Highest education level: Not on file  Occupational History   Not on file  Tobacco Use   Smoking status: Every Day    Current packs/day: 0.10    Average packs/day: 0.1 packs/day for 22.0 years (2.2 ttl pk-yrs)    Types: Cigarettes   Smokeless tobacco: Never   Tobacco comments:    3 cigs daiy  Vaping Use   Vaping status: Never Used  Substance and Sexual Activity   Alcohol use: No   Drug use: No   Sexual activity: Not on file  Other Topics Concern   Not on file  Social History Narrative   1 biological child. 2 stepchildren      Lives at home with husband   Social Drivers of Corporate investment banker Strain: Low Risk  (01/12/2023)   Overall Financial Resource Strain (CARDIA)    Difficulty of Paying Living Expenses: Not hard at all  Food Insecurity: No Food Insecurity (04/27/2023)   Hunger Vital  Sign    Worried About Running Out of Food in the Last Year: Never true    Ran Out of Food in the Last Year: Never true  Transportation Needs: No Transportation Needs (01/18/2024)   PRAPARE - Administrator, Civil Service (Medical): No    Lack of Transportation (Non-Medical): No  Physical Activity: Inactive (01/18/2024)   Exercise Vital Sign    Days of Exercise per Week: 0 days    Minutes of Exercise per Session: 0 min  Stress: No Stress Concern Present (01/18/2024)   [employer] of Occupational Health - Occupational Stress Questionnaire    Feeling of Stress : Not at all  Social Connections: Moderately Integrated (  01/18/2024)   Social Connection and Isolation Panel [NHANES]    Frequency of Communication with Friends and Family: More than three times a week    Frequency of Social Gatherings with Friends and Family: Twice a week    Attends Religious Services: More than 4 times per year    Active Member of [employer] or Organizations: No    Attends Engineer, structural: Never    Marital Status: Married    Tobacco Counseling Ready to quit: Not Answered Counseling given: Not Answered Tobacco comments: 3 cigs daiy    Clinical Intake:  Pre-visit preparation completed: Yes  Pain : No/denies pain     BMI - recorded: 29.6 Nutritional Status: BMI 25 -29 Overweight Nutritional Risks: None Diabetes: No  Lab Results  Component Value Date   HGBA1C 5.5 07/13/2023   HGBA1C 6.3 01/05/2023   HGBA1C 6.5 07/07/2022     How often do you need to have someone help you when you read instructions, pamphlets, or other written materials from your doctor or pharmacy?: 1 - Never  Interpreter Needed?: No  Information entered by :: Jeramyah Goodpasture, RMA   Activities of Daily Living     01/18/2024   11:42 AM 04/27/2023    6:41 AM  In your present state of health, do you have any difficulty performing the following activities:  Hearing? 0   Vision? 0   Difficulty  concentrating or making decisions? 0   Walking or climbing stairs? 0   Dressing or bathing? 0   Doing errands, shopping? 0 0  Preparing Food and eating ? N   Using the Toilet? N   In the past six months, have you accidently leaked urine? N   Do you have problems with loss of bowel control? N   Managing your Medications? N   Managing your Finances? N   Housekeeping or managing your Housekeeping? N     Patient Care Team: Georgina Quint, MD as PCP - General (Internal Medicine) Rossie Muskrat, MD (Rheumatology)  Indicate any recent Medical Services you may have received from other than [facility] providers in the past year (date may be approximate).     Assessment:   This is a routine wellness examination for [place].  Hearing/Vision screen Hearing Screening - Comments:: Denies hearing difficulties   Vision Screening - Comments:: Wears eyeglasses   Goals Addressed             This Visit's Progress    Remain active and independent   On track      Depression Screen     01/18/2024   11:47 AM 01/18/2024   10:34 AM 08/19/2023    1:46 PM 07/13/2023    9:07 AM 01/12/2023    4:17 PM 01/05/2023    8:52 AM 09/16/2022    1:09 PM  PHQ 2/9 Scores  PHQ - 2 Score 3 1 0 0 0 0 0  PHQ- 9 Score 6          Fall Risk     01/18/2024   11:45 AM 01/18/2024   10:34 AM 08/19/2023    1:46 PM 07/13/2023    9:07 AM 01/12/2023    4:17 PM  Fall Risk   Falls in the past year? 0 0 0 0 0  Number falls in past yr: 0 0 0 0 0  Injury with Fall? 0 0 0 0 0  Risk for fall due to : No Fall Risks No Fall Risks No Fall Risks No  Fall Risks No Fall Risks  Follow up Falls prevention discussed;Falls evaluation completed Falls evaluation completed Falls evaluation completed Falls evaluation completed Falls prevention discussed;Education provided;Falls evaluation completed    MEDICARE RISK AT HOME:  Medicare Risk at Home Any stairs in or around the home?: Yes If so, are there any without handrails?:  Yes Home free of loose throw rugs in walkways, pet beds, electrical cords, etc?: Yes Adequate lighting in your home to reduce risk of falls?: Yes Life alert?: No Use of a cane, walker or w/c?: No Grab bars in the bathroom?: No Shower chair or bench in shower?: No Elevated toilet seat or a handicapped toilet?: No  TIMED UP AND GO:  Was the test performed?  No  Cognitive Function: Normal: Normal cognitive status assessed by direct observation by this Clinical Health Advisor. No abnormalities found. Patient is able to answer questions in an accurate and timely manner.        01/12/2023    4:19 PM  6CIT Screen  What Year? 0 points  What month? 0 points  What time? 0 points  Count back from 20 0 points  Months in reverse 0 points  Repeat phrase 0 points  Total Score 0 points    Immunizations Immunization History  Administered Date(s) Administered   Fluad Quad(high Dose 65+) 07/08/2021, 07/07/2022   Fluad Trivalent(High Dose 65+) 07/13/2023   Influenza Split 08/28/2011   Influenza,inj,Quad PF,6+ Mos 10/06/2013, 07/10/2015, 07/15/2016, 10/24/2018, 08/01/2019   PFIZER(Purple Top)SARS-COV-2 Vaccination 01/09/2020, 01/29/2020, 09/03/2020   PNEUMOCOCCAL CONJUGATE-20 07/07/2022   Pfizer(Comirnaty)Fall Seasonal Vaccine 12 years and older 11/09/2023   Pneumococcal Polysaccharide-23 08/01/2019   Td 10/26/2002   Tdap 05/26/2012    Screening Tests Health Maintenance  Topic Date Due   Zoster Vaccines- Shingrix (1 of 2) Never done   Medicare Annual Wellness (AWV)  01/12/2024   COVID-19 Vaccine (5 - Pfizer risk 2024-25 season) 05/08/2024   MAMMOGRAM  11/27/2024   Colonoscopy  09/08/2025   Pneumonia Vaccine 74+ Years old  Completed   INFLUENZA VACCINE  Completed   DEXA SCAN  Completed   Hepatitis C Screening  Completed   HPV VACCINES  Aged Out   DTaP/Tdap/Td  Discontinued    Health Maintenance  Health Maintenance Due  Topic Date Due   Zoster Vaccines- Shingrix (1 of 2) Never  done   Medicare Annual Wellness (AWV)  01/12/2024   Health Maintenance Items Addressed: See Nurse Notes  Additional Screening:  Vision Screening: Recommended annual ophthalmology exams for early detection of glaucoma and other disorders of the eye.  Dental Screening: Recommended annual dental exams for proper oral hygiene  Community Resource Referral / Chronic Care Management: CRR required this visit?  No   CCM required this visit?  No     Plan:     I have personally reviewed and noted the following in the patient's chart:   Medical and social history Use of alcohol, tobacco or illicit drugs  Current medications and supplements including opioid prescriptions. Patient is not currently taking opioid prescriptions. Functional ability and status Nutritional status Physical activity Advanced directives List of other physicians Hospitalizations, surgeries, and ER visits in previous 12 months Vitals Screenings to include cognitive, depression, and falls Referrals and appointments  In addition, I have reviewed and discussed with patient certain preventive protocols, quality metrics, and best practice recommendations. A written personalized care plan for preventive services as well as general preventive health recommendations were provided to patient.     Cartel Mauss L Jaid Quirion,  CMA   01/18/2024   After Visit Summary: (MyChart) Due to this being a telephonic visit, the after visit summary with patients personalized plan was offered to patient via MyChart   Notes: Please refer to Routing Comments.

## 2024-01-18 NOTE — Assessment & Plan Note (Signed)
 Cardiovascular and cancer risks associated with smoking discussed. Smoking cessation advice given.

## 2024-01-18 NOTE — Telephone Encounter (Signed)
 CRITICAL VALUE STICKER  CRITICAL VALUE: Potassium 2.7  RECEIVER (on-site recipient of call): Thane Edu / Jolyne Loa  DATE & TIME NOTIFIED: 01/18/2024, 2:10pm  MESSENGER (representative from lab):  MD NOTIFIED: Yes  TIME OF NOTIFICATION: 2:15pm  RESPONSE:  MD Notified, routing as well

## 2024-01-19 ENCOUNTER — Telehealth: Payer: Self-pay | Admitting: Emergency Medicine

## 2024-01-19 NOTE — Telephone Encounter (Signed)
 Copied from CRM [id number]. Topic: Clinical - Prescription Issue >> Jan 18, 2024  4:55 PM Sim Boast F wrote: Reason for CRM: Patient called says JARDIANCE is $146 and wants to know if there's a way to help with the cost - says the generic is over $100 also

## 2024-01-21 ENCOUNTER — Other Ambulatory Visit: Payer: Self-pay | Admitting: Radiology

## 2024-01-21 DIAGNOSIS — N1831 Chronic kidney disease, stage 3a: Secondary | ICD-10-CM

## 2024-01-21 NOTE — Telephone Encounter (Signed)
 Referral for PAP has been place will inform patient

## 2024-01-24 ENCOUNTER — Telehealth: Payer: Self-pay | Admitting: *Deleted

## 2024-01-24 NOTE — Progress Notes (Signed)
 Care Guide Pharmacy Note  01/24/2024 Name: Erika Cervantes MRN: [id number] DOB: [birth year]  Referred By: Georgina Quint, MD Reason for referral: Care Coordination (Outreach to schedule referral with pharmacist )   Erika Cervantes is a 68 y.o. year old female who is a primary care patient of Georgina Quint, MD.  Erika Cervantes was referred to the pharmacist for assistance related to:  assistance with Jardiance   Successful contact was made with the patient to discuss pharmacy services including being ready for the pharmacist to call at least 5 minutes before the scheduled appointment time and to have medication bottles and any blood pressure readings ready for review. The patient agreed to meet with the pharmacist via telephone visit on 02/08/2024  Burman Nieves, CMA [place]  [facility] Guide Direct Dial: [phone]  Fax: [phone] Website: [email or web address]

## 2024-02-08 ENCOUNTER — Other Ambulatory Visit (INDEPENDENT_AMBULATORY_CARE_PROVIDER_SITE_OTHER): Admitting: Pharmacist

## 2024-02-08 DIAGNOSIS — M0579 Rheumatoid arthritis with rheumatoid factor of multiple sites without organ or systems involvement: Secondary | ICD-10-CM | POA: Diagnosis not present

## 2024-02-08 DIAGNOSIS — M1990 Unspecified osteoarthritis, unspecified site: Secondary | ICD-10-CM | POA: Diagnosis not present

## 2024-02-08 DIAGNOSIS — Z79899 Other long term (current) drug therapy: Secondary | ICD-10-CM | POA: Diagnosis not present

## 2024-02-08 DIAGNOSIS — M79643 Pain in unspecified hand: Secondary | ICD-10-CM | POA: Diagnosis not present

## 2024-02-08 DIAGNOSIS — E039 Hypothyroidism, unspecified: Secondary | ICD-10-CM | POA: Diagnosis not present

## 2024-02-08 DIAGNOSIS — N1831 Chronic kidney disease, stage 3a: Secondary | ICD-10-CM

## 2024-02-08 NOTE — Patient Instructions (Signed)
 It was a pleasure speaking with you today!  I have sent the Jardiance assistance application to [facility].  Feel free to call with any questions or concerns!  Rainelle Bur, PharmD, BCPS, CPP Clinical Pharmacist Practitioner [facility] at [facility] [phone]

## 2024-02-08 NOTE — Progress Notes (Signed)
   02/08/2024 Name: Erika Cervantes MRN: [id number] DOB: [birth year]  Chief Complaint  Patient presents with   Chronic Kidney Disease   Medication Management    Erika Cervantes is a 68 y.o. year old female who presented for a telephone visit.   They were referred to the pharmacist by their PCP for assistance in managing  CKD .    Subjective:  Care Team: Primary Care Provider: Elvira Hammersmith, MD ; Next Scheduled Visit: 07/18/24   Medication Access/Adherence  Current Pharmacy:  [facility] [id number] [place], [place] - [address] [place] [address] Phone: [phone] Fax: [phone]   Patient reports affordability concerns with their medications: Yes  Patient reports access/transportation concerns to their pharmacy: No  Patient reports adherence concerns with their medications:  No     CKD Current medications: valsartan/hydrochlorothiazide 160/12.5 mg daily (just increased ARB from 80 mg)  Jardiance was prescribed, however pt is unable to afford. Copay is $147 per month  Objective:  Lab Results  Component Value Date   HGBA1C 5.8 01/18/2024    Lab Results  Component Value Date   CREATININE 1.05 01/18/2024   BUN 14 01/18/2024   NA 142 01/18/2024   K 2.7 (LL) 01/18/2024   CL 105 01/18/2024   CO2 29 01/18/2024    Lab Results  Component Value Date   CHOL 197 01/18/2024   HDL 43.10 01/18/2024   LDLCALC 130 (H) 01/18/2024   LDLDIRECT 181 (H) 03/12/2014   TRIG 116.0 01/18/2024   CHOLHDL 5 01/18/2024    Medications Reviewed Today   Medications were not reviewed in this encounter       Assessment/Plan:   CKD Currently controlled. Recommend maximizing ARB to max of 320 mg valsartan Pt qualifies for Jardiance PAP. Filled out application for patient to sign. Faxed to [employer]  Follow Up Plan: PRN  Rainelle Bur, PharmD, BCPS, CPP Clinical Pharmacist Practitioner [place] [facility] at [facility] [phone]

## 2024-02-09 ENCOUNTER — Telehealth: Payer: Self-pay

## 2024-02-09 NOTE — Telephone Encounter (Signed)
 Received PAP via [employer] from [facility] she has already faxed to company,will document and index and follow up in a few days.

## 2024-02-29 NOTE — Telephone Encounter (Signed)
 Following up on pt PAP [employer] Jardiance  per company pt has been APPROVED for 2025 LEFT A HIPAA VM with pt number.

## 2024-03-08 DIAGNOSIS — H35353 Cystoid macular degeneration, bilateral: Secondary | ICD-10-CM | POA: Diagnosis not present

## 2024-03-22 DIAGNOSIS — E039 Hypothyroidism, unspecified: Secondary | ICD-10-CM | POA: Diagnosis not present

## 2024-03-22 DIAGNOSIS — M1990 Unspecified osteoarthritis, unspecified site: Secondary | ICD-10-CM | POA: Diagnosis not present

## 2024-03-22 DIAGNOSIS — M0579 Rheumatoid arthritis with rheumatoid factor of multiple sites without organ or systems involvement: Secondary | ICD-10-CM | POA: Diagnosis not present

## 2024-03-22 DIAGNOSIS — Z79899 Other long term (current) drug therapy: Secondary | ICD-10-CM | POA: Diagnosis not present

## 2024-03-22 DIAGNOSIS — L03019 Cellulitis of unspecified finger: Secondary | ICD-10-CM | POA: Diagnosis not present

## 2024-03-22 DIAGNOSIS — M79643 Pain in unspecified hand: Secondary | ICD-10-CM | POA: Diagnosis not present

## 2024-04-04 ENCOUNTER — Ambulatory Visit (INDEPENDENT_AMBULATORY_CARE_PROVIDER_SITE_OTHER): Admitting: Podiatry

## 2024-04-04 ENCOUNTER — Encounter: Payer: Self-pay | Admitting: Podiatry

## 2024-04-04 DIAGNOSIS — L600 Ingrowing nail: Secondary | ICD-10-CM

## 2024-04-04 DIAGNOSIS — M79671 Pain in right foot: Secondary | ICD-10-CM | POA: Diagnosis not present

## 2024-04-04 NOTE — Patient Instructions (Signed)
.  tfc  

## 2024-04-04 NOTE — Progress Notes (Signed)
 Patient complains of painful ingrown lateral border(s) toe 1 right. Patient denies fevers, chills, nausea, vomiting.  Is noticing clear drainage and crusting.  Also tenderness on the medial aspect of the second toe at times.  Objective:  Vitals: Reviewed  General: Well developed, nourished, in no acute distress, alert and oriented x3   Vascular: DP pulse 2/4 bilateral. PT pulse 2/4 bilateral.  Minimal edema.  Capillary fill time immediate  Dermatology: Erythema, edema, incurvated nail border lateral hallux right with clear drainage . Tenderness present with palpation. Normal skin tone and texture feet with normal hair growth.  Neurological: Grossly intact. Normal reflexes.   Musculoskeletal: Tenderness with palpation of the distal hallux right. No tenderness or painful ROM at IPJ.  Hammertoe second toe right with some tenderness at the medial aspect of the proximal interphalange.  Diagnosis: 1.  Pain right foot. 2.  Ingrown nail lateral border hallux right  Plan: -New patient office visit level 3 for evaluation and management. -discussed etiology and treatment of ingrown nails. Discussed surgical vs conservative treatment.  Also discussed hammertoe second toe.  I think that taking off the lateral nail border of the hallux will probably help alleviate some of the discomfort.  After the hallux heal she might benefit from a sleeve for the second toe -Consent signed for appropriate matrixectomy affected nail(s). -Rx: Keflex 500 mg, 2 p.o. twice daily for 7 days  Procedure(s):   - Matrixectomy(s) lateral border hallux right: Toe anesthetized with 3cc 2:1 mixture 2% Lidocaine  with epinephrine : Sodium Bicarbonate. Surgical site prepped. Digital tourniquet applied.  Avulsion of lateral nail border performed. Matrixecomy performed with three 30 second applications of phenol to nail matrix. Site irrigated with alcohol.  Tourniquet released with good vascularity noticed in digit.  Applied triple  antibiotic to nailbed and applied gauze and Coban dressing. - Written and oral postoperative instructions given.  -Return for post-op 2 weeks.  Baker Bon, DPM

## 2024-04-07 ENCOUNTER — Telehealth: Payer: Self-pay | Admitting: Podiatry

## 2024-04-07 DIAGNOSIS — H35353 Cystoid macular degeneration, bilateral: Secondary | ICD-10-CM | POA: Diagnosis not present

## 2024-04-07 MED ORDER — CEPHALEXIN 500 MG PO CAPS: 500.0000 mg | ORAL_CAPSULE | Freq: Two times a day (BID) | ORAL | 0 refills | Status: AC

## 2024-04-07 NOTE — Telephone Encounter (Signed)
 Patient was seen on 04/04/24, ingrown toe nail removal, patient stated swelling on top of right foot, when putting pressure on foot, it is uncomfortable. Patient contact telephone number, [phone]

## 2024-04-17 ENCOUNTER — Encounter: Payer: Self-pay | Admitting: Family Medicine

## 2024-04-17 ENCOUNTER — Ambulatory Visit (INDEPENDENT_AMBULATORY_CARE_PROVIDER_SITE_OTHER): Admitting: Family Medicine

## 2024-04-17 VITALS — BP 136/84 | HR 63 | Temp 98.3°F | Ht 67.0 in | Wt 189.2 lb

## 2024-04-17 DIAGNOSIS — J329 Chronic sinusitis, unspecified: Secondary | ICD-10-CM | POA: Diagnosis not present

## 2024-04-17 DIAGNOSIS — R051 Acute cough: Secondary | ICD-10-CM | POA: Diagnosis not present

## 2024-04-17 DIAGNOSIS — B9689 Other specified bacterial agents as the cause of diseases classified elsewhere: Secondary | ICD-10-CM | POA: Insufficient documentation

## 2024-04-17 MED ORDER — PROMETHAZINE HCL 6.25 MG/5ML PO SOLN
6.2500 mg | Freq: Four times a day (QID) | ORAL | 0 refills | Status: AC | PRN
Start: 2024-04-17 — End: ?

## 2024-04-17 MED ORDER — DOXYCYCLINE HYCLATE 100 MG PO CAPS
100.0000 mg | ORAL_CAPSULE | Freq: Two times a day (BID) | ORAL | 0 refills | Status: AC
Start: 2024-04-17 — End: 2024-04-24

## 2024-04-17 NOTE — Patient Instructions (Addendum)
 I have sent in a prescription for doxycycline  for you to take twice a day for 7 days.  Take this medication with food as it can upset your stomach if you do not.  I have sent in hydrocodone  cough syrup for you to take 5 mL once daily in the evening as needed for cough.  This medication may make you sleepy.  Do not drive or operate heavy machinery while taking this medication.  Follow-up with me for new or worsening symptoms.

## 2024-04-17 NOTE — Progress Notes (Signed)
 Acute Office Visit  Subjective:     Patient ID: Erika Cervantes, female    DOB: [birth year], 68 y.o.   MRN: [id number]  Chief Complaint  Patient presents with   Acute Visit    Ongoing for a little over a week. Nasal congestion, head/face pressure, yellow mucus, and chest tightness. Has tried mucinex , and left over ABX from previous cold    HPI Patient is in today for evaluation of cough, sinus pain and pressure, postnasal drip, chest tightness, fatigue, for the last week. Has tried Mucinex  with no relief Denies known sick contacts. Denies abdominal pain, nausea, vomiting, diarrhea, rash, fever, chills, other symptoms.  Medical hx as outlined below.  ROS Per HPI      Objective:    BP 136/84 (BP Location: Left Arm, Patient Position: Sitting, Cuff Size: Normal)   Pulse 63   Temp 98.3 F (36.8 C) (Temporal)   Ht 5' 7 (1.702 m)   Wt 189 lb 3.2 oz (85.8 kg)   SpO2 98%   BMI 29.63 kg/m    Physical Exam Vitals and nursing note reviewed.  Constitutional:      General: She is not in acute distress.    Comments: Appears fatigued  HENT:     Head: Normocephalic and atraumatic.     Right Ear: External ear normal.     Left Ear: External ear normal.     Nose: No congestion.     Right Sinus: Maxillary sinus tenderness and frontal sinus tenderness present.     Left Sinus: Maxillary sinus tenderness and frontal sinus tenderness present.     Mouth/Throat:     Mouth: Mucous membranes are moist.     Pharynx: Oropharynx is clear. No oropharyngeal exudate or posterior oropharyngeal erythema.     Comments: Oropharyngeal cobblestoning    Eyes:     Extraocular Movements: Extraocular movements intact.    Cardiovascular:     Rate and Rhythm: Normal rate and regular rhythm.     Heart sounds: Normal heart sounds.  Pulmonary:     Effort: Pulmonary effort is normal. No respiratory distress.     Breath sounds: No wheezing, rhonchi or rales.   Musculoskeletal:     Cervical back:  Normal range of motion and neck supple.  Lymphadenopathy:     Cervical: No cervical adenopathy.   Skin:    General: Skin is warm and dry.   Neurological:     General: No focal deficit present.     Mental Status: She is alert and oriented to person, place, and time.    No results found for any visits on 04/17/24.      Assessment & Plan:   Bacterial sinusitis -     Doxycycline  Hyclate; Take 1 capsule (100 mg total) by mouth 2 (two) times daily for 7 days.  Dispense: 14 capsule; Refill: 0  Acute cough -     Promethazine HCl; Take 5 mLs (6.25 mg total) by mouth every 6 (six) hours as needed for nausea or vomiting.  Dispense: 120 mL; Refill: 0     Meds ordered this encounter  Medications   doxycycline  (VIBRAMYCIN ) 100 MG capsule    Sig: Take 1 capsule (100 mg total) by mouth 2 (two) times daily for 7 days.    Dispense:  14 capsule    Refill:  0   promethazine (PHENERGAN) 6.25 MG/5ML solution    Sig: Take 5 mLs (6.25 mg total) by mouth every 6 (six) hours as needed for  nausea or vomiting.    Dispense:  120 mL    Refill:  0    Return if symptoms worsen or fail to improve.  Corean LITTIE Ku, FNP

## 2024-04-18 ENCOUNTER — Ambulatory Visit: Admitting: Podiatry

## 2024-04-18 DIAGNOSIS — L600 Ingrowing nail: Secondary | ICD-10-CM

## 2024-04-18 NOTE — Progress Notes (Signed)
 Patient presents follow-up nail surgery left great toe.  No complaints.  Physical exam:  Dermatologic: Nail surgery site for matrixectomy healing well with no signs of infection.  Diagnosis: 1.  Status post matrixectomy toe.  Healing well  Plan: - POV status post nail surgery hallux right if patient has any problems she can call for appointment otherwise we can see her as needed - Return as needed

## 2024-04-21 ENCOUNTER — Ambulatory Visit: Payer: Self-pay

## 2024-04-21 DIAGNOSIS — H35353 Cystoid macular degeneration, bilateral: Secondary | ICD-10-CM | POA: Diagnosis not present

## 2024-04-21 DIAGNOSIS — H40053 Ocular hypertension, bilateral: Secondary | ICD-10-CM | POA: Diagnosis not present

## 2024-04-21 NOTE — Telephone Encounter (Signed)
 FYI Only or Action Required?: FYI only for provider.  Patient was last seen in primary care on 04/17/2024 by Alvia Corean CROME, FNP. Called Nurse Triage reporting sinus congestion. Symptoms began several days ago. Interventions attempted: Prescription medications: doxycycline  and Rest, hydration, or home remedies. Symptoms are: unchanged.  Triage Disposition: See PCP When Office is Open (Within 3 Days)  Patient/caregiver understands and will follow disposition?: Yes  Copied from CRM [phone]. Topic: Clinical - Red Word Triage >> Apr 21, 2024  3:53 PM Berneda FALCON wrote: Red Word that prompted transfer to Nurse Triage: Patient states that she has been sick with congestion and has been on antibiotics since her appt with Corean Alvia on Monday 6/23 and there has been no improvement and now is experiencing redness and swelling in her eyes and face.  Just left the eye Dr for the redness in her eyes and they told her to come see us  and follow up. Reason for Disposition  [1] Sinus congestion (pressure, fullness) AND [2] present > 10 days  Answer Assessment - Initial Assessment Questions 1. LOCATION: Where does it hurt?      Pain around sinus area 2. ONSET: When did the sinus pain start?  (e.g., hours, days)      Started on Monday-was seen by Alvia NP on 6/23 3. SEVERITY: How bad is the pain?   (Scale 1-10; mild, moderate or severe)   - MILD (1-3): doesn't interfere with normal activities    - MODERATE (4-7): interferes with normal activities (e.g., work or school) or awakens from sleep   - SEVERE (8-10): excruciating pain and patient unable to do any normal activities        5 out of 10 4. RECURRENT SYMPTOM: Have you ever had sinus problems before? If Yes, ask: When was the last time? and What happened that time?      yes 5. NASAL CONGESTION: Is the nose blocked? If Yes, ask: Can you open it or must you breathe through your mouth?     Nose will be blocked and then  patient will use an OTC medication to help open her nasal passages.  6. NASAL DISCHARGE: Do you have discharge from your nose? If so ask, What color?     No discharge 7. FEVER: Do you have a fever? If Yes, ask: What is it, how was it measured, and when did it start?      no 8. OTHER SYMPTOMS: Do you have any other symptoms? (e.g., sore throat, cough, earache, difficulty breathing)     Chest tightness  Patient scheduled for an appointment with PCP for April 24, 2024  Protocols used: Sinus Pain or Congestion-A-AH

## 2024-04-24 ENCOUNTER — Encounter: Payer: Self-pay | Admitting: Emergency Medicine

## 2024-04-24 ENCOUNTER — Ambulatory Visit (INDEPENDENT_AMBULATORY_CARE_PROVIDER_SITE_OTHER): Admitting: Emergency Medicine

## 2024-04-24 VITALS — BP 154/88 | HR 88 | Temp 98.0°F | Ht 67.0 in | Wt 188.0 lb

## 2024-04-24 DIAGNOSIS — R6889 Other general symptoms and signs: Secondary | ICD-10-CM | POA: Insufficient documentation

## 2024-04-24 DIAGNOSIS — B9689 Other specified bacterial agents as the cause of diseases classified elsewhere: Secondary | ICD-10-CM

## 2024-04-24 DIAGNOSIS — I10 Essential (primary) hypertension: Secondary | ICD-10-CM

## 2024-04-24 DIAGNOSIS — E785 Hyperlipidemia, unspecified: Secondary | ICD-10-CM | POA: Diagnosis not present

## 2024-04-24 DIAGNOSIS — N1831 Chronic kidney disease, stage 3a: Secondary | ICD-10-CM | POA: Diagnosis not present

## 2024-04-24 DIAGNOSIS — J0100 Acute maxillary sinusitis, unspecified: Secondary | ICD-10-CM | POA: Insufficient documentation

## 2024-04-24 DIAGNOSIS — J329 Chronic sinusitis, unspecified: Secondary | ICD-10-CM

## 2024-04-24 DIAGNOSIS — F17200 Nicotine dependence, unspecified, uncomplicated: Secondary | ICD-10-CM

## 2024-04-24 LAB — POCT INFLUENZA A/B
Influenza A, POC: NEGATIVE
Influenza B, POC: NEGATIVE

## 2024-04-24 LAB — POC COVID19 BINAXNOW: SARS Coronavirus 2 Ag: NEGATIVE

## 2024-04-24 MED ORDER — AMOXICILLIN-POT CLAVULANATE 875-125 MG PO TABS
1.0000 | ORAL_TABLET | Freq: Two times a day (BID) | ORAL | 0 refills | Status: AC
Start: 2024-04-24 — End: 2024-05-01

## 2024-04-24 MED ORDER — FLUTICASONE PROPIONATE 50 MCG/ACT NA SUSP
2.0000 | Freq: Every day | NASAL | 6 refills | Status: AC
Start: 2024-04-24 — End: ?

## 2024-04-24 NOTE — Assessment & Plan Note (Signed)
 Stable chronic condition Intolerant to statins Lipid profile done today Diet and nutrition discussed Did not tolerate Zetia either due to joint pains.

## 2024-04-24 NOTE — Assessment & Plan Note (Signed)
 Cardiovascular and cancer risks associated with smoking discussed. Smoking cessation advice given.

## 2024-04-24 NOTE — Assessment & Plan Note (Signed)
 Stable.  Advised to stay well-hydrated and avoid NSAIDs. Was able to start Jardiance  10 mg daily

## 2024-04-24 NOTE — Assessment & Plan Note (Signed)
 Symptom management discussed Advised to rest and stay well-hydrated Continue Tylenol  for headaches and facial pain as needed Advised to contact the office if no better or worse during the next several days.

## 2024-04-24 NOTE — Patient Instructions (Signed)

## 2024-04-24 NOTE — Progress Notes (Signed)
 Erika Cervantes 68 y.o.   Chief Complaint  Patient presents with   Cough    Patient here for congestion that's been going on for 3 weeks. She came last week to see Corean Cough NP and she gave her an abx that helped a little. She states she feels like she has the flu.. she was not tested when she came in last week. Pt is having pressure and swelling on her face. She was taking taking mucinex  but stopped. She does feel worse today     HISTORY OF PRESENT ILLNESS: This is a 68 y.o. female complaining of flulike symptoms that started about 3 weeks ago progressively getting worse Was seen here in the office on 04/17/2024 and diagnosed with bacterial sinusitis.  Was started on doxycycline  and cough medicine Finished 7-day antibiotic course. Symptoms still persistent. Mostly complaining of sinus congestion with pressure and swelling on her face No other complaints or medical concerns today. She did develop a red eye.  Saw her eye doctor last Friday with no concerns.  She has a subconjunctival hemorrhage on the right side.  Cough Pertinent negatives include no chest pain, chills, fever, headaches, rash, sore throat or shortness of breath.     Prior to Admission medications   Medication Sig Start Date End Date Taking? Authorizing Provider  acetaminophen  (TYLENOL ) 650 MG CR tablet Take 650 mg by mouth every 8 (eight) hours as needed for pain.   Yes [provider]  albuterol  (VENTOLIN  HFA) 108 (90 Base) MCG/ACT inhaler Inhale 2 puffs into the lungs every 6 (six) hours as needed. 01/04/24  Yes Henson, Vickie L, NP-C  benzonatate  (TESSALON ) 200 MG capsule Take 1 capsule (200 mg total) by mouth 2 (two) times daily as needed for cough. 01/04/24  Yes Henson, Vickie L, NP-C  colchicine  0.6 MG tablet Take 1 tablet (0.6 mg total) by mouth daily as needed. 09/11/22  Yes Rice, Lonni ORN, MD  doxycycline  (VIBRAMYCIN ) 100 MG capsule Take 1 capsule (100 mg total) by mouth 2 (two) times daily for 7  days. 04/17/24 04/24/24 Yes Alvia Corean CROME, FNP  ELDERBERRY PO Take 1 capsule by mouth daily.   Yes [provider]  empagliflozin  (JARDIANCE ) 10 MG TABS tablet Take 1 tablet (10 mg total) by mouth daily before breakfast. 01/18/24  Yes Demonte Dobratz, Emil Schanz, MD  ezetimibe  (ZETIA ) 10 MG tablet Take 1 tablet (10 mg total) by mouth daily. 07/13/23  Yes Loui Massenburg, Emil Schanz, MD  folic acid  (FOLVITE ) 1 MG tablet Take 1 mg by mouth daily.   Yes [provider]  levothyroxine  (SYNTHROID ) 125 MCG tablet Take 1 tablet (125 mcg total) by mouth daily before breakfast. 07/13/23  Yes Kasch Borquez, Emil Schanz, MD  loratadine  (CLARITIN ) 10 MG tablet Take 10 mg by mouth daily as needed for allergies.   Yes [provider]  methotrexate (RHEUMATREX) 2.5 MG tablet Take 17.5 mg by mouth every Wednesday. 12/09/22  Yes [provider]  Misc Natural Products (CHOLESTEROL SUPPORT PO) Take 1 capsule by mouth daily.   Yes [provider]  ofloxacin (OCUFLOX) 0.3 % ophthalmic solution  07/06/23  Yes [provider]  omeprazole  (PRILOSEC) 20 MG capsule TAKE 1 CAPSULE BY MOUTH DAILY 12/27/23  Yes Qianna Clagett Jose, MD  potassium chloride  SA (KLOR-CON  M) 20 MEQ tablet Take 1 tablet (20 mEq total) by mouth daily. 01/18/24  Yes Malikhi Ogan Jose, MD  prednisoLONE acetate (PRED FORTE) 1 % ophthalmic suspension SMARTSIG:In Eye(s) 07/06/23  Yes [provider]  rosuvastatin  (CRESTOR ) 20 MG tablet Take 1 tablet (20 mg total) by mouth daily. 01/05/23  Yes Haytham Maher, Emil Schanz, MD  tiZANidine  (ZANAFLEX ) 4 MG tablet Take 1 tablet (4 mg total) by mouth every 6 (six) hours as needed for muscle spasms. 04/28/23  Yes Cabbell, Kyle, MD  trolamine salicylate (ASPERCREME) 10 % cream Apply 1 Application topically as needed for muscle pain.   Yes [provider]  TURMERIC PO Take 1 capsule by mouth daily.   Yes [provider]  valsartan -hydrochlorothiazide   (DIOVAN -HCT) 160-12.5 MG tablet Take 1 tablet by mouth daily. 01/18/24  Yes Grasiela Jonsson, Emil Schanz, MD  promethazine  (PHENERGAN ) 6.25 MG/5ML solution Take 5 mLs (6.25 mg total) by mouth every 6 (six) hours as needed for nausea or vomiting. Patient not taking: Reported on 04/24/2024 04/17/24   Alvia Corean CROME, FNP    Allergies  Allergen Reactions   Darvocet [Propoxyphene N-Acetaminophen ] Nausea And Vomiting    Patient Active Problem List   Diagnosis Date Noted   Acute non-recurrent maxillary sinusitis 04/24/2024   Flu-like symptoms 04/24/2024   Bacterial sinusitis 04/17/2024   Statin intolerance 07/13/2023   Spondylolisthesis of lumbar region 04/27/2023   Acute cough 01/05/2023   Chronic joint pain 09/16/2022   Positive colorectal cancer screening using Cologuard test 07/07/2022   Stage 3a chronic kidney disease (HCC) 01/08/2022   Current smoker 01/06/2022   Bilateral hand pain 08/28/2021   Positive ANA (antinuclear antibody) 08/28/2021   Primary osteoarthritis involving multiple joints 07/08/2021   Dyslipidemia 08/01/2019   Degenerative arthritis of thumb 09/04/2015   Vitamin D  deficiency 05/26/2012   Hypothyroidism 02/26/2012   Hyperlipidemia with target LDL less than 100 02/26/2012   Essential hypertension 02/26/2012   Obesity (BMI 30.0-34.9) 02/26/2012   Bulging lumbar disc 02/26/2012   GERD (gastroesophageal reflux disease) 02/26/2012    Past Medical History:  Diagnosis Date   Allergy    Arthritis    GERD (gastroesophageal reflux disease)    High cholesterol    Hypertension    Hypothyroidism    PONV (postoperative nausea and vomiting)    Thyroid  disease     Past Surgical History:  Procedure Laterality Date   ABDOMINAL HYSTERECTOMY  late 1980s   Fibroids were indication.  pt says also had SPO bil.     APPENDECTOMY  late 1980s   done at time of hysto.     BACK SURGERY  04/27/2023   BREAST SURGERY     Right breast mass   cataract surgery  05/2023    ESOPHAGOGASTRODUODENOSCOPY (EGD) WITH PROPOFOL  N/A 11/08/2018   Procedure: ESOPHAGOGASTRODUODENOSCOPY (EGD) WITH PROPOFOL ;  Surgeon: Teressa Toribio SQUIBB, MD;  Location: [facility] ENDOSCOPY;  Service: Endoscopy;  Laterality: N/A;   Hammer toes     TONSILLECTOMY      Social History   Socioeconomic History   Marital status: Married    Spouse name: Hollis   Number of children: 1   Years of education: Not on file   Highest education level: Not on file  Occupational History   Not on file  Tobacco Use   Smoking status: Every Day    Current packs/day: 0.10    Average packs/day: 0.1 packs/day for 22.0 years (2.2 ttl pk-yrs)    Types: Cigarettes   Smokeless tobacco: Never   Tobacco comments:    3 cigs daiy  Vaping Use   Vaping status: Never Used  Substance and Sexual Activity   Alcohol use: No   Drug use: No   Sexual  activity: Not on file  Other Topics Concern   Not on file  Social History Narrative   1 biological child. 2 stepchildren      Lives at home with husband   Social Drivers of Health   Financial Resource Strain: Medium Risk (01/18/2024)   Overall Financial Resource Strain (CARDIA)    Difficulty of Paying Living Expenses: Somewhat hard  Food Insecurity: No Food Insecurity (01/18/2024)   Hunger Vital Sign    Worried About Running Out of Food in the Last Year: Never true    Ran Out of Food in the Last Year: Never true  Transportation Needs: No Transportation Needs (01/18/2024)   PRAPARE - Administrator, Civil Service (Medical): No    Lack of Transportation (Non-Medical): No  Physical Activity: Inactive (01/18/2024)   Exercise Vital Sign    Days of Exercise per Week: 0 days    Minutes of Exercise per Session: 0 min  Stress: No Stress Concern Present (01/18/2024)   [employer] of Occupational Health - Occupational Stress Questionnaire    Feeling of Stress : Not at all  Social Connections: Moderately Integrated (01/18/2024)   Social Connection and Isolation Panel     Frequency of Communication with Friends and Family: More than three times a week    Frequency of Social Gatherings with Friends and Family: Twice a week    Attends Religious Services: More than 4 times per year    Active Member of [employer] or Organizations: No    Attends Banker Meetings: Never    Marital Status: Married  Catering manager Violence: Not At Risk (01/18/2024)   Humiliation, Afraid, Rape, and Kick questionnaire    Fear of Current or Ex-Partner: No    Emotionally Abused: No    Physically Abused: No    Sexually Abused: No    Family History  Problem Relation Age of Onset   Heart disease Mother    Rheum arthritis Mother    Colon cancer Neg Hx    Esophageal cancer Neg Hx    Stomach cancer Neg Hx    Rectal cancer Neg Hx      Review of Systems  Constitutional: Negative.  Negative for chills, fever and malaise/fatigue.  HENT:  Positive for congestion. Negative for sore throat.   Eyes: Negative.   Respiratory:  Positive for cough. Negative for shortness of breath.   Cardiovascular:  Negative for chest pain and palpitations.  Gastrointestinal:  Negative for abdominal pain, diarrhea, nausea and vomiting.  Genitourinary: Negative.  Negative for dysuria and hematuria.  Skin:  Negative for rash.  Neurological: Negative.  Negative for dizziness and headaches.  All other systems reviewed and are negative.   Vitals:   04/24/24 1327  BP: (!) 154/88  Pulse: 88  Temp: 98 F (36.7 C)  SpO2: 96%    Physical Exam Vitals reviewed.  Constitutional:      Appearance: Normal appearance.  HENT:     Head: Normocephalic.     Right Ear: Tympanic membrane, ear canal and external ear normal.     Left Ear: Tympanic membrane, ear canal and external ear normal.     Nose: Congestion present.     Mouth/Throat:     Mouth: Mucous membranes are moist.     Pharynx: Oropharynx is clear.   Eyes:     Extraocular Movements: Extraocular movements intact.     Pupils: Pupils are  equal, round, and reactive to light.     Comments: Right medial  subconjunctival hemorrhage   Cardiovascular:     Rate and Rhythm: Normal rate and regular rhythm.     Pulses: Normal pulses.     Heart sounds: Normal heart sounds.  Pulmonary:     Effort: Pulmonary effort is normal.     Breath sounds: Normal breath sounds.   Skin:    General: Skin is warm and dry.     Capillary Refill: Capillary refill takes less than 2 seconds.   Neurological:     General: No focal deficit present.     Mental Status: She is alert and oriented to person, place, and time.   Psychiatric:        Mood and Affect: Mood normal.        Behavior: Behavior normal.    Results for orders placed or performed in visit on 04/24/24 (from the past 24 hours)  POC COVID-19     Status: None   Collection Time: 04/24/24  2:20 PM  Result Value Ref Range   SARS Coronavirus 2 Ag Negative Negative  POCT Influenza A/B     Status: None   Collection Time: 04/24/24  2:20 PM  Result Value Ref Range   Influenza A, POC Negative Negative   Influenza B, POC Negative Negative      ASSESSMENT & PLAN: A total of 40 minutes was spent with the patient and counseling/coordination of care regarding preparing for this visit, review of most recent office visit notes, review of multiple chronic medical conditions and their management, diagnosis of acute bacterial sinusitis and need for antibiotics, symptom management, review of all medications, review of most recent bloodwork results, review of health maintenance items, education on nutrition, prognosis, documentation, and need for follow up.  Problem List Items Addressed This Visit       Cardiovascular and Mediastinum   Essential hypertension   BP Readings from Last 3 Encounters:  04/24/24 (!) 154/88  04/17/24 136/84  01/18/24 138/78  Elevated blood pressure in the office today but normal readings at home Cardiovascular risks associated with hypertension discussed. Diet and  nutrition discussed. Continue valsartan  HCT 160-12.5 mg daily          Respiratory   Bacterial sinusitis - Primary   Still active and affecting quality of life Just finished course of doxycycline  Recommend to continue with Augmentin  875 mg twice a day for 7 more days Symptom management discussed Advised to contact the office if no better or worse during the next several days      Relevant Medications   amoxicillin -clavulanate (AUGMENTIN ) 875-125 MG tablet   fluticasone (FLONASE) 50 MCG/ACT nasal spray     Genitourinary   Stage 3a chronic kidney disease (HCC)   Stable.  Advised to stay well-hydrated and avoid NSAIDs. Was able to start Jardiance  10 mg daily        Other   Dyslipidemia   Stable chronic condition Intolerant to statins Lipid profile done today Diet and nutrition discussed Did not tolerate Zetia  either due to joint pains.      Current smoker   Cardiovascular and cancer risks associated with smoking discussed Smoking cessation advice given      Flu-like symptoms   Symptom management discussed Advised to rest and stay well-hydrated Continue Tylenol  for headaches and facial pain as needed Advised to contact the office if no better or worse during the next several days.      Relevant Medications   fluticasone (FLONASE) 50 MCG/ACT nasal spray   Patient Instructions  Sinus Infection,  Adult A sinus infection is soreness and swelling (inflammation) of your sinuses. Sinuses are hollow spaces in the bones around your face. They are located: Around your eyes. In the middle of your forehead. Behind your nose. In your cheekbones. Your sinuses and nasal passages are lined with a fluid called mucus. Mucus drains out of your sinuses. Swelling can trap mucus in your sinuses. This lets germs (bacteria, virus, or fungus) grow, which leads to infection. Most of the time, this condition is caused by a virus. What are the causes? Allergies. Asthma. Germs. Things  that block your nose or sinuses. Growths in the nose (nasal polyps). Chemicals or irritants in the air. A fungus. This is rare. What increases the risk? Having a weak body defense system (immune system). Doing a lot of swimming or diving. Using nasal sprays too much. Smoking. What are the signs or symptoms? The main symptoms of this condition are pain and a feeling of pressure around the sinuses. Other symptoms include: Stuffy nose (congestion). This may make it hard to breathe through your nose. Runny nose (drainage). Soreness, swelling, and warmth in the sinuses. A cough that may get worse at night. Being unable to smell and taste. Mucus that collects in the throat or the back of the nose (postnasal drip). This may cause a sore throat or bad breath. Being very tired (fatigued). A fever. How is this diagnosed? Your symptoms. Your medical history. A physical exam. Tests to find out if your condition is short-term (acute) or long-term (chronic). Your doctor may: Check your nose for growths (polyps). Check your sinuses using a tool that has a light on one end (endoscope). Check for allergies or germs. Do imaging tests, such as an MRI or CT scan. How is this treated? Treatment for this condition depends on the cause and whether it is short-term or long-term. If caused by a virus, your symptoms should go away on their own within 10 days. You may be given medicines to relieve symptoms. They include: Medicines that shrink swollen tissue in the nose. A spray that treats swelling of the nostrils. Rinses that help get rid of thick mucus in your nose (nasal saline washes). Medicines that treat allergies (antihistamines). Over-the-counter pain relievers. If caused by bacteria, your doctor may wait to see if you will get better without treatment. You may be given antibiotic medicine if you have: A very bad infection. A weak body defense system. If caused by growths in the nose, surgery may  be needed. Follow these instructions at home: Medicines Take, use, or apply over-the-counter and prescription medicines only as told by your doctor. These may include nasal sprays. If you were prescribed an antibiotic medicine, take it as told by your doctor. Do not stop taking it even if you start to feel better. Hydrate and humidify  Drink enough water to keep your pee (urine) pale yellow. Use a cool mist humidifier to keep the humidity level in your home above 50%. Breathe in steam for 10-15 minutes, 3-4 times a day, or as told by your doctor. You can do this in the bathroom while a hot shower is running. Try not to spend time in cool or dry air. Rest Rest as much as you can. Sleep with your head raised (elevated). Make sure you get enough sleep each night. General instructions  Put a warm, moist washcloth on your face 3-4 times a day, or as often as told by your doctor. Use nasal saline washes as often as told  by your doctor. Wash your hands often with soap and water. If you cannot use soap and water, use hand sanitizer. Do not smoke. Avoid being around people who are smoking (secondhand smoke). Keep all follow-up visits. Contact a doctor if: You have a fever. Your symptoms get worse. Your symptoms do not get better within 10 days. Get help right away if: You have a very bad headache. You cannot stop vomiting. You have very bad pain or swelling around your face or eyes. You have trouble seeing. You feel confused. Your neck is stiff. You have trouble breathing. These symptoms may be an emergency. Get help right away. Call 911. Do not wait to see if the symptoms will go away. Do not drive yourself to the hospital. Summary A sinus infection is swelling of your sinuses. Sinuses are hollow spaces in the bones around your face. This condition is caused by tissues in your nose that become inflamed or swollen. This traps germs. These can lead to infection. If you were prescribed  an antibiotic medicine, take it as told by your doctor. Do not stop taking it even if you start to feel better. Keep all follow-up visits. This information is not intended to replace advice given to you by your health care provider. Make sure you discuss any questions you have with your health care provider. Document Revised: 09/16/2021 Document Reviewed: 09/16/2021 Elsevier Patient Education  2024 Elsevier Inc.      Emil Schaumann, MD [facility] at [facility]

## 2024-04-24 NOTE — Assessment & Plan Note (Signed)
 Still active and affecting quality of life Just finished course of doxycycline  Recommend to continue with Augmentin  875 mg twice a day for 7 more days Symptom management discussed Advised to contact the office if no better or worse during the next several days

## 2024-04-24 NOTE — Assessment & Plan Note (Signed)
 BP Readings from Last 3 Encounters:  04/24/24 (!) 154/88  04/17/24 136/84  01/18/24 138/78  Elevated blood pressure in the office today but normal readings at home Cardiovascular risks associated with hypertension discussed. Diet and nutrition discussed. Continue valsartan  HCT 160-12.5 mg daily

## 2024-05-01 ENCOUNTER — Ambulatory Visit (INDEPENDENT_AMBULATORY_CARE_PROVIDER_SITE_OTHER)

## 2024-05-01 ENCOUNTER — Encounter: Payer: Self-pay | Admitting: Podiatry

## 2024-05-01 ENCOUNTER — Ambulatory Visit (INDEPENDENT_AMBULATORY_CARE_PROVIDER_SITE_OTHER): Admitting: Podiatry

## 2024-05-01 ENCOUNTER — Ambulatory Visit: Admitting: Podiatry

## 2024-05-01 DIAGNOSIS — M25571 Pain in right ankle and joints of right foot: Secondary | ICD-10-CM | POA: Diagnosis not present

## 2024-05-01 DIAGNOSIS — M25474 Effusion, right foot: Secondary | ICD-10-CM

## 2024-05-01 MED ORDER — TRIAMCINOLONE ACETONIDE 10 MG/ML IJ SUSP
10.0000 mg | Freq: Once | INTRAMUSCULAR | Status: AC
  Administered 2024-05-01: 10 mg

## 2024-05-01 NOTE — Progress Notes (Signed)
 Patient presents with complaint of pain around the first metatarsal phalangeal joint on the right foot.  She has been having pain for the past several weeks and it.  Originally was very red and swollen.  Still pain but not as painful but still quite painful with walking and still has swelling around the joint.  Does not recall any injury to the joint.  No F/C or N/B.  Physical exam:  General appearance: Pleasant, and in no acute distress. AOx3.  Vascular: Pedal pulses: DP 2/4 bilaterally, PT 2/4 bilaterally.  Mild edema lower legs bilaterally. Capillary fill time immediate bilaterally.  Edema around first metatarsal phalangeal joint right  Neurological: Light touch intact feet bilaterally.  Normal Achilles reflex bilaterally.  No clonus or spasticity noted.   Dermatologic:   Skin normal temperature bilaterally.  Skin normal color, tone, and texture bilaterally.   Musculoskeletal: Tenderness to palpation around the first metatarsal phalangeal joint dorsally medially and plantarly.  Tenderness with range of motion of the first MTP although it is difficult to assess full motion since she is splinting quite a bit.  Radiographs: 3 views right foot: Status post [place] bunionectomy right.  Hardware in place with no loosening noted.  Possibly some joint space narrowing at the first metatarsal phalangeal joint.  No evidence of any fractures or dislocations.  No erosive changes noted at first metatarsal phalangeal joint.  Diagnosis: 1.  Arthralgia first metatarsal phalangeal joint right foot. 2.  Swelling at first metatarsal phalangeal joint right  Plan: -Discussed with her pain at the joint.  Said it was red or earlier so it could possibly be a acute gout flare.  Will check her uric acid.  I discussed osteoarthritis versus gout in the joint.  From her history it sounds like he could have gout. -Labs ordered for uric acid and CMP -injected 3cc 2:1 mixture 0.5 cc Marcaine :Kenolog 10mg /23ml at  metatarsal phalangeal joint right  Return 1 week follow-up injection right.

## 2024-05-01 NOTE — Addendum Note (Signed)
 Addended by: GIB MABLE SAUNDERS on: 05/01/2024 05:04 PM   Modules accepted: Orders

## 2024-05-02 ENCOUNTER — Other Ambulatory Visit: Payer: Self-pay | Admitting: Podiatry

## 2024-05-02 DIAGNOSIS — M25474 Effusion, right foot: Secondary | ICD-10-CM | POA: Diagnosis not present

## 2024-05-03 LAB — COMPREHENSIVE METABOLIC PANEL WITH GFR
ALT: 23 IU/L (ref 0–32)
AST: 34 IU/L (ref 0–40)
Albumin: 4.3 g/dL (ref 3.9–4.9)
Alkaline Phosphatase: 53 IU/L (ref 44–121)
BUN/Creatinine Ratio: 10 — ABNORMAL LOW (ref 12–28)
BUN: 11 mg/dL (ref 8–27)
Bilirubin Total: 0.8 mg/dL (ref 0.0–1.2)
CO2: 26 mmol/L (ref 20–29)
Calcium: 7.8 mg/dL — ABNORMAL LOW (ref 8.7–10.3)
Chloride: 102 mmol/L (ref 96–106)
Creatinine, Ser: 1.15 mg/dL — ABNORMAL HIGH (ref 0.57–1.00)
Globulin, Total: 2.5 g/dL (ref 1.5–4.5)
Glucose: 75 mg/dL (ref 70–99)
Potassium: 3 mmol/L — ABNORMAL LOW (ref 3.5–5.2)
Sodium: 144 mmol/L (ref 134–144)
Total Protein: 6.8 g/dL (ref 6.0–8.5)
eGFR: 52 mL/min/1.73 — ABNORMAL LOW (ref >59)

## 2024-05-03 LAB — URIC ACID: Uric Acid: 4.3 mg/dL (ref 3.0–7.2)

## 2024-05-08 ENCOUNTER — Encounter: Payer: Self-pay | Admitting: Podiatry

## 2024-05-08 ENCOUNTER — Ambulatory Visit (INDEPENDENT_AMBULATORY_CARE_PROVIDER_SITE_OTHER): Admitting: Podiatry

## 2024-05-08 DIAGNOSIS — M25571 Pain in right ankle and joints of right foot: Secondary | ICD-10-CM | POA: Diagnosis not present

## 2024-05-08 MED ORDER — PREDNISONE 5 MG PO TABS: ORAL_TABLET | ORAL | 0 refills | Status: AC

## 2024-05-08 NOTE — Progress Notes (Signed)
 Presents today follow-up first MTP injection for arthralgia possible gout.  Doing much better today.  Still has a little bit of soreness when she palpates she feels tenderness on the top of the joint.  No redness noticed.   Physical exam:  General appearance: Pleasant, and in no acute distress. AOx3.  Vascular: Pedal pulses: DP 2/4 bilaterally, PT 2/4 bilaterally.  Mild edema lower legs bilaterally. Capillary fill time immediate bilateral.  Neurological: Grossly intact bilaterally  Dermatologic:   Skin normal temperature bilaterally.  Skin normal color, tone, and texture bilaterally.   Musculoskeletal: Some tenderness at dorsal aspect first metatarsal phalangeal joint.  Some tenderness at end range of motion first MTP right.    Diagnosis: 1.  Arthralgia first metatarsal phalangeal joint right  Plan: -Established office visit for evaluation and management.  Level 3 - Rx prednisone  5 mg, 30 mg p.o. daily first day and then decreasing dose by 5 mg every other day for 12 days total -Ice 20 minutes an hour as needed -Discussed uric acid which was 4.3 which is a within good range for gout.  At this point we will hold off on any allopurinol or colchicine .  If she continues to have flares we might need to consider this.  Return 2 weeks follow-up

## 2024-05-13 ENCOUNTER — Other Ambulatory Visit: Payer: Self-pay | Admitting: Emergency Medicine

## 2024-05-13 DIAGNOSIS — E876 Hypokalemia: Secondary | ICD-10-CM

## 2024-05-18 ENCOUNTER — Ambulatory Visit: Admitting: Podiatry

## 2024-05-25 ENCOUNTER — Encounter: Payer: Self-pay | Admitting: Podiatry

## 2024-05-25 ENCOUNTER — Ambulatory Visit (INDEPENDENT_AMBULATORY_CARE_PROVIDER_SITE_OTHER): Admitting: Podiatry

## 2024-05-25 DIAGNOSIS — M25571 Pain in right ankle and joints of right foot: Secondary | ICD-10-CM | POA: Diagnosis not present

## 2024-05-25 NOTE — Progress Notes (Signed)
 Patient presents follow-up arthralgia first MTP secondary to gout.  Doing much better little bit aching at times but otherwise is doing very well.   Physical exam:  General appearance: Pleasant, and in no acute distress. AOx3.  Vascular: Pedal pulses: DP 2/4 bilaterally, PT 2/4 bilaterally.  Mild edema lower legs bilaterally. Capillary fill time immediate.  Neurological: Grossly intact bilaterally  Dermatologic:   Skin normal temperature bilaterally.  Skin normal color, tone, and texture bilaterally.   Musculoskeletal: Slight soreness on the dorsal aspect of the first MTP with palpation.  No tenderness with range of motion of the first MTP.  No synovial thickening noted   Diagnosis: 1.  Arthralgia first MTP right  Plan: -Established office visit for evaluation and management.  Level 3. - Recommend icing and diclofenac  gel as needed.  Wear good supportive shoes.  Again I discussed with him that at this point given that uric acid is under 5 if he does get another flare even with the uric acid being low and colchicine .   Return as needed

## 2024-06-01 DIAGNOSIS — H40053 Ocular hypertension, bilateral: Secondary | ICD-10-CM | POA: Diagnosis not present

## 2024-06-20 ENCOUNTER — Other Ambulatory Visit: Payer: Self-pay | Admitting: Emergency Medicine

## 2024-06-20 DIAGNOSIS — K219 Gastro-esophageal reflux disease without esophagitis: Secondary | ICD-10-CM

## 2024-06-27 DIAGNOSIS — Z79899 Other long term (current) drug therapy: Secondary | ICD-10-CM | POA: Diagnosis not present

## 2024-06-28 DIAGNOSIS — H40053 Ocular hypertension, bilateral: Secondary | ICD-10-CM | POA: Diagnosis not present

## 2024-07-04 ENCOUNTER — Other Ambulatory Visit: Payer: Self-pay | Admitting: Emergency Medicine

## 2024-07-04 DIAGNOSIS — E785 Hyperlipidemia, unspecified: Secondary | ICD-10-CM

## 2024-07-18 ENCOUNTER — Encounter: Payer: Self-pay | Admitting: Emergency Medicine

## 2024-07-18 ENCOUNTER — Ambulatory Visit: Admitting: Emergency Medicine

## 2024-07-18 ENCOUNTER — Ambulatory Visit: Payer: Self-pay | Admitting: Emergency Medicine

## 2024-07-18 VITALS — BP 120/78 | HR 77 | Temp 98.6°F | Ht 67.0 in | Wt 185.0 lb

## 2024-07-18 DIAGNOSIS — Z23 Encounter for immunization: Secondary | ICD-10-CM

## 2024-07-18 DIAGNOSIS — N1831 Chronic kidney disease, stage 3a: Secondary | ICD-10-CM

## 2024-07-18 DIAGNOSIS — K219 Gastro-esophageal reflux disease without esophagitis: Secondary | ICD-10-CM

## 2024-07-18 DIAGNOSIS — E876 Hypokalemia: Secondary | ICD-10-CM

## 2024-07-18 DIAGNOSIS — E785 Hyperlipidemia, unspecified: Secondary | ICD-10-CM | POA: Diagnosis not present

## 2024-07-18 DIAGNOSIS — I129 Hypertensive chronic kidney disease with stage 1 through stage 4 chronic kidney disease, or unspecified chronic kidney disease: Secondary | ICD-10-CM

## 2024-07-18 DIAGNOSIS — F17200 Nicotine dependence, unspecified, uncomplicated: Secondary | ICD-10-CM

## 2024-07-18 DIAGNOSIS — M150 Primary generalized (osteo)arthritis: Secondary | ICD-10-CM | POA: Diagnosis not present

## 2024-07-18 DIAGNOSIS — I10 Essential (primary) hypertension: Secondary | ICD-10-CM | POA: Diagnosis not present

## 2024-07-18 DIAGNOSIS — R195 Other fecal abnormalities: Secondary | ICD-10-CM | POA: Diagnosis not present

## 2024-07-18 DIAGNOSIS — E039 Hypothyroidism, unspecified: Secondary | ICD-10-CM

## 2024-07-18 LAB — CBC WITH DIFFERENTIAL/PLATELET
Basophils Absolute: 0.1 K/uL (ref 0.0–0.1)
Basophils Relative: 1.1 % (ref 0.0–3.0)
Eosinophils Absolute: 0.1 K/uL (ref 0.0–0.7)
Eosinophils Relative: 2.4 % (ref 0.0–5.0)
HCT: 39.7 % (ref 36.0–46.0)
Hemoglobin: 13.1 g/dL (ref 12.0–15.0)
Lymphocytes Relative: 29.7 % (ref 12.0–46.0)
Lymphs Abs: 1.7 K/uL (ref 0.7–4.0)
MCHC: 32.9 g/dL (ref 30.0–36.0)
MCV: 87.1 fl (ref 78.0–100.0)
Monocytes Absolute: 0.4 K/uL (ref 0.1–1.0)
Monocytes Relative: 7.4 % (ref 3.0–12.0)
Neutro Abs: 3.4 K/uL (ref 1.4–7.7)
Neutrophils Relative %: 59.4 % (ref 43.0–77.0)
Platelets: 280 K/uL (ref 150.0–400.0)
RBC: 4.55 Mil/uL (ref 3.87–5.11)
RDW: 14.9 % (ref 11.5–15.5)
WBC: 5.7 K/uL (ref 4.0–10.5)

## 2024-07-18 LAB — LIPID PANEL
Cholesterol: 218 mg/dL — ABNORMAL HIGH (ref 0–200)
HDL: 42.6 mg/dL (ref >39.00)
LDL Cholesterol: 144 mg/dL — ABNORMAL HIGH (ref 0–99)
NonHDL: 175.13
Total CHOL/HDL Ratio: 5
Triglycerides: 155 mg/dL — ABNORMAL HIGH (ref 0.0–149.0)
VLDL: 31 mg/dL (ref 0.0–40.0)

## 2024-07-18 LAB — COMPREHENSIVE METABOLIC PANEL WITH GFR
ALT: 14 U/L (ref 0–35)
AST: 17 U/L (ref 0–37)
Albumin: 4.2 g/dL (ref 3.5–5.2)
Alkaline Phosphatase: 42 U/L (ref 39–117)
BUN: 20 mg/dL (ref 6–23)
CO2: 24 meq/L (ref 19–32)
Calcium: 9.8 mg/dL (ref 8.4–10.5)
Chloride: 106 meq/L (ref 96–112)
Creatinine, Ser: 1.24 mg/dL — ABNORMAL HIGH (ref 0.40–1.20)
GFR: 44.85 mL/min — ABNORMAL LOW (ref >60.00)
Glucose, Bld: 92 mg/dL (ref 70–99)
Potassium: 3.9 meq/L (ref 3.5–5.1)
Sodium: 138 meq/L (ref 135–145)
Total Bilirubin: 0.9 mg/dL (ref 0.2–1.2)
Total Protein: 7.2 g/dL (ref 6.0–8.3)

## 2024-07-18 LAB — TSH: TSH: 0.38 u[IU]/mL (ref 0.35–5.50)

## 2024-07-18 LAB — HEMOGLOBIN A1C: Hgb A1c MFr Bld: 6.3 % (ref 4.6–6.5)

## 2024-07-18 MED ORDER — AMLODIPINE BESYLATE-VALSARTAN 5-160 MG PO TABS: 1.0000 | ORAL_TABLET | Freq: Every day | ORAL | 3 refills | Status: DC

## 2024-07-18 NOTE — Assessment & Plan Note (Signed)
Clinically euthyroid.  TSH done today. Continue levothyroxine 112 mcg daily.

## 2024-07-18 NOTE — Assessment & Plan Note (Signed)
 Stable.  Advised to stay well-hydrated and avoid NSAIDs. Was able to start Jardiance  10 mg daily

## 2024-07-18 NOTE — Assessment & Plan Note (Signed)
Clinically stable.  Asymptomatic. Continues omeprazole 20 mg daily

## 2024-07-18 NOTE — Assessment & Plan Note (Signed)
 Colonoscopy report from 2023 reviewed No cancer Positive polyps and hemorrhoids

## 2024-07-18 NOTE — Assessment & Plan Note (Signed)
 BP Readings from Last 3 Encounters:  07/18/24 120/78  04/24/24 (!) 154/88  04/17/24 136/84  Well-controlled hypertension However patient has history of chronic hypokalemia Recommend to stay away from diuretics Stop hydrochlorothiazide  Instead start valsartan -amlodipine  160-5 mg daily Monitor blood pressure readings at home daily for the next several weeks and contact the office if numbers persistently abnormal Blood work today Follow-up in 6 months

## 2024-07-18 NOTE — Progress Notes (Signed)
 Erika Cervantes 68 y.o.   Chief Complaint  Patient presents with   Follow-up    Patient here for 6 month f/u for HTN. Patient mentions that here recently she's been more tired than usual     HISTORY OF PRESENT ILLNESS: This is a 68 y.o. female here for 54-month follow-up of chronic medical conditions including hypertension Has history of chronic hypokalemia and has been taking valsartan  HCTZ for a while Smoking a lot less.  About 3 cigarettes/week depending on stress. Upper respiratory infection a couple weeks ago.  Still recovering from it with occasional fatigue and malaise. No other complaints or medical concerns today.  HPI   Prior to Admission medications   Medication Sig Start Date End Date Taking? Authorizing Provider  acetaminophen  (TYLENOL ) 650 MG CR tablet Take 650 mg by mouth every 8 (eight) hours as needed for pain.   Yes [provider]  albuterol  (VENTOLIN  HFA) 108 (90 Base) MCG/ACT inhaler Inhale 2 puffs into the lungs every 6 (six) hours as needed. 01/04/24  Yes Henson, Vickie L, NP-C  colchicine  0.6 MG tablet Take 1 tablet (0.6 mg total) by mouth daily as needed. 09/11/22  Yes Rice, Lonni ORN, MD  ELDERBERRY PO Take 1 capsule by mouth daily.   Yes [provider]  empagliflozin  (JARDIANCE ) 10 MG TABS tablet Take 1 tablet (10 mg total) by mouth daily before breakfast. 01/18/24  Yes Tray Klayman, Emil Schanz, MD  fluticasone  (FLONASE ) 50 MCG/ACT nasal spray Place 2 sprays into both nostrils daily. 04/24/24  Yes Aj Crunkleton, Emil Schanz, MD  folic acid  (FOLVITE ) 1 MG tablet Take 1 mg by mouth daily.   Yes [provider]  KLOR-CON  M20 20 MEQ tablet TAKE 1 TABLET BY MOUTH DAILY 05/13/24  Yes Gabbie Marzo, Emil Schanz, MD  levothyroxine  (SYNTHROID ) 125 MCG tablet TAKE 1 TABLET BY MOUTH DAILY BEFORE BREAKFAST 07/04/24  Yes Marlie Kuennen, Emil Schanz, MD  loratadine  (CLARITIN ) 10 MG tablet Take 10 mg by mouth daily as needed for allergies.   Yes [provider]   methotrexate (RHEUMATREX) 2.5 MG tablet Take 17.5 mg by mouth every Wednesday. 12/09/22  Yes [provider]  Misc Natural Products (CHOLESTEROL SUPPORT PO) Take 1 capsule by mouth daily.   Yes [provider]  ofloxacin (OCUFLOX) 0.3 % ophthalmic solution  07/06/23  Yes [provider]  omeprazole  (PRILOSEC) 20 MG capsule TAKE 1 CAPSULE BY MOUTH DAILY 06/20/24  Yes Dominic Mahaney Jose, MD  prednisoLONE acetate (PRED FORTE) 1 % ophthalmic suspension SMARTSIG:In Eye(s) 07/06/23  Yes [provider]  rosuvastatin  (CRESTOR ) 20 MG tablet Take 1 tablet (20 mg total) by mouth daily. 01/05/23  Yes Latissa Frick, Emil Schanz, MD  TURMERIC PO Take 1 capsule by mouth daily.   Yes [provider]  valsartan -hydrochlorothiazide  (DIOVAN -HCT) 160-12.5 MG tablet Take 1 tablet by mouth daily. 01/18/24  Yes Kaleea Penner, Emil Schanz, MD  benzonatate  (TESSALON ) 200 MG capsule Take 1 capsule (200 mg total) by mouth 2 (two) times daily as needed for cough. Patient not taking: Reported on 07/18/2024 01/04/24   Lendia Nordmann L, NP-C  ezetimibe  (ZETIA ) 10 MG tablet TAKE 1 TABLET BY MOUTH DAILY Patient not taking: Reported on 07/18/2024 07/04/24   Verline Kong Jose, MD  predniSONE  (DELTASONE ) 5 MG tablet 12 day taper: 6,6,5,5,4,4,3,3,2,2,1,1 Patient not taking: Reported on 07/18/2024 05/08/24   Christine Rush, DPM  promethazine  (PHENERGAN ) 6.25 MG/5ML solution Take 5 mLs (6.25 mg total) by mouth every 6 (six) hours as needed for nausea or vomiting.  04/17/24   Alvia Corean CROME, FNP  tiZANidine  (ZANAFLEX ) 4 MG tablet Take 1 tablet (4 mg total) by mouth every 6 (six) hours as needed for muscle spasms. Patient not taking: Reported on 07/18/2024 04/28/23   Cabbell, Kyle, MD  trolamine salicylate (ASPERCREME) 10 % cream Apply 1 Application topically as needed for muscle pain.    [provider]    Allergies  Allergen Reactions   Darvocet [Propoxyphene N-Acetaminophen ] Nausea And  Vomiting    Patient Active Problem List   Diagnosis Date Noted   Statin intolerance 07/13/2023   Spondylolisthesis of lumbar region 04/27/2023   Chronic joint pain 09/16/2022   Positive colorectal cancer screening using Cologuard test 07/07/2022   Stage 3a chronic kidney disease (HCC) 01/08/2022   Current smoker 01/06/2022   Bilateral hand pain 08/28/2021   Positive ANA (antinuclear antibody) 08/28/2021   Primary osteoarthritis involving multiple joints 07/08/2021   Dyslipidemia 08/01/2019   Degenerative arthritis of thumb 09/04/2015   Vitamin D  deficiency 05/26/2012   Hypothyroidism 02/26/2012   Hyperlipidemia with target LDL less than 100 02/26/2012   Essential hypertension 02/26/2012   Obesity (BMI 30.0-34.9) 02/26/2012   Bulging lumbar disc 02/26/2012   GERD (gastroesophageal reflux disease) 02/26/2012    Past Medical History:  Diagnosis Date   Allergy    Arthritis    GERD (gastroesophageal reflux disease)    High cholesterol    Hypertension    Hypothyroidism    PONV (postoperative nausea and vomiting)    Thyroid  disease     Past Surgical History:  Procedure Laterality Date   ABDOMINAL HYSTERECTOMY  late 1980s   Fibroids were indication.  pt says also had SPO bil.     APPENDECTOMY  late 1980s   done at time of hysto.     BACK SURGERY  04/27/2023   BREAST SURGERY     Right breast mass   cataract surgery  05/2023   ESOPHAGOGASTRODUODENOSCOPY (EGD) WITH PROPOFOL  N/A 11/08/2018   Procedure: ESOPHAGOGASTRODUODENOSCOPY (EGD) WITH PROPOFOL ;  Surgeon: Teressa Toribio SQUIBB, MD;  Location: [facility] ENDOSCOPY;  Service: Endoscopy;  Laterality: N/A;   Hammer toes     TONSILLECTOMY      Social History   Socioeconomic History   Marital status: Married    Spouse name: Hollis   Number of children: 1   Years of education: Not on file   Highest education level: Not on file  Occupational History   Not on file  Tobacco Use   Smoking status: Every Day    Current packs/day: 0.10     Average packs/day: 0.1 packs/day for 22.0 years (2.2 ttl pk-yrs)    Types: Cigarettes   Smokeless tobacco: Never   Tobacco comments:    3 cigs daiy  Vaping Use   Vaping status: Never Used  Substance and Sexual Activity   Alcohol use: No   Drug use: No   Sexual activity: Not on file  Other Topics Concern   Not on file  Social History Narrative   1 biological child. 2 stepchildren      Lives at home with husband   Social Drivers of Health   Financial Resource Strain: Medium Risk (01/18/2024)   Overall Financial Resource Strain (CARDIA)    Difficulty of Paying Living Expenses: Somewhat hard  Food Insecurity: No Food Insecurity (01/18/2024)   Hunger Vital Sign    Worried About Running Out of Food in the Last Year: Never true    Ran Out of Food in the Last Year:  Never true  Transportation Needs: No Transportation Needs (01/18/2024)   PRAPARE - Administrator, Civil Service (Medical): No    Lack of Transportation (Non-Medical): No  Physical Activity: Inactive (01/18/2024)   Exercise Vital Sign    Days of Exercise per Week: 0 days    Minutes of Exercise per Session: 0 min  Stress: No Stress Concern Present (01/18/2024)   [employer] of Occupational Health - Occupational Stress Questionnaire    Feeling of Stress : Not at all  Social Connections: Moderately Integrated (01/18/2024)   Social Connection and Isolation Panel    Frequency of Communication with Friends and Family: More than three times a week    Frequency of Social Gatherings with Friends and Family: Twice a week    Attends Religious Services: More than 4 times per year    Active Member of [employer] or Organizations: No    Attends Banker Meetings: Never    Marital Status: Married  Catering manager Violence: Not At Risk (01/18/2024)   Humiliation, Afraid, Rape, and Kick questionnaire    Fear of Current or Ex-Partner: No    Emotionally Abused: No    Physically Abused: No    Sexually Abused: No     Family History  Problem Relation Age of Onset   Heart disease Mother    Rheum arthritis Mother    Colon cancer Neg Hx    Esophageal cancer Neg Hx    Stomach cancer Neg Hx    Rectal cancer Neg Hx      Review of Systems  Constitutional:  Positive for malaise/fatigue.  HENT: Negative.  Negative for congestion and sore throat.   Respiratory: Negative.  Negative for cough and shortness of breath.   Cardiovascular: Negative.  Negative for chest pain and palpitations.  Gastrointestinal:  Negative for abdominal pain, diarrhea, nausea and vomiting.  Genitourinary: Negative.  Negative for dysuria and hematuria.  Skin: Negative.  Negative for rash.  Neurological: Negative.  Negative for dizziness and headaches.  All other systems reviewed and are negative.   Vitals:   07/18/24 0955  BP: 120/78  Pulse: 77  Temp: 98.6 F (37 C)  SpO2: 98%    Physical Exam Vitals reviewed.  Constitutional:      Appearance: Normal appearance.  HENT:     Head: Normocephalic.     Mouth/Throat:     Mouth: Mucous membranes are moist.     Pharynx: Oropharynx is clear.  Eyes:     Extraocular Movements: Extraocular movements intact.     Conjunctiva/sclera: Conjunctivae normal.     Pupils: Pupils are equal, round, and reactive to light.  Cardiovascular:     Rate and Rhythm: Normal rate and regular rhythm.     Pulses: Normal pulses.     Heart sounds: Normal heart sounds.  Pulmonary:     Effort: Pulmonary effort is normal.     Breath sounds: Normal breath sounds.  Abdominal:     Palpations: Abdomen is soft.     Tenderness: There is no abdominal tenderness.  Musculoskeletal:     Cervical back: No tenderness.     Right lower leg: No edema.     Left lower leg: No edema.  Lymphadenopathy:     Cervical: No cervical adenopathy.  Skin:    General: Skin is warm and dry.     Capillary Refill: Capillary refill takes less than 2 seconds.  Neurological:     General: No focal deficit present.  Mental Status: She is alert and oriented to person, place, and time.  Psychiatric:        Mood and Affect: Mood normal.        Behavior: Behavior normal.      ASSESSMENT & PLAN: A total of 42 minutes was spent with the patient and counseling/coordination of care regarding preparing for this visit, review of most recent office visit notes, review of multiple chronic medical conditions and their management, review of all medications and changes made due to hypokalemia, review of most recent bloodwork results, review of health maintenance items, education on nutrition, prognosis, documentation, and need for follow up.   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  Asymptomatic. Continues omeprazole  20 mg daily        Endocrine   Hypothyroidism   Clinically euthyroid TSH done today Continue levothyroxine  112 mcg daily      Relevant Orders   TSH     Musculoskeletal and Integument   Primary osteoarthritis involving multiple joints   Sees rheumatologist on a regular basis Presently on methotrexate 17.5 mg weekly        Genitourinary   Stage 3a chronic kidney disease  (HCC)   Stable.  Advised to stay well-hydrated and avoid NSAIDs. Was able to start Jardiance  10 mg daily      Relevant Orders   Comprehensive metabolic panel with GFR     Other   Dyslipidemia   Stable chronic condition Intolerant to statins Lipid profile done today Diet and nutrition discussed Did not tolerate Zetia  either due to joint pains.      Current smoker   Smoking a lot less Cardiovascular and cancer risks associated with smoking discussed Smoking cessation advice given      Positive colorectal cancer screening using Cologuard test   Colonoscopy report from 2023 reviewed No cancer Positive polyps and hemorrhoids      Chronic hypokalemia   Hydrochlorothiazide  contributing Recommend to stop it Instead recommend valsartan -amlodipine  for blood pressure CMP done today Diet and nutrition discussed      Relevant Orders   Comprehensive metabolic panel with GFR   Other Visit Diagnoses       Need for vaccination       Relevant Orders   Flu vaccine HIGH DOSE PF(Fluzone Trivalent) (Completed)      Patient Instructions  Stop valsartan  HCTZ due to chronic low potassium Start valsartan  amlodipine  160-5 mg daily Monitor blood pressure readings at home daily for the next several weeks and contact the office if numbers persistently abnormal Blood work today Follow-up in 6 months.  Earlier as needed  Hypertension, Adult High blood pressure (hypertension) is when the force of blood pumping through the  arteries is too strong. The arteries are the blood vessels that carry blood from the heart throughout the body. Hypertension forces the heart to work harder to pump blood and may cause arteries to become narrow or stiff. Untreated or uncontrolled hypertension can lead to a heart attack, heart failure, a stroke, kidney disease, and other problems. A blood pressure reading consists of a higher number over a lower number. Ideally, your blood pressure should be below 120/80. The  first (top) number is called the systolic pressure. It is a measure of the pressure in your arteries as your heart beats. The second (bottom) number is called the diastolic pressure. It is a measure of the pressure in your arteries as the heart relaxes. What are the causes? The exact cause of this condition is not known. There are some conditions that result in high blood pressure. What increases the risk? Certain factors may make you more likely to develop high blood pressure. Some of these risk factors are under your control, including: Smoking. Not getting enough exercise or physical activity. Being overweight. Having too much fat, sugar, calories, or salt (sodium) in your diet. Drinking too much alcohol. Other risk factors include: Having a personal history of heart disease, diabetes, high cholesterol, or kidney disease. Stress. Having a family history of high blood pressure and high cholesterol. Having obstructive sleep apnea. Age. The risk increases with age. What are the signs or symptoms? High blood pressure may not cause symptoms. Very high blood pressure (hypertensive crisis) may cause: Headache. Fast or irregular heartbeats (palpitations). Shortness of breath. Nosebleed. Nausea and vomiting. Vision changes. Severe chest pain, dizziness, and seizures. How is this diagnosed? This condition is diagnosed by measuring your blood pressure while you are seated, with your arm resting on a flat surface, your legs uncrossed, and your feet flat on the floor. The cuff of the blood pressure monitor will be placed directly against the skin of your upper arm at the level of your heart. Blood pressure should be measured at least twice using the same arm. Certain conditions can cause a difference in blood pressure between your right and left arms. If you have a high blood pressure reading during one visit or you have normal blood pressure with other risk factors, you may be asked to: Return  on a different day to have your blood pressure checked again. Monitor your blood pressure at home for 1 week or longer. If you are diagnosed with hypertension, you may have other blood or imaging tests to help your health care provider understand your overall risk for other conditions. How is this treated? This condition is treated by making healthy lifestyle changes, such as eating healthy foods, exercising more, and reducing your alcohol intake. You may be referred for counseling on a healthy diet and physical activity. Your health care provider may prescribe medicine if lifestyle changes are not enough to get your blood pressure under control and if: Your systolic blood pressure is above 130. Your diastolic blood pressure is above 80. Your personal target blood pressure may vary depending on your medical conditions, your age, and other factors. Follow these instructions at home: Eating and drinking  Eat a diet that is high in fiber and potassium, and low in sodium, added sugar, and fat. An example of this eating plan is called the DASH diet. DASH stands for Dietary Approaches to Stop Hypertension. To eat this way: Eat plenty of fresh fruits and vegetables. Try to fill one half of your plate  at each meal with fruits and vegetables. Eat whole grains, such as whole-wheat pasta, brown rice, or whole-grain bread. Fill about one fourth of your plate with whole grains. Eat or drink low-fat dairy products, such as skim milk or low-fat yogurt. Avoid fatty cuts of meat, processed or cured meats, and poultry with skin. Fill about one fourth of your plate with lean proteins, such as fish, chicken without skin, beans, eggs, or tofu. Avoid pre-made and processed foods. These tend to be higher in sodium, added sugar, and fat. Reduce your daily sodium intake. Many people with hypertension should eat less than 1,500 mg of sodium a day. Do not drink alcohol if: Your health care provider tells you not to  drink. You are pregnant, may be pregnant, or are planning to become pregnant. If you drink alcohol: Limit how much you have to: 0-1 drink a day for women. 0-2 drinks a day for men. Know how much alcohol is in your drink. In the [place] one drink equals one 12 oz bottle of beer (355 mL), one 5 oz glass of wine (148 mL), or one 1 oz glass of hard liquor (44 mL). Lifestyle  Work with your health care provider to maintain a healthy body weight or to lose weight. Ask what an ideal weight is for you. Get at least 30 minutes of exercise that causes your heart to beat faster (aerobic exercise) most days of the week. Activities may include walking, swimming, or biking. Include exercise to strengthen your muscles (resistance exercise), such as Pilates or lifting weights, as part of your weekly exercise routine. Try to do these types of exercises for 30 minutes at least 3 days a week. Do not use any products that contain nicotine or tobacco. These products include cigarettes, chewing tobacco, and vaping devices, such as e-cigarettes. If you need help quitting, ask your health care provider. Monitor your blood pressure at home as told by your health care provider. Keep all follow-up visits. This is important. Medicines Take over-the-counter and prescription medicines only as told by your health care provider. Follow directions carefully. Blood pressure medicines must be taken as prescribed. Do not skip doses of blood pressure medicine. Doing this puts you at risk for problems and can make the medicine less effective. Ask your health care provider about side effects or reactions to medicines that you should watch for. Contact a health care provider if you: Think you are having a reaction to a medicine you are taking. Have headaches that keep coming back (recurring). Feel dizzy. Have swelling in your ankles. Have trouble with your vision. Get help right away if you: Develop a severe headache or  confusion. Have unusual weakness or numbness. Feel faint. Have severe pain in your chest or abdomen. Vomit repeatedly. Have trouble breathing. These symptoms may be an emergency. Get help right away. Call 911. Do not wait to see if the symptoms will go away. Do not drive yourself to the hospital. Summary Hypertension is when the force of blood pumping through your arteries is too strong. If this condition is not controlled, it may put you at risk for serious complications. Your personal target blood pressure may vary depending on your medical conditions, your age, and other factors. For most people, a normal blood pressure is less than 120/80. Hypertension is treated with lifestyle changes, medicines, or a combination of both. Lifestyle changes include losing weight, eating a healthy, low-sodium diet, exercising more, and limiting alcohol. This information is not intended to replace  advice given to you by your health care provider. Make sure you discuss any questions you have with your health care provider. Document Revised: 08/19/2021 Document Reviewed: 08/19/2021 Elsevier Patient Education  2024 Elsevier Inc.     Emil Schaumann, MD [facility]

## 2024-07-18 NOTE — Patient Instructions (Signed)
 Stop valsartan  HCTZ due to chronic low potassium Start valsartan  amlodipine  160-5 mg daily Monitor blood pressure readings at home daily for the next several weeks and contact the office if numbers persistently abnormal Blood work today Follow-up in 6 months.  Earlier as needed  Hypertension, Adult High blood pressure (hypertension) is when the force of blood pumping through the arteries is too strong. The arteries are the blood vessels that carry blood from the heart throughout the body. Hypertension forces the heart to work harder to pump blood and may cause arteries to become narrow or stiff. Untreated or uncontrolled hypertension can lead to a heart attack, heart failure, a stroke, kidney disease, and other problems. A blood pressure reading consists of a higher number over a lower number. Ideally, your blood pressure should be below 120/80. The first (top) number is called the systolic pressure. It is a measure of the pressure in your arteries as your heart beats. The second (bottom) number is called the diastolic pressure. It is a measure of the pressure in your arteries as the heart relaxes. What are the causes? The exact cause of this condition is not known. There are some conditions that result in high blood pressure. What increases the risk? Certain factors may make you more likely to develop high blood pressure. Some of these risk factors are under your control, including: Smoking. Not getting enough exercise or physical activity. Being overweight. Having too much fat, sugar, calories, or salt (sodium) in your diet. Drinking too much alcohol. Other risk factors include: Having a personal history of heart disease, diabetes, high cholesterol, or kidney disease. Stress. Having a family history of high blood pressure and high cholesterol. Having obstructive sleep apnea. Age. The risk increases with age. What are the signs or symptoms? High blood pressure may not cause symptoms.  Very high blood pressure (hypertensive crisis) may cause: Headache. Fast or irregular heartbeats (palpitations). Shortness of breath. Nosebleed. Nausea and vomiting. Vision changes. Severe chest pain, dizziness, and seizures. How is this diagnosed? This condition is diagnosed by measuring your blood pressure while you are seated, with your arm resting on a flat surface, your legs uncrossed, and your feet flat on the floor. The cuff of the blood pressure monitor will be placed directly against the skin of your upper arm at the level of your heart. Blood pressure should be measured at least twice using the same arm. Certain conditions can cause a difference in blood pressure between your right and left arms. If you have a high blood pressure reading during one visit or you have normal blood pressure with other risk factors, you may be asked to: Return on a different day to have your blood pressure checked again. Monitor your blood pressure at home for 1 week or longer. If you are diagnosed with hypertension, you may have other blood or imaging tests to help your health care provider understand your overall risk for other conditions. How is this treated? This condition is treated by making healthy lifestyle changes, such as eating healthy foods, exercising more, and reducing your alcohol intake. You may be referred for counseling on a healthy diet and physical activity. Your health care provider may prescribe medicine if lifestyle changes are not enough to get your blood pressure under control and if: Your systolic blood pressure is above 130. Your diastolic blood pressure is above 80. Your personal target blood pressure may vary depending on your medical conditions, your age, and other factors. Follow these instructions at home:  Eating and drinking  Eat a diet that is high in fiber and potassium, and low in sodium, added sugar, and fat. An example of this eating plan is called the DASH diet.  DASH stands for Dietary Approaches to Stop Hypertension. To eat this way: Eat plenty of fresh fruits and vegetables. Try to fill one half of your plate at each meal with fruits and vegetables. Eat whole grains, such as whole-wheat pasta, brown rice, or whole-grain bread. Fill about one fourth of your plate with whole grains. Eat or drink low-fat dairy products, such as skim milk or low-fat yogurt. Avoid fatty cuts of meat, processed or cured meats, and poultry with skin. Fill about one fourth of your plate with lean proteins, such as fish, chicken without skin, beans, eggs, or tofu. Avoid pre-made and processed foods. These tend to be higher in sodium, added sugar, and fat. Reduce your daily sodium intake. Many people with hypertension should eat less than 1,500 mg of sodium a day. Do not drink alcohol if: Your health care provider tells you not to drink. You are pregnant, may be pregnant, or are planning to become pregnant. If you drink alcohol: Limit how much you have to: 0-1 drink a day for women. 0-2 drinks a day for men. Know how much alcohol is in your drink. In the [place] one drink equals one 12 oz bottle of beer (355 mL), one 5 oz glass of wine (148 mL), or one 1 oz glass of hard liquor (44 mL). Lifestyle  Work with your health care provider to maintain a healthy body weight or to lose weight. Ask what an ideal weight is for you. Get at least 30 minutes of exercise that causes your heart to beat faster (aerobic exercise) most days of the week. Activities may include walking, swimming, or biking. Include exercise to strengthen your muscles (resistance exercise), such as Pilates or lifting weights, as part of your weekly exercise routine. Try to do these types of exercises for 30 minutes at least 3 days a week. Do not use any products that contain nicotine or tobacco. These products include cigarettes, chewing tobacco, and vaping devices, such as e-cigarettes. If you need help quitting, ask  your health care provider. Monitor your blood pressure at home as told by your health care provider. Keep all follow-up visits. This is important. Medicines Take over-the-counter and prescription medicines only as told by your health care provider. Follow directions carefully. Blood pressure medicines must be taken as prescribed. Do not skip doses of blood pressure medicine. Doing this puts you at risk for problems and can make the medicine less effective. Ask your health care provider about side effects or reactions to medicines that you should watch for. Contact a health care provider if you: Think you are having a reaction to a medicine you are taking. Have headaches that keep coming back (recurring). Feel dizzy. Have swelling in your ankles. Have trouble with your vision. Get help right away if you: Develop a severe headache or confusion. Have unusual weakness or numbness. Feel faint. Have severe pain in your chest or abdomen. Vomit repeatedly. Have trouble breathing. These symptoms may be an emergency. Get help right away. Call 911. Do not wait to see if the symptoms will go away. Do not drive yourself to the hospital. Summary Hypertension is when the force of blood pumping through your arteries is too strong. If this condition is not controlled, it may put you at risk for serious complications. Your personal  target blood pressure may vary depending on your medical conditions, your age, and other factors. For most people, a normal blood pressure is less than 120/80. Hypertension is treated with lifestyle changes, medicines, or a combination of both. Lifestyle changes include losing weight, eating a healthy, low-sodium diet, exercising more, and limiting alcohol. This information is not intended to replace advice given to you by your health care provider. Make sure you discuss any questions you have with your health care provider. Document Revised: 08/19/2021 Document Reviewed:  08/19/2021 Elsevier Patient Education  2024 [employer].

## 2024-07-18 NOTE — Assessment & Plan Note (Signed)
Sees rheumatologist on a regular basis Presently on methotrexate 17.5 mg weekly

## 2024-07-18 NOTE — Assessment & Plan Note (Signed)
 Smoking a lot less Cardiovascular and cancer risks associated with smoking discussed Smoking cessation advice given

## 2024-07-18 NOTE — Assessment & Plan Note (Signed)
 Hydrochlorothiazide  contributing Recommend to stop it Instead recommend valsartan -amlodipine  for blood pressure CMP done today Diet and nutrition discussed

## 2024-07-18 NOTE — Assessment & Plan Note (Signed)
 Stable chronic condition Intolerant to statins Lipid profile done today Diet and nutrition discussed Did not tolerate Zetia either due to joint pains.

## 2024-07-25 DIAGNOSIS — Z79899 Other long term (current) drug therapy: Secondary | ICD-10-CM | POA: Diagnosis not present

## 2024-07-25 DIAGNOSIS — M1990 Unspecified osteoarthritis, unspecified site: Secondary | ICD-10-CM | POA: Diagnosis not present

## 2024-07-25 DIAGNOSIS — M1180 Other specified crystal arthropathies, unspecified site: Secondary | ICD-10-CM | POA: Diagnosis not present

## 2024-07-25 DIAGNOSIS — M0579 Rheumatoid arthritis with rheumatoid factor of multiple sites without organ or systems involvement: Secondary | ICD-10-CM | POA: Diagnosis not present

## 2024-08-03 ENCOUNTER — Ambulatory Visit: Payer: Self-pay

## 2024-08-03 NOTE — Telephone Encounter (Signed)
 FYI Only or Action Required?: FYI only for provider.  Patient was last seen in primary care on 07/18/2024 by Purcell Emil Schanz, MD.  Called Nurse Triage reporting Otalgia.  Symptoms began x 2 days.  Interventions attempted: Nothing.  Symptoms are: gradually worsening.  Triage Disposition: See Physician Within 24 Hours  Patient/caregiver understands and will follow disposition?: Yes    Copied from CRM #[id number]. Topic: Clinical - Red Word Triage >> Aug 03, 2024  2:05 PM [place] A wrote: [facility] that prompted transfer to Nurse Triage: Patient has pain in left ear since yesterday Reason for Disposition  Earache  (Exceptions: Brief ear pain of lasting less than 60 minutes, or earache occurring during air travel.)  Answer Assessment - Initial Assessment Questions 1. LOCATION: Which ear is involved?     Left ear and radiates up to head 2. ONSET: When did the ear pain start?      X 2 days 3. SEVERITY: How bad is the pain?  (Scale 1-10; mild, moderate or severe)     Moderate to severe 4. URI SYMPTOMS: Do you have a runny nose or cough?     Runny nose 5. FEVER: Do you have a fever? If Yes, ask: What is your temperature, how was it measured, and when did it start?     no 6. CAUSE: Have you been swimming recently?, How often do you use Q-TIPS?, Have you had any recent air travel or scuba diving?      no 7. OTHER SYMPTOMS: Do you have any other symptoms? (e.g., decreased hearing, dizziness, headache, stiff neck, vomiting)     headache 8. PREGNANCY: Is there any chance you are pregnant? When was your last menstrual period?     na  Protocols used: [place]

## 2024-08-04 ENCOUNTER — Ambulatory Visit (INDEPENDENT_AMBULATORY_CARE_PROVIDER_SITE_OTHER): Admitting: Family Medicine

## 2024-08-04 ENCOUNTER — Encounter: Payer: Self-pay | Admitting: Family Medicine

## 2024-08-04 VITALS — BP 130/60 | HR 72 | Temp 98.4°F | Ht 67.0 in | Wt 185.6 lb

## 2024-08-04 DIAGNOSIS — H66003 Acute suppurative otitis media without spontaneous rupture of ear drum, bilateral: Secondary | ICD-10-CM

## 2024-08-04 DIAGNOSIS — H66002 Acute suppurative otitis media without spontaneous rupture of ear drum, left ear: Secondary | ICD-10-CM | POA: Insufficient documentation

## 2024-08-04 DIAGNOSIS — H6090 Unspecified otitis externa, unspecified ear: Secondary | ICD-10-CM | POA: Insufficient documentation

## 2024-08-04 DIAGNOSIS — H9203 Otalgia, bilateral: Secondary | ICD-10-CM | POA: Diagnosis not present

## 2024-08-04 DIAGNOSIS — H60393 Other infective otitis externa, bilateral: Secondary | ICD-10-CM

## 2024-08-04 DIAGNOSIS — H9202 Otalgia, left ear: Secondary | ICD-10-CM | POA: Insufficient documentation

## 2024-08-04 MED ORDER — AMOXICILLIN-POT CLAVULANATE 875-125 MG PO TABS: 1.0000 | ORAL_TABLET | Freq: Two times a day (BID) | ORAL | 0 refills | Status: AC

## 2024-08-04 MED ORDER — NEOMYCIN-POLYMYXIN-HC 3.5-10000-1 OT SOLN: 3.0000 [drp] | Freq: Three times a day (TID) | OTIC | 0 refills | Status: AC

## 2024-08-04 NOTE — Progress Notes (Signed)
 Acute Office Visit  Subjective:     Patient ID: Erika Cervantes, female    DOB: 11-Oct-1956, 68 y.o.   MRN: [id number]  Chief Complaint  Patient presents with   Acute Visit    Left ear pain x3 days, has used otc ear drops and tylenol  but not helping    HPI  Discussed the use of AI scribe software for clinical note transcription with the patient, who gave verbal consent to proceed.  History of Present Illness Erika Cervantes is a 68 year old female who presents with bilateral ear pain and swelling.  Otalgia and auricular swelling - Bilateral ear pain onset yesterday - Pain characterized as shooting up to the head, especially when lying down - Associated bilateral ear swelling  Pruritus and pulsatile sensation - Itchiness in both ears preceding onset of pain - Pulsatile sensation in ears when lying down      ROS Per HPI      Objective:    BP 130/60 (BP Location: Left Arm, Patient Position: Sitting)   Pulse 72   Temp 98.4 F (36.9 C) (Oral)   Ht 5' 7 (1.702 m)   Wt 185 lb 9.6 oz (84.2 kg)   SpO2 98%   BMI 29.07 kg/m    Physical Exam Vitals and nursing note reviewed.  Constitutional:      General: She is not in acute distress.    Appearance: Normal appearance.  HENT:     Head: Normocephalic and atraumatic.     Right Ear: Swelling and tenderness present. A middle ear effusion is present. Tympanic membrane is erythematous and bulging.     Left Ear: Swelling and tenderness present. A middle ear effusion is present. Tympanic membrane is erythematous and bulging.     Ears:     Comments: Both the ears tender to auricular manipulation, bilateral ear canals erythematous and mildly swollen, bilateral TMs erythematous, bulging with effusion    Nose: Nose normal.     Mouth/Throat:     Mouth: Mucous membranes are moist.     Pharynx: Oropharynx is clear.  Eyes:     Extraocular Movements: Extraocular movements intact.     Pupils: Pupils are equal, round, and reactive to  light.  Cardiovascular:     Rate and Rhythm: Normal rate and regular rhythm.  Pulmonary:     Effort: Pulmonary effort is normal.  Musculoskeletal:        General: Normal range of motion.     Cervical back: Normal range of motion.     Right lower leg: No edema.     Left lower leg: No edema.  Lymphadenopathy:     Cervical: Cervical adenopathy present.  Neurological:     General: No focal deficit present.     Mental Status: She is alert and oriented to person, place, and time.  Psychiatric:        Mood and Affect: Mood normal.        Thought Content: Thought content normal.     No results found for any visits on 08/04/24.      Assessment & Plan:   Assessment and Plan Assessment & Plan Acute bilateral otitis media with effusion, acute otitis externa, bilateral ear pain Acute bilateral otitis externa and media with significant discomfort, more pronounced in the left ear. No antibiotic allergies reported. - Prescribed Augmentin  BID for 10 days. - Prescribed ear drops: 5 drops BID for both ears, with interval between applications. - Additional ear drops: 3 drops  TID for at least 5 days, continue if symptoms improve.     No orders of the defined types were placed in this encounter.    Meds ordered this encounter  Medications   amoxicillin -clavulanate (AUGMENTIN ) 875-125 MG tablet    Sig: Take 1 tablet by mouth 2 (two) times daily for 10 days.    Dispense:  20 tablet    Refill:  0   neomycin-polymyxin-hydrocortisone (CORTISPORIN) OTIC solution    Sig: Place 3 drops into both ears 3 (three) times daily for 5 days.    Dispense:  10 mL    Refill:  0    Return if symptoms worsen or fail to improve.  Corean LITTIE Ku, FNP

## 2024-08-04 NOTE — Patient Instructions (Signed)
 I have sent in Augmentin  for you to take twice a day for 10 days.  This medication can upset your stomach, so I tell everyone to take it with a meal.  I have sent an ear rubs for you to use 3 drops in the affected ear 3 times a day for at least 5 days.  Follow-up with me for new or worsening symptoms.

## 2024-08-08 NOTE — Telephone Encounter (Signed)
 Patient had appt with NP today for this issue

## 2024-09-05 ENCOUNTER — Other Ambulatory Visit: Payer: Self-pay | Admitting: Emergency Medicine

## 2024-09-05 DIAGNOSIS — E876 Hypokalemia: Secondary | ICD-10-CM

## 2024-09-10 ENCOUNTER — Other Ambulatory Visit: Payer: Self-pay | Admitting: Podiatry

## 2024-09-10 ENCOUNTER — Other Ambulatory Visit: Payer: Self-pay | Admitting: Family Medicine

## 2024-09-10 DIAGNOSIS — B9689 Other specified bacterial agents as the cause of diseases classified elsewhere: Secondary | ICD-10-CM

## 2024-09-14 ENCOUNTER — Telehealth: Payer: Self-pay

## 2024-09-14 NOTE — Telephone Encounter (Signed)
 Gave pt a call come up due for re-enrollment on [facility] [employer]  left a HIPAA VM

## 2024-10-09 NOTE — Telephone Encounter (Signed)
 Gave pt  2nd call, left a HIPAA VM mailing out pap [facility] ([facility] ) faxed provider portion.

## 2024-10-10 ENCOUNTER — Other Ambulatory Visit: Payer: Self-pay | Admitting: Emergency Medicine

## 2024-10-10 DIAGNOSIS — N1831 Chronic kidney disease, stage 3a: Secondary | ICD-10-CM

## 2024-11-10 ENCOUNTER — Other Ambulatory Visit: Payer: Self-pay | Admitting: Emergency Medicine

## 2024-11-10 DIAGNOSIS — I10 Essential (primary) hypertension: Secondary | ICD-10-CM

## 2025-01-15 ENCOUNTER — Ambulatory Visit: Admitting: Emergency Medicine

## 2025-01-19 ENCOUNTER — Ambulatory Visit
